# Patient Record
Sex: Male | Born: 1945 | Race: Black or African American | Hispanic: No | Marital: Married | Smoking: Never smoker
Health system: Southern US, Community
[De-identification: ages and names within clinical notes are randomized; demographics above are authoritative.]

## PROBLEM LIST (undated history)

## (undated) DIAGNOSIS — E7800 Pure hypercholesterolemia, unspecified: Secondary | ICD-10-CM

## (undated) DIAGNOSIS — C801 Malignant (primary) neoplasm, unspecified: Secondary | ICD-10-CM

## (undated) DIAGNOSIS — Z8546 Personal history of malignant neoplasm of prostate: Secondary | ICD-10-CM

## (undated) DIAGNOSIS — G4730 Sleep apnea, unspecified: Secondary | ICD-10-CM

## (undated) DIAGNOSIS — E119 Type 2 diabetes mellitus without complications: Secondary | ICD-10-CM

## (undated) DIAGNOSIS — J309 Allergic rhinitis, unspecified: Secondary | ICD-10-CM

## (undated) DIAGNOSIS — E785 Hyperlipidemia, unspecified: Secondary | ICD-10-CM

## (undated) DIAGNOSIS — A070 Balantidiasis: Secondary | ICD-10-CM

## (undated) DIAGNOSIS — I10 Essential (primary) hypertension: Secondary | ICD-10-CM

## (undated) HISTORY — DX: Hyperlipidemia, unspecified: E78.5

## (undated) HISTORY — PX: OTHER SURGICAL HISTORY: SHX169

## (undated) HISTORY — DX: Allergic rhinitis, unspecified: J30.9

## (undated) HISTORY — DX: Personal history of malignant neoplasm of prostate: Z85.46

## (undated) HISTORY — DX: Essential (primary) hypertension: I10

## (undated) HISTORY — DX: Pure hypercholesterolemia, unspecified: E78.00

## (undated) HISTORY — DX: Type 2 diabetes mellitus without complications: E11.9

## (undated) HISTORY — DX: Balantidiasis: A07.0

## (undated) HISTORY — DX: Sleep apnea, unspecified: G47.30

## (undated) HISTORY — DX: Malignant (primary) neoplasm, unspecified: C80.1

---

## 2000-05-19 ENCOUNTER — Ambulatory Visit (HOSPITAL_BASED_OUTPATIENT_CLINIC_OR_DEPARTMENT_OTHER): Admission: RE | Admit: 2000-05-19 | Discharge: 2000-05-19 | Payer: Self-pay | Admitting: Internal Medicine

## 2000-05-20 ENCOUNTER — Emergency Department (HOSPITAL_COMMUNITY): Admission: EM | Admit: 2000-05-20 | Discharge: 2000-05-20 | Payer: Self-pay | Admitting: Emergency Medicine

## 2000-05-20 ENCOUNTER — Encounter: Payer: Self-pay | Admitting: Emergency Medicine

## 2002-10-26 ENCOUNTER — Ambulatory Visit (HOSPITAL_COMMUNITY): Admission: RE | Admit: 2002-10-26 | Discharge: 2002-10-26 | Payer: Self-pay | Admitting: Family Medicine

## 2002-10-26 ENCOUNTER — Encounter: Payer: Self-pay | Admitting: Family Medicine

## 2004-04-01 ENCOUNTER — Emergency Department (HOSPITAL_COMMUNITY): Admission: EM | Admit: 2004-04-01 | Discharge: 2004-04-01 | Payer: Self-pay | Admitting: *Deleted

## 2004-04-08 ENCOUNTER — Emergency Department (HOSPITAL_COMMUNITY): Admission: EM | Admit: 2004-04-08 | Discharge: 2004-04-08 | Payer: Self-pay | Admitting: Family Medicine

## 2004-04-13 ENCOUNTER — Ambulatory Visit (HOSPITAL_COMMUNITY): Admission: RE | Admit: 2004-04-13 | Discharge: 2004-04-13 | Payer: Self-pay | Admitting: Urology

## 2004-05-03 ENCOUNTER — Ambulatory Visit (HOSPITAL_BASED_OUTPATIENT_CLINIC_OR_DEPARTMENT_OTHER): Admission: RE | Admit: 2004-05-03 | Discharge: 2004-05-03 | Payer: Self-pay | Admitting: Family Medicine

## 2004-07-20 ENCOUNTER — Ambulatory Visit (HOSPITAL_COMMUNITY): Admission: RE | Admit: 2004-07-20 | Discharge: 2004-07-20 | Payer: Self-pay | Admitting: Gastroenterology

## 2005-04-12 IMAGING — XA IR RENAL CYST ASP/INJ PROCEDURE
1 series · 2 of 2 positions shown · IV contrast (omnipaque)
Comparison: none

CLINICAL DATA: Symptomatic 8 cm right renal cyst.  Patient referred for aspiration and ablation of the cyst. 
1.  RIGHT RENAL CYST ASPIRATION AND ABLATION WITH CONTRAST INJECTION AND ULTRASOUND-GUIDED NEEDLE PUNCTURE 04/13/04.
2.  TRANSCATHETER RETRIEVAL OF FOREIGN BODY 04/13/04.
Contrast:  20 cc Omnipaque 300 injected into the right renal cyst. 
Medications:  6 mg IV Versed and 150 mcg IV fentanyl given during the procedure for sedation purposes.
The above listed procedures will be included in a single procedural note.
Preliminary review was performed of renal ultrasound studies performed at [REDACTED].  The patient was placed in a prone position and ultrasound localization of the right kidney performed.  The right flank and translumbar region were sterilely prepped and draped.  Local anesthesia was provided with 1% lidocaine.  Under direct ultrasound guidance, an 18 gauge trocar needle was advanced into a dominant right renal cyst with image documentation performed.  The guidewire was then advanced into the cyst and fluoroscopy performed.  The tract was dilated to 8 French, and an 8 French multipurpose locking pigtail catheter was advanced into the cyst.  The cyst was then aspirated completely.  Diluted contrast material was injected under fluoroscopy into the cyst after the cyst was aspirated.  50 cc of absolute ethanol was then injected slowly through the catheter back into the cyst.  The catheter was capped, and the patient was then turned on each side as well as both prone and supine positions for 5 minutes each.  He was then placed in a sitting upright position for 5 minutes.  The patient was then returned to a prone position and alcohol aspirated back through the catheter.  The catheter was then cut and a guidewire advanced.  Part of the catheter was then removed over a guidewire.  
A 10 French sheath was then advanced over the guidewire into the renal cyst.  Two different loop snare devices were utilized in snaring and retrieving broken catheter fragments under fluoroscopy.  Two separate loose catheter fragments were able to be retrieved successfully.  The outer sheath was removed.

[Series 1000: run · 0.13mm/px · 2 of 2 slices shown]
[im 1/2]
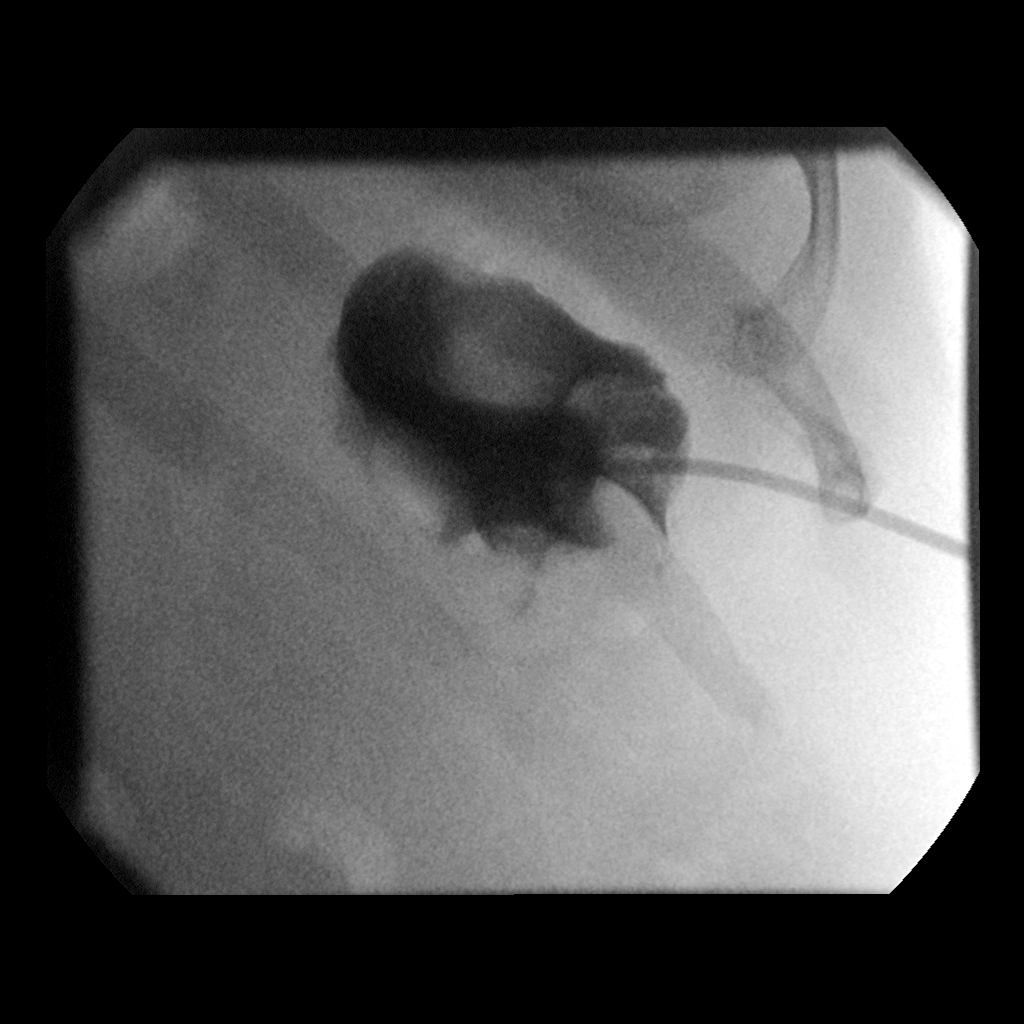
[im 2/2]
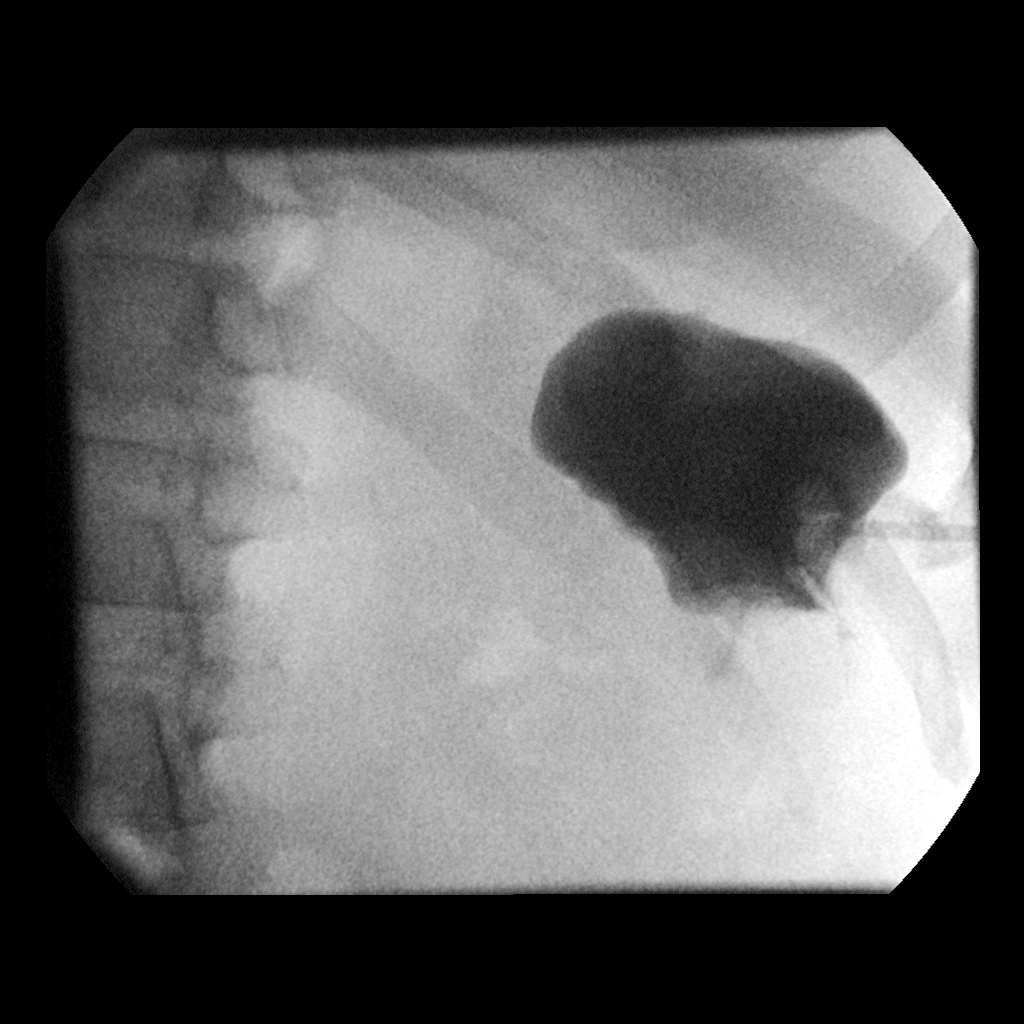

[2 of 2 positions shown; findings below may reference images not displayed]

FINDINGS: Ultrasound shows a dominant cyst of the right kidney which represents the dominant 8 cm cyst seen previously on diagnostic ultrasound.  After a catheter was advanced into the collection, aspiration yielded 150 cc of clear fluid.  Injection of contrast into the cyst shows irregular filling of a confined cavity without communication to the collecting system of the kidney or extravasation into the perinephric tissues.  50 cc of alcohol was therefore chosen to partially replace this volume.  All of the alcohol was able to be aspirated successfully.  When the catheter was removed over a guidewire, it was noted that there was resistance to removing the catheter, and the catheter then ruptured with fragments remaining in the renal cyst.  Two separate catheter fragments were able to be retrieved successfully with loop snare devices through the percutaneous tract.  No further foreign body is present in the cyst.  The patient will recover for three hours after the procedure.  
IMPRESSION
Right renal cyst aspiration and ablation as above.  Cyst volume was 150 cc.  Contrast injection showed no abnormal communication with the collection system or extravasation into perinephric tissues.  Ablation was performed with 50 cc of alcohol.  As above, the catheter placed did break upon attempting to remove it over a guidewire.  This is likely related to weakening of the catheter substance due to the presence of alcohol in the cyst for roughly 20 minutes.  Both catheter fragments were able to be retrieved utilizing snare devices through the percutaneous tract.

## 2006-02-19 ENCOUNTER — Ambulatory Visit: Admission: RE | Admit: 2006-02-19 | Discharge: 2006-04-21 | Payer: Self-pay | Admitting: Radiation Oncology

## 2010-12-30 ENCOUNTER — Encounter: Payer: Self-pay | Admitting: *Deleted

## 2012-10-26 ENCOUNTER — Other Ambulatory Visit: Payer: Self-pay | Admitting: Dermatology

## 2013-01-31 ENCOUNTER — Ambulatory Visit (HOSPITAL_BASED_OUTPATIENT_CLINIC_OR_DEPARTMENT_OTHER): Payer: 59 | Attending: Internal Medicine

## 2013-01-31 DIAGNOSIS — G4737 Central sleep apnea in conditions classified elsewhere: Secondary | ICD-10-CM | POA: Insufficient documentation

## 2013-01-31 DIAGNOSIS — G4733 Obstructive sleep apnea (adult) (pediatric): Secondary | ICD-10-CM | POA: Insufficient documentation

## 2013-02-06 DIAGNOSIS — G4733 Obstructive sleep apnea (adult) (pediatric): Secondary | ICD-10-CM

## 2013-02-06 DIAGNOSIS — R0609 Other forms of dyspnea: Secondary | ICD-10-CM

## 2013-02-06 DIAGNOSIS — G4737 Central sleep apnea in conditions classified elsewhere: Secondary | ICD-10-CM

## 2013-02-06 DIAGNOSIS — R0989 Other specified symptoms and signs involving the circulatory and respiratory systems: Secondary | ICD-10-CM

## 2013-02-06 NOTE — Procedures (Signed)
NAME:  Jason Salazar, Jason Salazar             ACCOUNT NO.:  [id number]  MEDICAL RECORD NO.:  [id number]          PATIENT TYPE:  OUT  LOCATION:  SLEEP CENTER                 FACILITY:  [facility]  PHYSICIAN:  Clinton D. Maple Hudson, MD, FCCP, FACPDATE OF BIRTH:  03-14-1947  DATE OF STUDY:  01/31/2013                           NOCTURNAL POLYSOMNOGRAM  REFERRING PHYSICIAN:  Fleet Contras, M.D.  INDICATION FOR STUDY:  Hypersomnia with sleep apnea.  EPWORTH SLEEPINESS SCORE:  7/24.  BMI 36, weight 210 pounds.  Height 64 inches.  Neck 18 inches.  MEDICATIONS:  Home medications are charted and reviewed.  SLEEP ARCHITECTURE:  Split study protocol.  During the diagnostic phase, total sleep time 127 minutes with sleep efficiency 87.3%.  Stage I was 22.4%, stage II 77.6%, stage III and REM were absent.  Sleep latency 9.5 minutes, awake after sleep onset 9 minutes.  Arousal index 9.  Bedtime Medication:  Veltin, lisinopril, Crestor, Dilantin.  RESPIRATORY DATA:  Split study protocol.  Apnea/hypopnea index (AHI) 123.3 per hour.  A total of 261 events was scored including 66 obstructive apneas, 102 central apneas, 40 mixed apneas, 53 hypopneas. Events were not positional.  CPAP was titrated to 13 CWP, AHI 0 per hour.  He wore a large Eson mask with humidifier.  OXYGEN DATA:  Moderate snoring before CPAP with oxygen desaturation to a nadir of 81% on room air.  With CPAP titration, snoring was prevented and mean oxygen saturation held 94.3% on room air.  CARDIAC DATA:  Normal sinus rhythm.  MOVEMENT/PARASOMNIA:  No significant movement disturbance. Bathroom x1.  IMPRESSION/RECOMMENDATION: 1. 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  Moderate snoring with     oxygen desaturation to a nadir of 81% on room air. 2. Successful CPAP titration to 13 CWP, AHI 0 per hour.  He wore a     large Eson mask with heated humidifier.  Snoring was prevented  and     mean oxygen saturation held at 94.3% on room air.     Clinton D. Maple Hudson, MD, [facility], FACP Diplomate, American Board of Sleep Medicine    CDY/MEDQ  D:  02/06/2013 10:43:33  T:  02/06/2013 22:19:06  Job:  [id number]

## 2013-05-17 ENCOUNTER — Encounter: Payer: Self-pay | Admitting: Internal Medicine

## 2013-05-17 ENCOUNTER — Ambulatory Visit (INDEPENDENT_AMBULATORY_CARE_PROVIDER_SITE_OTHER): Payer: 59 | Admitting: Internal Medicine

## 2013-05-17 VITALS — BP 132/84 | HR 85 | Ht 64.5 in | Wt 213.0 lb

## 2013-05-17 DIAGNOSIS — G4733 Obstructive sleep apnea (adult) (pediatric): Secondary | ICD-10-CM

## 2013-05-17 NOTE — Progress Notes (Signed)
05/17/13- 77 yoM never smoker referred courtesy of Dr.Avbuere because of obstructive sleep apnea. Originally diagnosed over 20 years ago and has had CPAP at least 10 years. Most recent NPSG 01/31/13- severe obstructive sleep apnea-AHI 123.3/ hr, CPAP to 13/ AHI 0. Weight 210 lbs. Currently CPAP is at 13/[facility], using a nasal mask and humidifier. At one time pressure was 15 and he thinks 13 may be too low. He denies daytime sleepiness now. Bedtime between 11 PM and midnight, sleep latency 10-15 minutes, waking once during the night before up between 4 and 6 AM. He denies history of heart or lung disease, or ENT surgery. Had seed implants for prostate cancer. Treated for allergic rhinitis. Brother with obstructive sleep apnea. Mother died with stroke.  Prior to Admission medications   Medication Sig Start Date End Date Taking? Authorizing Provider  aspirin 81 MG tablet Take 81 mg by mouth daily.   Yes Historical Provider, MD  Multiple Vitamin (MULTIVITAMIN) tablet Take 1 tablet by mouth daily.   Yes Historical Provider, MD  DILANTIN 100 MG ER capsule Take 4 capsules by mouth at bedtime. 04/11/13   Historical Provider, MD  glimepiride (AMARYL) 1 MG tablet Take 1 tablet by mouth 3 (three) times daily. 03/20/13   Historical Provider, MD  JANUMET 50-500 MG per tablet Take 1 tablet by mouth 2 (two) times daily. 05/07/13   Historical Provider, MD  lisinopril (PRINIVIL,ZESTRIL) 20 MG tablet Take 1 tablet by mouth daily. 05/07/13   Historical Provider, MD   Past Medical History  Diagnosis Date  . Hypertension   . Hyperlipidemia   . Cancer   . Allergic rhinitis   . Sleep apnea    No past surgical history on file. No family history on file.'  History   Social History  . Marital Status: Married    Spouse Name: N/A    Number of Children: N/A  . Years of Education: N/A   Occupational History  . Not on file.   Social History Main Topics  . Smoking status: Never Smoker   . Smokeless tobacco: Never Used   . Alcohol Use: No  . Drug Use: No  . Sexually Active: Not on file   Other Topics Concern  . Not on file   Social History Narrative  . No narrative on file   ROS-see HPI Constitutional:   No-   weight loss, night sweats, fevers, chills, fatigue, lassitude. HEENT:   No-  headaches, difficulty swallowing, tooth/dental problems, sore throat,       No-  sneezing, itching, ear ache, nasal congestion, post nasal drip,  CV:  No-   chest pain, orthopnea, PND, +swelling in lower extremities, anasarca,                                  dizziness, palpitations Resp: No-   shortness of breath with exertion or at rest.              No-   productive cough,  No non-productive cough,  No- coughing up of blood.              No-   change in color of mucus.  No- wheezing.   Skin: No-   rash or lesions. GI:  No-   heartburn, indigestion, abdominal pain, nausea, vomiting, diarrhea,                 change in bowel habits, loss  of appetite GU: No-   dysuria, change in color of urine, no urgency or frequency.  No- flank pain. MS:  No-   joint pain or swelling.  No- decreased range of motion.  No- back pain. Neuro-     nothing unusual Psych:  No- change in mood or affect. No depression or anxiety.  No memory loss.  OBJ- Physical Exam General- Alert, Oriented, Affect-appropriate, Distress- none acute. + obese Skin- rash-none, lesions- none, excoriation- none Lymphadenopathy- none Head- atraumatic            Eyes- Gross vision intact, PERRLA, conjunctivae and secretions clear            Ears- Hearing, canals-normal            Nose- Clear, no-Septal dev, mucus, polyps, erosion, perforation             Throat- Mallampati IV , mucosa clear , drainage- none, tonsils- atrophic Neck- flexible , trachea midline, no stridor , thyroid nl, carotid no bruit Chest - symmetrical excursion , unlabored           Heart/CV- RRR , no murmur , no gallop  , no rub, nl s1 s2                           - JVD- none , edema-  none, stasis changes- none, varices- none           Lung- clear to P&A, wheeze- none, cough- none , dullness-none, rub- none           Chest wall-  Abd- tender-no, distended-no, bowel sounds-present, HSM- no Br/ Gen/ Rectal- Not done, not indicated Extrem- cyanosis- none, clubbing, none, atrophy- none, strength- nl Neuro- grossly intact to observation

## 2013-05-17 NOTE — Patient Instructions (Addendum)
Order- DME [facility]  CPAP autotitrate 8-20 cwp x 7 days for pressure recommendation               Dx OSA  Please call as needed

## 2013-05-29 ENCOUNTER — Encounter: Payer: Self-pay | Admitting: Internal Medicine

## 2013-05-29 DIAGNOSIS — G4733 Obstructive sleep apnea (adult) (pediatric): Secondary | ICD-10-CM | POA: Insufficient documentation

## 2013-05-29 NOTE — Assessment & Plan Note (Signed)
Good compliance and control. We reviewed basic physiology of sleep apnea, medical concerns and most, and treatments. Plan-weight loss is encouraged. Safe driving his his responsibility as outlined. We will place order to auto titrate CPAP for pressure assessment.

## 2013-06-04 ENCOUNTER — Encounter: Payer: Self-pay | Admitting: Internal Medicine

## 2013-07-06 ENCOUNTER — Ambulatory Visit (INDEPENDENT_AMBULATORY_CARE_PROVIDER_SITE_OTHER): Payer: 59 | Admitting: Internal Medicine

## 2013-07-06 ENCOUNTER — Encounter: Payer: Self-pay | Admitting: Internal Medicine

## 2013-07-06 VITALS — BP 122/76 | HR 89 | Ht 64.5 in | Wt 212.0 lb

## 2013-07-06 DIAGNOSIS — G4733 Obstructive sleep apnea (adult) (pediatric): Secondary | ICD-10-CM

## 2013-07-06 NOTE — Progress Notes (Signed)
05/17/13- 24 yoM never smoker referred courtesy of Dr.Avbuere because of obstructive sleep apnea. Originally diagnosed over 20 years ago and has had CPAP at least 10 years. Most recent NPSG 01/31/13- severe obstructive sleep apnea-AHI 123.3/ hr, CPAP to 13/ AHI 0. Weight 210 lbs. Currently CPAP is at 13/[facility], using a nasal mask and humidifier. At one time pressure was 15 and he thinks 13 may be too low. He denies daytime sleepiness now. Bedtime between 11 PM and midnight, sleep latency 10-15 minutes, waking once during the night before up between 4 and 6 AM. He denies history of heart or lung disease, or ENT surgery. Had seed implants for prostate cancer. Treated for allergic rhinitis. Brother with obstructive sleep apnea. Mother died with stroke.  07-20-2013- 66 yoM never smoker followed for obstructive sleep apnea FOLLOWS FOR: wears CPAP every night for about 6-8 hours and pressure working well for patient; he is able to stay awake at work.  CPAP 13/ [facility] nasal mask and humidifier He realizes weight is a problem and has joined a gym.  ROS-see HPI Constitutional:   No-   weight loss, night sweats, fevers, chills, fatigue, lassitude. HEENT:   No-  headaches, difficulty swallowing, tooth/dental problems, sore throat,       No-  sneezing, itching, ear ache, nasal congestion, post nasal drip,  CV:  No-   chest pain, orthopnea, PND, swelling in lower extremities, anasarca,  dizziness, palpitations Resp: No-   shortness of breath with exertion or at rest.              No-   productive cough,  No non-productive cough,  No- coughing up of blood.              No-   change in color of mucus.  No- wheezing.   Skin: No-   rash or lesions. GI:  No-   heartburn, indigestion, abdominal pain, nausea, vomiting,  GU:  MS:  No-   joint pain or swelling.   Neuro-     nothing unusual Psych:  No- change in mood or affect. No depression or anxiety.  No memory loss.  OBJ- Physical Exam General- Alert, Oriented,  Affect-appropriate, Distress- none acute. + obese/ stocky Skin- rash-none, lesions- none, excoriation- none Lymphadenopathy- none Head- atraumatic            Eyes- Gross vision intact, PERRLA, conjunctivae and secretions clear            Ears- Hearing, canals-normal            Nose- Clear, no-Septal dev, mucus, polyps, erosion, perforation             Throat- Mallampati IV , mucosa clear , drainage- none, tonsils- atrophic Neck- flexible , trachea midline, no stridor , thyroid nl, carotid no bruit Chest - symmetrical excursion , unlabored           Heart/CV- RRR , no murmur , no gallop  , no rub, nl s1 s2                           - JVD- none , edema- none, stasis changes- none, varices- none           Lung- clear to P&A, wheeze- none, cough- none , dullness-none, rub- none           Chest wall-  Abd-  Br/ Gen/ Rectal- Not done, not indicated Extrem- cyanosis- none, clubbing, none, atrophy- none,  strength- nl Neuro- grossly intact to observation

## 2013-07-06 NOTE — Patient Instructions (Addendum)
We can continue CPAP 13/ [facility]  I strongly support your goal of weight loss

## 2013-07-18 ENCOUNTER — Encounter: Payer: Self-pay | Admitting: Internal Medicine

## 2013-07-18 NOTE — Assessment & Plan Note (Signed)
Good start back on CPAP 13/[facility]. We discussed comfort and communication with DME. Agreed that weight loss would help

## 2013-08-18 ENCOUNTER — Encounter: Payer: Self-pay | Admitting: Nurse Practitioner

## 2013-08-23 ENCOUNTER — Other Ambulatory Visit: Payer: Self-pay

## 2013-08-23 ENCOUNTER — Telehealth: Payer: Self-pay | Admitting: *Deleted

## 2013-08-23 MED ORDER — DILANTIN 100 MG PO CAPS: 400.0000 mg | ORAL_CAPSULE | Freq: Every day | ORAL | Status: DC

## 2013-08-23 NOTE — Telephone Encounter (Signed)
Pt emailed and was asking about any lab work and needing a refill.   No labs noted.  Please advise.   Appt next  10-04-13.  I sent email request.

## 2013-08-24 NOTE — Telephone Encounter (Signed)
According to centricity his last visit was October 2013. He can get his yearly labs at next  visit. Doesn't look like Dr. Marjory Lies called results last year.

## 2013-08-25 NOTE — Telephone Encounter (Signed)
I called and spoke to son, Donivan Scull, that refill for dilantin called in , and no labs needed until seen in the office for his RV.   If he has questions can call us back.  He would relay the message.

## 2013-10-04 ENCOUNTER — Encounter: Payer: Self-pay | Admitting: Nurse Practitioner

## 2013-10-04 ENCOUNTER — Encounter: Payer: Self-pay | Admitting: *Deleted

## 2013-10-04 ENCOUNTER — Ambulatory Visit (INDEPENDENT_AMBULATORY_CARE_PROVIDER_SITE_OTHER): Payer: 59 | Admitting: Nurse Practitioner

## 2013-10-04 VITALS — BP 130/73 | Ht 65.0 in | Wt 214.0 lb

## 2013-10-04 DIAGNOSIS — G40209 Localization-related (focal) (partial) symptomatic epilepsy and epileptic syndromes with complex partial seizures, not intractable, without status epilepticus: Secondary | ICD-10-CM

## 2013-10-04 DIAGNOSIS — A070 Balantidiasis: Secondary | ICD-10-CM | POA: Insufficient documentation

## 2013-10-04 DIAGNOSIS — Z8546 Personal history of malignant neoplasm of prostate: Secondary | ICD-10-CM | POA: Insufficient documentation

## 2013-10-04 DIAGNOSIS — E119 Type 2 diabetes mellitus without complications: Secondary | ICD-10-CM | POA: Insufficient documentation

## 2013-10-04 DIAGNOSIS — C801 Malignant (primary) neoplasm, unspecified: Secondary | ICD-10-CM | POA: Insufficient documentation

## 2013-10-04 DIAGNOSIS — E7800 Pure hypercholesterolemia, unspecified: Secondary | ICD-10-CM | POA: Insufficient documentation

## 2013-10-04 DIAGNOSIS — I10 Essential (primary) hypertension: Secondary | ICD-10-CM | POA: Insufficient documentation

## 2013-10-04 DIAGNOSIS — J309 Allergic rhinitis, unspecified: Secondary | ICD-10-CM | POA: Insufficient documentation

## 2013-10-04 DIAGNOSIS — Z5181 Encounter for therapeutic drug level monitoring: Secondary | ICD-10-CM | POA: Insufficient documentation

## 2013-10-04 DIAGNOSIS — Z79899 Other long term (current) drug therapy: Secondary | ICD-10-CM

## 2013-10-04 DIAGNOSIS — G40309 Generalized idiopathic epilepsy and epileptic syndromes, not intractable, without status epilepticus: Secondary | ICD-10-CM | POA: Insufficient documentation

## 2013-10-04 DIAGNOSIS — G4730 Sleep apnea, unspecified: Secondary | ICD-10-CM | POA: Insufficient documentation

## 2013-10-04 DIAGNOSIS — E782 Mixed hyperlipidemia: Secondary | ICD-10-CM | POA: Insufficient documentation

## 2013-10-04 DIAGNOSIS — I119 Hypertensive heart disease without heart failure: Secondary | ICD-10-CM | POA: Insufficient documentation

## 2013-10-04 DIAGNOSIS — E785 Hyperlipidemia, unspecified: Secondary | ICD-10-CM | POA: Insufficient documentation

## 2013-10-04 MED ORDER — DILANTIN 100 MG PO CAPS: 400.0000 mg | ORAL_CAPSULE | Freq: Every day | ORAL | Status: DC

## 2013-10-04 NOTE — Progress Notes (Signed)
[facility]  PATIENT: Jason Salazar DOB: October 05, 1946   REASON FOR VISIT: Followup for seizure disorder   HISTORY OF PRESENT ILLNESS: Ms. Jarnigan, 67 year old black male returns for followup. 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 No  seizures in several years.  He denies any missed doses of his medication. His seizure disorder began at age 7 following a head injury. He denies any side effects to his Dilantin. He denies any falls. He denies any daytime drowsiness. He also is diabetic but says his blood sugars are in good control. Last HgbA1C was 7.4.He gets no regular exercise. He also has a history of high blood pressure and sleep disorder for which he uses CPAP at 13 cm of pressure. Recent diagnosis of gout  REVIEW OF SYSTEMS: Full 14 system review of systems performed and notable only for:  Constitutional: Weight gain Cardiovascular: N/A  Ear/Nose/Throat: N/A  Skin: N/A  Eyes: N/A  Respiratory: Cough  Gastroitestinal: N/A  Hematology/Lymphatic: N/A  Endocrine: N/A Musculoskeletal:N/A  Allergy/Immunology: Allergies Neurological: N/A Psychiatric: N/A   ALLERGIES: No Known Allergies  HOME MEDICATIONS: Outpatient Prescriptions Prior to Visit  Medication Sig Dispense Refill  . aspirin 81 MG tablet Take 81 mg by mouth daily.      . CRESTOR 20 MG tablet Take 1 tablet by mouth every evening.      [name] DILANTIN 100 MG ER capsule Take 4 capsules (400 mg total) by mouth at bedtime.  120 capsule  1  . fluticasone (FLONASE) 50 MCG/ACT nasal spray       . glimepiride (AMARYL) 1 MG tablet Take 1 po QAM and 2 po QPM      . JANUMET 50-500 MG per tablet Take 1 tablet by mouth 2 (two) times daily.      [name] lisinopril (PRINIVIL,ZESTRIL) 20 MG tablet Take 1 tablet by mouth daily.      . Multiple Vitamin (MULTIVITAMIN) tablet Take 1 tablet by mouth daily.       No  facility-administered medications prior to visit.    PAST MEDICAL HISTORY: Past Medical History  Diagnosis Date  . Hypertension   . Hyperlipidemia   . Cancer   . Allergic rhinitis   . Sleep apnea   . H/O prostate cancer     Radioactive seeds    PAST SURGICAL HISTORY: History reviewed. No pertinent past surgical history.  FAMILY HISTORY: Family History  Problem Relation Age of Onset  . CVA Mother   . Sleep apnea Brother     SOCIAL HISTORY: History   Social History  . Marital Status: Married    Spouse Name: N/A    Number of Children: N/A  . Years of Education: N/A   Occupational History  . Not on file.   Social History Main Topics  . Smoking status: Never Smoker   . Smokeless tobacco: Never Used  . Alcohol Use: No  . Drug Use: No  . Sexual Activity: Not on file   Other Topics Concern  . Not on file   Social History Narrative   Patient is married to [place] and has one son.            PHYSICAL EXAM  Filed Vitals:   10/04/13 0901  BP: 130/73  Height: 5\' 5"  (1.651 m)  Weight: 214 lb (97.07 kg)   Body mass index is 35.61 kg/(m^2).  Generalized: Well developed, obese male  in no acute distress   Neurological examination   Mentation: Alert oriented to time, place, history taking. Follows all commands speech and language fluent  Cranial nerve II-XII: Pupils were equal round reactive to light extraocular movements were full, visual field were full on confrontational test. Facial sensation and strength were normal. hearing was intact to finger rubbing bilaterally. Uvula tongue midline. head turning and shoulder shrug and were normal and symmetric.Tongue protrusion into cheek strength was normal. Motor: normal bulk and tone, full strength in the BUE, BLE, fine finger movements normal, no pronator drift. No focal weakness Coordination: finger-nose-finger, heel-to-shin bilaterally, no dysmetria Reflexes: Brachioradialis 2/2, biceps 2/2, triceps 2/2,  patellar 2/2, Achilles 2/2, plantar responses were flexor bilaterally. Gait and Station: Rising up from seated position without assistance, normal stance, without trunk ataxia, moderate stride, good arm swing, smooth turning, able to perform tiptoe, and heel walking without difficulty. Tandem gait steady DIAGNOSTIC DATA (LABS, IMAGING, TESTING) -None to review  ASSESSMENT AND PLAN  67 y.o. year old male  has a past medical history of partial seizure disorder due to head trauma in the past with no seizures in several years. Patient is currently on Dilantin 400 mg daily without side effects.   Recent labs by Dr. Concepcion Elk, will check Dilantin level Given co pay card for BRAND Dilantin F/U yearly and prn  Nilda Riggs, [facility], [facility], APRN  [facility] [address] [place], [place] [address] [phone]

## 2013-10-04 NOTE — Patient Instructions (Signed)
Recent labs by Dr. Concepcion Elk, will check Dilantin level Given co pay card for BRAND Dilantin F/U yearly and prn

## 2013-10-05 ENCOUNTER — Ambulatory Visit (INDEPENDENT_AMBULATORY_CARE_PROVIDER_SITE_OTHER): Payer: 59 | Admitting: Interventional Cardiology

## 2013-10-05 ENCOUNTER — Encounter: Payer: Self-pay | Admitting: Interventional Cardiology

## 2013-10-05 VITALS — BP 150/74 | HR 91 | Ht 63.0 in | Wt 213.0 lb

## 2013-10-05 DIAGNOSIS — I10 Essential (primary) hypertension: Secondary | ICD-10-CM

## 2013-10-05 DIAGNOSIS — R0600 Dyspnea, unspecified: Secondary | ICD-10-CM | POA: Insufficient documentation

## 2013-10-05 DIAGNOSIS — G4733 Obstructive sleep apnea (adult) (pediatric): Secondary | ICD-10-CM

## 2013-10-05 DIAGNOSIS — R0609 Other forms of dyspnea: Secondary | ICD-10-CM

## 2013-10-05 DIAGNOSIS — E785 Hyperlipidemia, unspecified: Secondary | ICD-10-CM

## 2013-10-05 NOTE — Patient Instructions (Addendum)
Your physician recommends that you continue on your current medications as directed. Please refer to the Current Medication list given to you today.  Lab today: BNP  Your physician has requested that you have an echocardiogram. Echocardiography is a painless test that uses sound waves to create images of your heart. It provides your doctor with information about the size and shape of your heart and how well your heart's chambers and valves are working. This procedure takes approximately one hour. There are no restrictions for this procedure.

## 2013-10-05 NOTE — Progress Notes (Signed)
Patient ID: Jason Salazar, male   DOB: [birth year], 67 y.o.   MRN: [id number]   Date: 10/05/2013 ID: Jason Salazar, DOB 1946/04/23, MRN [id number] PCP: Dorrene German, MD  Reason: Peripheral edema, right lower extremity greater than left  ASSESSMENT;  1. Severe hypertension with poor control over the years. Currently reasonably well controlled  2. Right greater than left lower extremity edema 3. History of prostate cancer  PLAN:  1. 2-D Doppler echocardiogram 2. BNP   SUBJECTIVE: Jason Salazar is a 67 y.o. male who is concerned about right lower extremity swelling and dyspnea on exertion. He feels the dyspnea may be related to deconditioning. Right lower extremity edema worsens as the day progresses. It has resolved by the beginning of each morning. He denies orthopnea, PND, chest discomfort, and syncope. He has not felt palpitations. There no neurological complaints. He advocates medication compliance. He has gained weight. He is careful with salt in his diet. No history of DVT or pulmonary emboli.   No Known Allergies  Current Outpatient Prescriptions on File Prior to Visit  Medication Sig Dispense Refill  . allopurinol (ZYLOPRIM) 100 MG tablet       . aspirin 81 MG tablet Take 81 mg by mouth daily.      . CRESTOR 20 MG tablet Take 1 tablet by mouth every evening.      [name] DILANTIN 100 MG ER capsule Take 4 capsules (400 mg total) by mouth at bedtime.  120 capsule  11  . fluticasone (FLONASE) 50 MCG/ACT nasal spray       . glimepiride (AMARYL) 1 MG tablet Take 1 po QAM and 2 po QPM      . JANUMET 50-500 MG per tablet Take 1 tablet by mouth 2 (two) times daily.      [name] lisinopril (PRINIVIL,ZESTRIL) 20 MG tablet Take 1 tablet by mouth daily.      . Multiple Vitamin (MULTIVITAMIN) tablet Take 1 tablet by mouth daily.      [name] PROAIR HFA 108 (90 BASE) MCG/ACT inhaler       . TRAVATAN Z 0.004 % SOLN ophthalmic solution        No current facility-administered medications on file  prior to visit.    Past Medical History  Diagnosis Date  . Hypertension   . Hyperlipidemia   . Cancer     prostate cancer treated 2007 with radiation seed placement  . Allergic rhinitis   . Sleep apnea   . H/O prostate cancer     Radioactive seeds  . Diabetes   . Balantidiasis   . Hyperlipoproteinemia   . Hypercholesteremia     Past Surgical History  Procedure Laterality Date  . Cyst removed from right kidney      History   Social History  . Marital Status: Married    Spouse Name: N/A    Number of Children: N/A  . Years of Education: N/A   Occupational History  . Not on file.   Social History Main Topics  . Smoking status: Never Smoker   . Smokeless tobacco: Never Used  . Alcohol Use: No  . Drug Use: No  . Sexual Activity: Not on file   Other Topics Concern  . Not on file   Social History Narrative   Patient is married to Jason Salazar and has one son.           Family History  Problem Relation Age of Onset  . CVA Mother   . Sleep apnea Brother  ROS: No transient neurological symptoms, headache, blood in urine or stool, abdominal pain, syncope, wheezing, or hemoptysis.. Other systems negative for complaints.  OBJECTIVE: BP 150/74  Pulse 91  Ht 5\' 3"  (1.6 m)  Wt 213 lb (96.616 kg)  BMI 37.74 kg/m2,  General: No acute distress, obese, marked abdominal obesity HEENT: normal  Neck: JVD absent. Carotids absent Chest: Clear to auscultation and percussion Cardiac: Murmur: 2 of 6 systolic murmur left lower sternal border. Gallop: S4 gallop. Rhythm: Regular. Other: None Abdomen: Bruit: Absent. Pulsation: Absent. Liver and spleen not palpable Extremities: Edema: 1+ right lower extremity ankle edema. Pulses: 2+ and symmetric bilateral Neuro: Normal Psych: Normal  ECG: Biatrial abnormality, sinus rhythm, left ventricular hypertrophy

## 2013-10-06 ENCOUNTER — Telehealth: Payer: Self-pay

## 2013-10-06 LAB — BRAIN NATRIURETIC PEPTIDE: Pro B Natriuretic peptide (BNP): 23 pg/mL (ref 0.0–100.0)

## 2013-10-06 NOTE — Telephone Encounter (Signed)
Message copied by Jarvis Newcomer on Wed Oct 06, 2013  3:51 PM ------      Message from: Verdis Prime      Created: Wed Oct 06, 2013  2:49 PM       The blood test is normal, confirming that there is no evidence of fluid buildup in the lungs related to the heart. This also leads me to believe that the edema in the leg is not related to the heart. We will see the echo results and call with findings when available ------

## 2013-10-06 NOTE — Telephone Encounter (Signed)
called to give pt lab results lmom for pt to return call 

## 2013-10-07 NOTE — Progress Notes (Signed)
Quick Note:  Shared dilantin level results and dose instruction below with patient thru VM message. ______

## 2013-10-08 NOTE — Telephone Encounter (Signed)
2nd attempt lmom pt lab ok

## 2013-10-08 NOTE — Telephone Encounter (Signed)
returned pt call pt aware of labs.The blood test is normal. pt is to have echo we will call him with results.pt verbalized understanding

## 2013-10-08 NOTE — Telephone Encounter (Signed)
Message copied by Jarvis Newcomer on Fri Oct 08, 2013  8:57 AM ------      Message from: Verdis Prime      Created: Wed Oct 06, 2013  2:49 PM       The blood test is normal, confirming that there is no evidence of fluid buildup in the lungs related to the heart. This also leads me to believe that the edema in the leg is not related to the heart. We will see the echo results and call with findings when available ------

## 2013-10-08 NOTE — Telephone Encounter (Signed)
Message copied by Jarvis Newcomer on Fri Oct 08, 2013  8:50 AM ------      Message from: Verdis Prime      Created: Wed Oct 06, 2013  2:49 PM       The blood test is normal, confirming that there is no evidence of fluid buildup in the lungs related to the heart. This also leads me to believe that the edema in the leg is not related to the heart. We will see the echo results and call with findings when available ------

## 2013-10-08 NOTE — Telephone Encounter (Signed)
°  Patient is returning your call. Please call patient @ [phone]

## 2013-10-14 ENCOUNTER — Other Ambulatory Visit: Payer: Self-pay

## 2013-10-19 ENCOUNTER — Encounter: Payer: Self-pay | Admitting: Cardiology

## 2013-10-19 ENCOUNTER — Ambulatory Visit (HOSPITAL_COMMUNITY): Payer: 59 | Attending: Cardiology | Admitting: Radiology

## 2013-10-19 DIAGNOSIS — I10 Essential (primary) hypertension: Secondary | ICD-10-CM

## 2013-10-19 DIAGNOSIS — R0602 Shortness of breath: Secondary | ICD-10-CM

## 2013-10-19 DIAGNOSIS — G4730 Sleep apnea, unspecified: Secondary | ICD-10-CM | POA: Insufficient documentation

## 2013-10-19 DIAGNOSIS — R609 Edema, unspecified: Secondary | ICD-10-CM | POA: Insufficient documentation

## 2013-10-19 DIAGNOSIS — R0600 Dyspnea, unspecified: Secondary | ICD-10-CM

## 2013-10-19 DIAGNOSIS — E119 Type 2 diabetes mellitus without complications: Secondary | ICD-10-CM | POA: Insufficient documentation

## 2013-10-19 DIAGNOSIS — E785 Hyperlipidemia, unspecified: Secondary | ICD-10-CM | POA: Insufficient documentation

## 2013-10-19 NOTE — Progress Notes (Signed)
Echocardiogram performed.  

## 2013-10-22 ENCOUNTER — Other Ambulatory Visit: Payer: Self-pay | Admitting: *Deleted

## 2013-10-22 DIAGNOSIS — I10 Essential (primary) hypertension: Secondary | ICD-10-CM

## 2013-10-22 MED ORDER — LISINOPRIL-HYDROCHLOROTHIAZIDE 20-12.5 MG PO TABS: 1.0000 | ORAL_TABLET | Freq: Every day | ORAL | Status: DC

## 2013-10-22 NOTE — Telephone Encounter (Signed)
Pt notified of Echo results & new order to change lisinopril to lisinopril/HCT 20mg /12.5mg  oral daily plus return appointment w Dr Katrinka Blazing for f/u & BMET 11/23/13 @ 1115.

## 2013-11-18 NOTE — Progress Notes (Signed)
I reviewed note and agree with plan.   VIKRAM R. PENUMALLI, MD  Certified in Neurology, Neurophysiology and Neuroimaging  [facility] [address], [address] [place], [place] [address] [phone]   

## 2013-11-23 ENCOUNTER — Encounter: Payer: Self-pay | Admitting: Interventional Cardiology

## 2013-11-23 ENCOUNTER — Ambulatory Visit (INDEPENDENT_AMBULATORY_CARE_PROVIDER_SITE_OTHER): Payer: 59 | Admitting: Interventional Cardiology

## 2013-11-23 ENCOUNTER — Other Ambulatory Visit (INDEPENDENT_AMBULATORY_CARE_PROVIDER_SITE_OTHER): Payer: 59

## 2013-11-23 VITALS — BP 147/70 | HR 100 | Ht 62.0 in | Wt 204.0 lb

## 2013-11-23 DIAGNOSIS — I10 Essential (primary) hypertension: Secondary | ICD-10-CM

## 2013-11-23 DIAGNOSIS — E7800 Pure hypercholesterolemia, unspecified: Secondary | ICD-10-CM

## 2013-11-23 DIAGNOSIS — G4733 Obstructive sleep apnea (adult) (pediatric): Secondary | ICD-10-CM

## 2013-11-23 LAB — BASIC METABOLIC PANEL
Calcium: 9 mg/dL (ref 8.4–10.5)
Chloride: 101 mEq/L (ref 96–112)
Creatinine, Ser: 1.3 mg/dL (ref 0.4–1.5)
GFR: 72.97 mL/min (ref >60.00)
Glucose, Bld: 165 mg/dL — ABNORMAL HIGH (ref 70–99)
Potassium: 4.5 mEq/L (ref 3.5–5.1)
Sodium: 136 mEq/L (ref 135–145)

## 2013-11-23 NOTE — Patient Instructions (Signed)
Your physician recommends that you continue on your current medications as directed. Please refer to the Current Medication list given to you today.  Your physician wants you to follow-up in: 6 months You will receive a reminder letter in the mail two months in advance. If you don't receive a letter, please call our office to schedule the follow-up appointment.   2 Gram Low Sodium Diet A 2 gram sodium diet restricts the amount of sodium in the diet to no more than 2 g or 2000 mg daily. Limiting the amount of sodium is often used to help lower blood pressure. It is important if you have heart, liver, or kidney problems. Many foods contain sodium for flavor and sometimes as a preservative. When the amount of sodium in a diet needs to be low, it is important to know what to look for when choosing foods and drinks. The following includes some information and guidelines to help make it easier for you to adapt to a low sodium diet. QUICK TIPS  Do not add salt to food.  Avoid convenience items and fast food.  Choose unsalted snack foods.  Buy lower sodium products, often labeled as "lower sodium" or "no salt added."  Check food labels to learn how much sodium is in 1 serving.  When eating at a restaurant, ask that your food be prepared with less salt or none, if possible. READING FOOD LABELS FOR SODIUM INFORMATION The nutrition facts label is a good place to find how much sodium is in foods. Look for products with no more than 500 to 600 mg of sodium per meal and no more than 150 mg per serving. Remember that 2 g = 2000 mg. The food label may also list foods as:  Sodium-free: Less than 5 mg in a serving.  Very low sodium: 35 mg or less in a serving.  Low-sodium: 140 mg or less in a serving.  Light in sodium: 50% less sodium in a serving. For example, if a food that usually has 300 mg of sodium is changed to become light in sodium, it will have 150 mg of sodium.  Reduced sodium: 25% less  sodium in a serving. For example, if a food that usually has 400 mg of sodium is changed to reduced sodium, it will have 300 mg of sodium. CHOOSING FOODS Grains  Avoid: Salted crackers and snack items. Some cereals, including instant hot cereals. Bread stuffing and biscuit mixes. Seasoned rice or pasta mixes.  Choose: Unsalted snack items. Low-sodium cereals, oats, puffed wheat and rice, shredded wheat. English muffins and bread. Pasta. Meats  Avoid: Salted, canned, smoked, spiced, pickled meats, including fish and poultry. Bacon, ham, sausage, cold cuts, hot dogs, anchovies.  Choose: Low-sodium canned tuna and salmon. Fresh or frozen meat, poultry, and fish. Dairy  Avoid: Processed cheese and spreads. Cottage cheese. Buttermilk and condensed milk. Regular cheese.  Choose: Milk. Low-sodium cottage cheese. Yogurt. Sour cream. Low-sodium cheese. Fruits and Vegetables  Avoid: Regular canned vegetables. Regular canned tomato sauce and paste. Frozen vegetables in sauces. Olives. Pickles. Relishes. Sauerkraut.  Choose: Low-sodium canned vegetables. Low-sodium tomato sauce and paste. Frozen or fresh vegetables. Fresh and frozen fruit. Condiments  Avoid: Canned and packaged gravies. Worcestershire sauce. Tartar sauce. Barbecue sauce. Soy sauce. Steak sauce. Ketchup. Onion, garlic, and table salt. Meat flavorings and tenderizers.  Choose: Fresh and dried herbs and spices. Low-sodium varieties of mustard and ketchup. Lemon juice. Tabasco sauce. Horseradish. SAMPLE 2 GRAM SODIUM MEAL PLAN Breakfast / Sodium (  mg)  1 cup low-fat milk / 143 mg  2 slices whole-wheat toast / 270 mg  1 tbs heart-healthy margarine / 153 mg  1 hard-boiled egg / 139 mg  1 small orange / 0 mg Lunch / Sodium (mg)  1 cup raw carrots / 76 mg   cup hummus / 298 mg  1 cup low-fat milk / 143 mg   cup red grapes / 2 mg  1 whole-wheat pita bread / 356 mg Dinner / Sodium (mg)  1 cup whole-wheat pasta / 2  mg  1 cup low-sodium tomato sauce / 73 mg  3 oz lean ground beef / 57 mg  1 small side salad (1 cup raw spinach leaves,  cup cucumber,  cup yellow bell pepper) with 1 tsp olive oil and 1 tsp red wine vinegar / 25 mg Snack / Sodium (mg)  1 container low-fat vanilla yogurt / 107 mg  3 graham cracker squares / 127 mg Nutrient Analysis  Calories: 2033  Protein: 77 g  Carbohydrate: 282 g  Fat: 72 g  Sodium: 1971 mg Document Released: 11/25/2005 Document Revised: 02/17/2012 Document Reviewed: 02/26/2010 [employer] Patient Information 2014 [employer].  

## 2013-11-23 NOTE — Progress Notes (Signed)
Patient ID: Jason Salazar, male   DOB: [birth year], 67 y.o.   MRN: [id number]    [address] [place], [place]  [address] Phone: [phone] Fax:  [phone]  Date:  11/23/2013   ID:  Jason Salazar, DOB July 29, 1946, MRN [id number]  PCP:  Dorrene German, MD   ASSESSMENT:  1. Hypertension, poorly controlled but has not had his medications yet today 2. Chronic diastolic heart failure with dyspnea, improved after intensification of antihypertensive regimen. Heart rate is elevated today.  3. Obesity   PLAN:  1. continue current medical regimen 2. The blood pressure remains above 140 systolic or heart rate continues from around 100 beats per minute LAD low-dose beta blocker therapy 3. Clinical followup in 6 months   SUBJECTIVE: Jason Salazar is a 67 y.o. male who states that there has been a dramatic improvement in exertional tolerance and dyspnea on exertion. He has lost 10 pounds since starting low-dose hydrochlorothiazide. He denies chest pain. There is been no orthostatic dizziness. He denies peripheral edema, cramping, and gallop. Had previously used diuretic therapy but was discontinued because of gout.   Wt Readings from Last 3 Encounters:  11/23/13 204 lb (92.534 kg)  10/05/13 213 lb (96.616 kg)  10/04/13 214 lb (97.07 kg)     Past Medical History  Diagnosis Date  . Hypertension   . Hyperlipidemia   . Cancer     prostate cancer treated 2007 with radiation seed placement  . Allergic rhinitis   . Sleep apnea   . H/O prostate cancer     Radioactive seeds  . Diabetes   . Balantidiasis   . Hyperlipoproteinemia   . Hypercholesteremia     Current Outpatient Prescriptions  Medication Sig Dispense Refill  . allopurinol (ZYLOPRIM) 100 MG tablet       . aspirin 81 MG tablet Take 81 mg by mouth daily.      . CRESTOR 20 MG tablet Take 1 tablet by mouth every evening.      [name] DILANTIN 100 MG ER capsule Take 4 capsules (400 mg total) by mouth at  bedtime.  120 capsule  11  . fluticasone (FLONASE) 50 MCG/ACT nasal spray       . glimepiride (AMARYL) 1 MG tablet Take 1 po QAM and 2 po QPM      . JANUMET 50-500 MG per tablet Take 1 tablet by mouth 2 (two) times daily.      [name] lisinopril-hydrochlorothiazide (PRINZIDE,ZESTORETIC) 20-12.5 MG per tablet Take 1 tablet by mouth daily.  30 tablet  6  . Multiple Vitamin (MULTIVITAMIN) tablet Take 1 tablet by mouth daily.      [name] PROAIR HFA 108 (90 BASE) MCG/ACT inhaler       . TRAVATAN Z 0.004 % SOLN ophthalmic solution        No current facility-administered medications for this visit.    Allergies:   No Known Allergies  Social History:  The patient  reports that he has never smoked. He has never used smokeless tobacco. He reports that he does not drink alcohol or use illicit drugs.   ROS:  Please see the history of present illness.   All other systems reviewed and negative.   OBJECTIVE: VS:  BP 147/70  Pulse 100  Ht 5\' 2"  (1.575 m)  Wt 204 lb (92.534 kg)  BMI 37.30 kg/m2 Well nourished, well developed, in no acute distress, obese  HEENT: normal Neck: JVD flat. Carotid bruit absent  Cardiac:  normal S1,  S2; RRR; no murmur Lungs:  clear to auscultation bilaterally, no wheezing, rhonchi or rales Abd: soft, nontender, no hepatomegaly Ext: Edema absent. Pulses 2+  Skin: warm and dry Neuro:  CNs 2-12 intact, no focal abnormalities noted  EKG:  Not repeated       Signed, Darci Needle III, MD 11/23/2013 11:33 AM

## 2013-11-24 ENCOUNTER — Telehealth: Payer: Self-pay

## 2013-11-24 NOTE — Telephone Encounter (Signed)
pt given lab results.  Potassium and kidney function are normal after the medication adjustment. Continue the same therapy. pt verbalized understanding.

## 2013-11-24 NOTE — Telephone Encounter (Signed)
Message copied by Jarvis Newcomer on Wed Nov 24, 2013  3:56 PM ------      Message from: Verdis Prime      Created: Tue Nov 23, 2013  5:25 PM       Potassium and kidney function are normal after the medication adjustment. Continue the same therapy. ------

## 2014-01-06 ENCOUNTER — Encounter: Payer: Self-pay | Admitting: Internal Medicine

## 2014-01-06 ENCOUNTER — Ambulatory Visit (INDEPENDENT_AMBULATORY_CARE_PROVIDER_SITE_OTHER): Payer: 59 | Admitting: Internal Medicine

## 2014-01-06 VITALS — BP 110/58 | HR 99 | Ht 65.0 in | Wt 206.0 lb

## 2014-01-06 DIAGNOSIS — G4733 Obstructive sleep apnea (adult) (pediatric): Secondary | ICD-10-CM

## 2014-01-06 NOTE — Patient Instructions (Signed)
We can continue CPAP 13/ [facility]  Please call as needed

## 2014-01-06 NOTE — Progress Notes (Signed)
05/17/13- 33 yoM never smoker referred courtesy of Dr.Avbuere because of obstructive sleep apnea. Originally diagnosed over 20 years ago and has had CPAP at least 10 years. Most recent NPSG 01/31/13- severe obstructive sleep apnea-AHI 123.3/ hr, CPAP to 13/ [facility] 0. Weight 210 lbs. Currently CPAP is at 13/[facility], using a nasal mask and humidifier. At one time pressure was 15 and he thinks 13 may be too low. He denies daytime sleepiness now. Bedtime between 11 PM and midnight, sleep latency 10-15 minutes, waking once during the night before up between 4 and 6 AM. He denies history of heart or lung disease, or ENT surgery. Had seed implants for prostate cancer. Treated for allergic rhinitis. Brother with obstructive sleep apnea. Mother died with stroke.  01-Aug-2013- 17 yoM never smoker followed for obstructive sleep apnea FOLLOWS FOR: wears CPAP every night for about 6-8 hours and pressure working well for patient; he is able to stay awake at work.  CPAP 13/ [facility] nasal mask and humidifier He realizes weight is a problem and has joined a gym.  01/06/14- 67 yoM never smoker followed for obstructive sleep apnea CPAP 13/ [facility] nasal mask and humidifier FOLLOWS FOR:  Wearing CPAP 13/ [facility]  6-8 per night--no complaints and has received new supplies Just got new mask.  ROS-see HPI Constitutional:   No-   weight loss, night sweats, fevers, chills, fatigue, lassitude. HEENT:   No-  headaches, difficulty swallowing, tooth/dental problems, sore throat,       No-  sneezing, itching, ear ache, nasal congestion, post nasal drip,  CV:  No-   chest pain, orthopnea, PND, swelling in lower extremities, anasarca,  dizziness, palpitations Resp: No-   shortness of breath with exertion or at rest.              No-   productive cough,  No non-productive cough,  No- coughing up of blood.              No-   change in color of mucus.  No- wheezing.   Skin: No-   rash or lesions. GI:  No-   heartburn, indigestion, abdominal  pain, nausea, vomiting,  GU:  MS:  No-   joint pain or swelling.   Neuro-     nothing unusual Psych:  No- change in mood or affect. No depression or anxiety.  No memory loss.  OBJ- Physical Exam General- Alert, Oriented, Affect-appropriate, Distress- none acute. + obese/ stocky Skin- rash-none, lesions- none, excoriation- none Lymphadenopathy- none Head- atraumatic            Eyes- Gross vision intact, PERRLA, conjunctivae and secretions clear            Ears- Hearing, canals-normal            Nose- Clear, no-Septal dev, mucus, polyps, erosion, perforation             Throat- Mallampati IV , mucosa clear , drainage- none, tonsils- atrophic Neck- flexible , trachea midline, no stridor , thyroid nl, carotid no bruit Chest - symmetrical excursion , unlabored           Heart/CV- RRR , no murmur , no gallop  , no rub, nl s1 s2                           - JVD- none , edema- none, stasis changes- none, varices- none           Lung- clear  to P&A, wheeze- none, cough- none , dullness-none, rub- none           Chest wall-  Abd-  Br/ Gen/ Rectal- Not done, not indicated Extrem- cyanosis- none, clubbing, none, atrophy- none, strength- nl Neuro- grossly intact to observation

## 2014-01-17 ENCOUNTER — Telehealth: Payer: Self-pay | Admitting: Interventional Cardiology

## 2014-01-17 NOTE — Telephone Encounter (Signed)
New Prob   Pt has some questions regarding his BP. He states his BP was elevated this weekend, but says it has now resolved. He would like to speak to nurse regarding his concerns. Please call.

## 2014-01-17 NOTE — Telephone Encounter (Signed)
returned pt call .lmom for pt to call the office.

## 2014-01-17 NOTE — Telephone Encounter (Signed)
pt called pt sts that on Fri he went out to dinner and had a eal that was he believed to be high in sodium.pt sts that his bp had increased to 206/93.pt sts that he experinced some tingling in his left arm and hand. and thought he was having an onset of epileptic episode.pt sts that the tingling went away after 20 min.pt sts that his bp has gone back to baseline 120's/70's.pt adv to let us know if he has a reoccurrence of those symptoms.pt verbalized understanding

## 2014-01-24 ENCOUNTER — Telehealth: Payer: Self-pay | Admitting: Interventional Cardiology

## 2014-01-24 NOTE — Telephone Encounter (Signed)
New message     Patients bp is elevated.  No dizziness or headaches.  Pt has not missed any medication.  Wife called but want nurse to call her husband [phone]

## 2014-01-26 NOTE — Telephone Encounter (Signed)
called to ck on pt status.lmom for pt to call back

## 2014-01-30 NOTE — Assessment & Plan Note (Signed)
Good compliance and control with CPAP 13/[facility] Plan-contact [facility] for download for our record, reminder basic sleep hygiene

## 2014-02-10 NOTE — Telephone Encounter (Signed)
lmom. 2nd attempt called to see how pt was doing.

## 2014-02-11 ENCOUNTER — Telehealth: Payer: Self-pay | Admitting: Interventional Cardiology

## 2014-02-11 NOTE — Telephone Encounter (Signed)
Follow up ° ° ° ° ° °Returned Jason Salazar's call ° °

## 2014-02-11 NOTE — Telephone Encounter (Signed)
3rd attempt lmom for pt to call back with an update of how he is doing.

## 2014-02-11 NOTE — Telephone Encounter (Signed)
New message ° ° °Returning call back to nurse from yesterday.   °

## 2014-02-19 ENCOUNTER — Emergency Department (HOSPITAL_COMMUNITY): Payer: 59

## 2014-02-19 ENCOUNTER — Encounter (HOSPITAL_COMMUNITY): Payer: Self-pay | Admitting: Emergency Medicine

## 2014-02-19 ENCOUNTER — Emergency Department (HOSPITAL_COMMUNITY)
Admission: EM | Admit: 2014-02-19 | Discharge: 2014-02-19 | Disposition: A | Discharge status: Home / Self Care | Payer: 59 | Attending: Emergency Medicine | Admitting: Emergency Medicine

## 2014-02-19 DIAGNOSIS — R200 Anesthesia of skin: Secondary | ICD-10-CM

## 2014-02-19 DIAGNOSIS — I10 Essential (primary) hypertension: Secondary | ICD-10-CM | POA: Insufficient documentation

## 2014-02-19 DIAGNOSIS — Z8619 Personal history of other infectious and parasitic diseases: Secondary | ICD-10-CM | POA: Insufficient documentation

## 2014-02-19 DIAGNOSIS — Z79899 Other long term (current) drug therapy: Secondary | ICD-10-CM | POA: Insufficient documentation

## 2014-02-19 DIAGNOSIS — E785 Hyperlipidemia, unspecified: Secondary | ICD-10-CM | POA: Insufficient documentation

## 2014-02-19 DIAGNOSIS — E7800 Pure hypercholesterolemia, unspecified: Secondary | ICD-10-CM | POA: Insufficient documentation

## 2014-02-19 DIAGNOSIS — IMO0002 Reserved for concepts with insufficient information to code with codable children: Secondary | ICD-10-CM | POA: Insufficient documentation

## 2014-02-19 DIAGNOSIS — Z8546 Personal history of malignant neoplasm of prostate: Secondary | ICD-10-CM | POA: Insufficient documentation

## 2014-02-19 DIAGNOSIS — G459 Transient cerebral ischemic attack, unspecified: Secondary | ICD-10-CM | POA: Insufficient documentation

## 2014-02-19 DIAGNOSIS — Z7982 Long term (current) use of aspirin: Secondary | ICD-10-CM | POA: Insufficient documentation

## 2014-02-19 DIAGNOSIS — E119 Type 2 diabetes mellitus without complications: Secondary | ICD-10-CM | POA: Insufficient documentation

## 2014-02-19 DIAGNOSIS — G40909 Epilepsy, unspecified, not intractable, without status epilepticus: Secondary | ICD-10-CM | POA: Insufficient documentation

## 2014-02-19 LAB — CBC WITH DIFFERENTIAL/PLATELET
Basophils Absolute: 0 10*3/uL (ref 0.0–0.1)
Basophils Relative: 0 % (ref 0–1)
Eosinophils Absolute: 0 10*3/uL (ref 0.0–0.7)
Eosinophils Relative: 0 % (ref 0–5)
HEMATOCRIT: 39.9 % (ref 39.0–52.0)
Hemoglobin: 13.2 g/dL (ref 13.0–17.0)
LYMPHS PCT: 27 % (ref 12–46)
Lymphs Abs: 1.4 10*3/uL (ref 0.7–4.0)
MCH: 28.5 pg (ref 26.0–34.0)
MCHC: 33.1 g/dL (ref 30.0–36.0)
MCV: 86.2 fL (ref 78.0–100.0)
MONO ABS: 0.5 10*3/uL (ref 0.1–1.0)
Monocytes Relative: 10 % (ref 3–12)
Neutro Abs: 3.3 10*3/uL (ref 1.7–7.7)
Neutrophils Relative %: 62 % (ref 43–77)
Platelets: 180 10*3/uL (ref 150–400)
RBC: 4.63 MIL/uL (ref 4.22–5.81)
RDW: 13.7 % (ref 11.5–15.5)
WBC: 5.3 10*3/uL (ref 4.0–10.5)

## 2014-02-19 LAB — TROPONIN I: Troponin I: <0.3 ng/mL (ref <0.30)

## 2014-02-19 LAB — COMPREHENSIVE METABOLIC PANEL
ALT: 43 U/L (ref 0–53)
AST: 27 U/L (ref 0–37)
Albumin: 3.9 g/dL (ref 3.5–5.2)
Alkaline Phosphatase: 124 U/L — ABNORMAL HIGH (ref 39–117)
BUN: 26 mg/dL — ABNORMAL HIGH (ref 6–23)
CO2: 22 meq/L (ref 19–32)
Calcium: 8.9 mg/dL (ref 8.4–10.5)
Chloride: 101 mEq/L (ref 96–112)
Creatinine, Ser: 1.25 mg/dL (ref 0.50–1.35)
GFR calc non Af Amer: 58 mL/min — ABNORMAL LOW (ref >90)
GFR, EST AFRICAN AMERICAN: 68 mL/min — AB (ref >90)
GLUCOSE: 110 mg/dL — AB (ref 70–99)
Potassium: 4.7 mEq/L (ref 3.7–5.3)
Sodium: 139 mEq/L (ref 137–147)
Total Bilirubin: <0.2 mg/dL — ABNORMAL LOW (ref 0.3–1.2)
Total Protein: 6.9 g/dL (ref 6.0–8.3)

## 2014-02-19 LAB — PROTIME-INR
INR: 0.93 (ref 0.00–1.49)
PROTHROMBIN TIME: 12.3 s (ref 11.6–15.2)

## 2014-02-19 LAB — PHENYTOIN LEVEL, TOTAL: PHENYTOIN LVL: 18 ug/mL (ref 10.0–20.0)

## 2014-02-19 MED ORDER — CLOPIDOGREL BISULFATE 75 MG PO TABS: 75.0000 mg | ORAL_TABLET | Freq: Every day | ORAL | Status: DC

## 2014-02-19 NOTE — ED Notes (Signed)
The pt has had a numbness in his rt arm and hand  For 2 weeks intermttently.  He has seen his doctor for the same and had a c-t. 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  The pt  Woke up this am  With these symptoms

## 2014-02-19 NOTE — ED Provider Notes (Signed)
CSN: [id number]     Arrival date & time 02/19/14  0607 History   First MD Initiated Contact with Patient 02/19/14 [phone]     Chief Complaint  Patient presents with  . numbness rt arm and hand      (Consider location/radiation/quality/duration/timing/severity/associated sxs/prior Treatment) HPI Comments: Patient is a 68 year old male with history of hypertension, hypercholesterolemia, seizures. He presents today with complaints of intermittent numbness in his right arm and hand for the past 2 weeks. He denies any injury or trauma. He denies any headache or neck pain. He also states that his blood pressures have been running higher than normal. He was seen by his primary Dr. earlier this week. CT scan of the head was performed and was unremarkable and he was started on clonidine. He had another episode sometime in the night and woke with recurrent numbness in the right arm.   Patient is a 68 y.o. male presenting with weakness. The history is provided by the patient.  Weakness This is a new problem. The current episode started more than 1 week ago. Pertinent negatives include no headaches. Nothing aggravates the symptoms. Nothing relieves the symptoms. He has tried nothing for the symptoms.    Past Medical History  Diagnosis Date  . Hypertension   . Hyperlipidemia   . Cancer     prostate cancer treated 2007 with radiation seed placement  . Allergic rhinitis   . Sleep apnea   . H/O prostate cancer     Radioactive seeds  . Diabetes   . Balantidiasis   . Hyperlipoproteinemia   . Hypercholesteremia    Past Surgical History  Procedure Laterality Date  . Cyst removed from right kidney     Family History  Problem Relation Age of Onset  . CVA Mother   . Sleep apnea Brother    History  Substance Use Topics  . Smoking status: Never Smoker   . Smokeless tobacco: Never Used  . Alcohol Use: No    Review of Systems  Neurological: Positive for weakness. Negative for headaches.  All  other systems reviewed and are negative.      Allergies  Review of patient's allergies indicates no known allergies.  Home Medications   Current Outpatient Rx  Name  Route  Sig  Dispense  Refill  . allopurinol (ZYLOPRIM) 100 MG tablet   Oral   Take 100 mg by mouth daily.          [name] aspirin EC 81 MG tablet   Oral   Take 81 mg by mouth daily.         . cloNIDine (CATAPRES) 0.1 MG tablet   Oral   Take 0.1 mg by mouth daily.         . colchicine 0.6 MG tablet   Oral   Take 0.6 mg by mouth 2 (two) times daily.         [name] ezetimibe (ZETIA) 10 MG tablet   Oral   Take 10 mg by mouth daily.         . fluticasone (FLONASE) 50 MCG/ACT nasal spray   Each Nare   Place 1 spray into both nostrils daily.          [name] glimepiride (AMARYL) 1 MG tablet   Oral   Take 1-2 mg by mouth 2 (two) times daily. Take 1 tablet every morning and take 2 tablets every evening         . JANUMET 50-500 MG per tablet   Oral  Take 1 tablet by mouth 2 (two) times daily.         [name] lisinopril-hydrochlorothiazide (PRINZIDE,ZESTORETIC) 20-12.5 MG per tablet   Oral   Take 1 tablet by mouth daily.   30 tablet   6   . phenytoin (DILANTIN) 100 MG ER capsule   Oral   Take 400 mg by mouth at bedtime.         . TRAVATAN Z 0.004 % SOLN ophthalmic solution   Both Eyes   Place 1 drop into both eyes at bedtime.           BP 123/68  Pulse 91  Resp 15  SpO2 97% Physical Exam  Nursing note and vitals reviewed. Constitutional: He is oriented to person, place, and time. He appears well-developed and well-nourished. No distress.  HENT:  Head: Normocephalic and atraumatic.  Mouth/Throat: Oropharynx is clear and moist.  Eyes: EOM are normal. Pupils are equal, round, and reactive to light.  Neck: Normal range of motion. Neck supple.  Cardiovascular: Normal rate, regular rhythm and normal heart sounds.   No murmur heard. Pulmonary/Chest: Effort normal and breath sounds normal. No  respiratory distress. He has no wheezes.  Abdominal: Soft. Bowel sounds are normal. He exhibits no distension. There is no tenderness.  Musculoskeletal: Normal range of motion. He exhibits no edema.  Lymphadenopathy:    He has no cervical adenopathy.  Neurological: He is alert and oriented to person, place, and time. No cranial nerve deficit. He exhibits normal muscle tone. Coordination normal.  Skin: Skin is warm and dry. He is not diaphoretic.    ED Course  Procedures (including critical care time) Labs Review Labs Reviewed  CBC WITH DIFFERENTIAL  COMPREHENSIVE METABOLIC PANEL  PHENYTOIN LEVEL, TOTAL  TROPONIN I  PROTIME-INR   Imaging Review No results found.   EKG Interpretation   Date/Time:  Saturday February 19 2014 06:14:36 EDT Ventricular Rate:  94 PR Interval:  172 QRS Duration: 84 QT Interval:  332 QTC Calculation: 415 R Axis:   83 Text Interpretation:  Normal sinus rhythm Right atrial enlargement Septal  infarct , age undetermined ST \\T \ T wave abnormality, consider lateral  ischemia Abnormal ECG Unchanged when compared with prior ecg Confirmed by  DELOS  MD, Mayzee Reichenbach ([id number]) on 02/19/2014 7:29:42 AM      MDM   Final diagnoses:  None    Patient is a 68 year old male with history of hypertension and diabetes. He presents today with complaints of intermittent right arm numbness that has been coming and going for the past 1-2 weeks. He had a CT scan last week which was unremarkable. Physical examination today reveals a nonfocal neurologic exam and laboratory studies are unremarkable. MRI of the brain does reveal chronic changes but no acute findings of a stroke. I've discussed the case with Dr. Doy Mince from neurology who recommends the addition of Plavix. This will be prescribed and the patient will be advised to followup with his primary Dr. this week upcoming. He understands to return if his symptoms substantially worsen or change. He remains neurologically intact  throughout his emergency department stay and he is currently symptom-free at the time of discharge.    Veryl Speak, MD 02/19/14 1050

## 2014-02-19 NOTE — Discharge Instructions (Signed)
Begin taking Plavix as prescribed in addition to your other medications.  Followup with your primary Dr. this upcoming week for a recheck.  Return to the emergency department if your symptoms substantially worsen or change.   Transient Ischemic Attack A transient ischemic attack (TIA) is a "warning stroke" that causes stroke-like symptoms. Unlike a stroke, a TIA does not cause permanent damage to the brain. The symptoms of a TIA can happen very fast and do not last long. It is important to know the symptoms of a TIA and what to do. This can help prevent a major stroke or death. CAUSES   A TIA is caused by a temporary blockage in an artery in the brain or neck (carotid artery). The blockage does not allow the brain to get the blood supply it needs and can cause different symptoms. The blockage can be caused by either:  A blood clot.  Fatty buildup (plaque) in a neck or brain artery. RISK FACTORS  High blood pressure (hypertension).  High cholesterol.  Diabetes mellitus.  Heart disease.  The build up of plaque in the blood vessels (peripheral artery disease or atherosclerosis).  The build up of plaque in the blood vessels providing blood and oxygen to the brain (carotid artery stenosis).  An abnormal heart rhythm (atrial fibrillation).  Obesity.  Smoking.  Taking oral contraceptives (especially in combination with smoking).  Physical inactivity.  A diet high in fats, salt (sodium), and calories.  Alcohol use.  Use of illegal drugs (especially cocaine and methamphetamine).  Being male.  Being African American.  Being over the age of 82.  Family history of stroke.  Previous history of blood clots, stroke, TIA, or heart attack.  Sickle cell disease. SYMPTOMS  TIA symptoms are the same as a stroke but are temporary. These symptoms usually develop suddenly, or may be newly present upon awakening from sleep:  Sudden weakness or numbness of the face, arm, or leg,  especially on one side of the body.  Sudden trouble walking or difficulty moving arms or legs.  Sudden confusion.  Sudden personality changes.  Trouble speaking (aphasia) or understanding.  Difficulty swallowing.  Sudden trouble seeing in one or both eyes.  Double vision.  Dizziness.  Loss of balance or coordination.  Sudden severe headache with no known cause.  Trouble reading or writing.  Loss of bowel or bladder control.  Loss of consciousness. DIAGNOSIS  Your caregiver may be able to determine the presence or absence of a TIA based on your symptoms, history, and physical exam. Computed tomography (CT scan) of the brain is usually performed to help identify a TIA. Other tests may be done to diagnose a TIA. These tests may include:  Electrocardiography.  Continuous heart monitoring.  Echocardiography.  Carotid ultrasonography.  Magnetic resonance imaging (MRI).  A scan of the brain circulation.  Blood tests. PREVENTION  The risk of a TIA can be decreased by appropriately treating high blood pressure, high cholesterol, diabetes, heart disease, and obesity and by quitting smoking, limiting alcohol, and staying physically active. TREATMENT  Time is of the essence. Since the symptoms of TIA are the same as a stroke, it is important to seek treatment within 3 4 hours of the start of symptoms because you may receive a medicine to dissolve the clot (thrombolytic) that cannot be given after that time. Treatment options vary. Treatment options may include rest, oxygen, intravenous (IV) fluids, and medicines to thin the blood (anticoagulants). Medicines and diet may be used to address  diabetes, high blood pressure, and other risk factors. Measures will be taken to prevent short-term and long-term complications, including infection from breathing foreign material into the lungs (aspiration pneumonia), blood clots in the legs, and falls. Treatment options include procedures to  either remove plaque in the carotid arteries or dilate carotid arteries that have narrowed due to plaque. Those procedures are:  Carotid endarterectomy.  Carotid angioplasty and stenting. HOME CARE INSTRUCTIONS   Take all medicines prescribed by your caregiver. Follow the directions carefully. Medicines may be used to control risk factors for a stroke. Be sure you understand all your medicine instructions.  You may be told to take aspirin or the anticoagulant warfarin. Warfarin needs to be taken exactly as instructed.  Taking too much or too little warfarin is dangerous. Too much warfarin increases the risk of bleeding. Too little warfarin continues to allow the risk for blood clots. While taking warfarin, you will need to have regular blood tests to measure your blood clotting time. A PT blood test measures how long it takes for blood to clot. Your PT is used to calculate another value called an INR. Your PT and INR help your caregiver to adjust your dose of warfarin. The dose can change for many reasons. It is critically important that you take warfarin exactly as prescribed.  Many foods, especially foods high in vitamin K can interfere with warfarin and affect the PT and INR. Foods high in vitamin K include spinach, kale, broccoli, cabbage, collard and turnip greens, brussels sprouts, peas, cauliflower, seaweed, and parsley as well as beef and pork liver, green tea, and soybean oil. You should eat a consistent amount of foods high in vitamin K. Avoid major changes in your diet, or notify your caregiver before changing your diet. Arrange a visit with a dietitian to answer your questions.  Many medicines can interfere with warfarin and affect the PT and INR. You must tell your caregiver about any and all medicines you take, this includes all vitamins and supplements. Be especially cautious with aspirin and anti-inflammatory medicines. Do not take or discontinue any prescribed or over-the-counter  medicine except on the advice of your caregiver or pharmacist.  Warfarin can have side effects, such as excessive bruising or bleeding. You will need to hold pressure over cuts for longer than usual. Your caregiver or pharmacist will discuss other potential side effects.  Avoid sports or activities that may cause injury or bleeding.  Be mindful when shaving, flossing your teeth, or handling sharp objects.  Alcohol can change the body's ability to handle warfarin. It is best to avoid alcoholic drinks or consume only very small amounts while taking warfarin. Notify your caregiver if you change your alcohol intake.  Notify your dentist or other caregivers before procedures.  Eat a diet that includes 5 or more servings of fruits and vegetables each day. This may reduce the risk of stroke. Certain diets may be prescribed to address high blood pressure, high cholesterol, diabetes, or obesity.  A low-sodium, low-saturated fat, low-trans fat, low-cholesterol diet is recommended to manage high blood pressure.  A low-saturated fat, low-trans fat, low-cholesterol, and high-fiber diet may control cholesterol levels.  A controlled-carbohydrate, controlled-sugar diet is recommended to manage diabetes.  A reduced-calorie, low-sodium, low-saturated fat, low-trans fat, low-cholesterol diet is recommended to manage obesity.  Maintain a healthy weight.  Stay physically active. It is recommended that you get at least 30 minutes of activity on most or all days.  Do not smoke.  Limit alcohol  use even if you are not taking warfarin. Moderate alcohol use is considered to be:  No more than 2 drinks each day for men.  No more than 1 drink each day for nonpregnant women.  Stop drug abuse.  Home safety. A safe home environment is important to reduce the risk of falls. Your caregiver may arrange for specialists to evaluate your home. Having grab bars in the bedroom and bathroom is often important. Your  caregiver may arrange for equipment to be used at home, such as raised toilets and a seat for the shower.  Follow all instructions for follow-up with your caregiver. This is very important. This includes any referrals and lab tests. Proper follow up can prevent a stroke or another TIA from occurring. SEEK MEDICAL CARE IF:  You have personality changes.  You have difficulty swallowing.  You are seeing double.  You have dizziness.  You have a fever.  You have skin breakdown. SEEK IMMEDIATE MEDICAL CARE IF:  Any of these symptoms may represent a serious problem that is an emergency. Do not wait to see if the symptoms will go away. Get medical help right away. Call your local emergency services (911 in [place] Do not drive yourself to the hospital.  You have sudden weakness or numbness of the face, arm, or leg, especially on one side of the body.  You have sudden trouble walking or difficulty moving arms or legs.  You have sudden confusion.  You have trouble speaking (aphasia) or understanding.  You have sudden trouble seeing in one or both eyes.  You have a loss of balance or coordination.  You have a sudden, severe headache with no known cause.  You have new chest pain or an irregular heartbeat.  You have a partial or total loss of consciousness. MAKE SURE YOU:   Understand these instructions.  Will watch your condition.  Will get help right away if you are not doing well or get worse. Document Released: 09/04/2005 Document Revised: 11/11/2012 Document Reviewed: 01/18/2010 [facility] Patient Information 2014 [place].

## 2014-02-21 ENCOUNTER — Other Ambulatory Visit (HOSPITAL_COMMUNITY): Payer: Self-pay | Admitting: Internal Medicine

## 2014-02-21 DIAGNOSIS — I639 Cerebral infarction, unspecified: Secondary | ICD-10-CM

## 2014-02-23 ENCOUNTER — Ambulatory Visit (HOSPITAL_COMMUNITY)
Admission: RE | Admit: 2014-02-23 | Discharge: 2014-02-23 | Disposition: A | Discharge status: Home / Self Care | Payer: 59 | Source: Ambulatory Visit | Attending: Internal Medicine | Admitting: Internal Medicine

## 2014-02-23 DIAGNOSIS — R209 Unspecified disturbances of skin sensation: Secondary | ICD-10-CM | POA: Insufficient documentation

## 2014-02-23 DIAGNOSIS — I639 Cerebral infarction, unspecified: Secondary | ICD-10-CM

## 2014-02-23 NOTE — Progress Notes (Signed)
*  PRELIMINARY RESULTS* Vascular Ultrasound Carotid Duplex (Doppler) has been completed.  Preliminary findings: Bilateral:  1-39% ICA stenosis.  Vertebral artery flow is antegrade.      Landry Mellow, RDMS, RVT  02/23/2014, 1:11 PM

## 2014-02-25 ENCOUNTER — Encounter: Payer: Self-pay | Admitting: Diagnostic Neuroimaging

## 2014-02-25 ENCOUNTER — Ambulatory Visit (INDEPENDENT_AMBULATORY_CARE_PROVIDER_SITE_OTHER): Payer: 59 | Admitting: Diagnostic Neuroimaging

## 2014-02-25 VITALS — BP 121/68 | HR 81 | Temp 98.6°F | Ht 63.0 in | Wt 199.0 lb

## 2014-02-25 DIAGNOSIS — R209 Unspecified disturbances of skin sensation: Secondary | ICD-10-CM

## 2014-02-25 DIAGNOSIS — G459 Transient cerebral ischemic attack, unspecified: Secondary | ICD-10-CM

## 2014-02-25 DIAGNOSIS — R200 Anesthesia of skin: Secondary | ICD-10-CM

## 2014-02-25 DIAGNOSIS — G40209 Localization-related (focal) (partial) symptomatic epilepsy and epileptic syndromes with complex partial seizures, not intractable, without status epilepticus: Secondary | ICD-10-CM

## 2014-02-25 DIAGNOSIS — R202 Paresthesia of skin: Secondary | ICD-10-CM

## 2014-02-25 NOTE — Patient Instructions (Signed)
Continue aspirin and plavix for 3 months, then stop aspirin.  Continue medications for hypertension, diabetes and cholesterol.  I will check nerve testing and CT angio (head/neck).

## 2014-02-25 NOTE — Progress Notes (Signed)
[facility]  PATIENT: Jason Salazar DOB: September 29, 1946  REFERRING CLINICIAN: Avbere HISTORY FROM: patient and daughter REASON FOR VISIT: new consult   HISTORICAL  CHIEF COMPLAINT:  Chief Complaint  Patient presents with  . Cerebrovascular Accident    HISTORY OF PRESENT ILLNESS:   68 year old right-handed male with hypertension, diabetes, hyperkalemia, prostate cancer, here for evaluation of TIA.  02/05/2014, patient had a 10-20 minute episode of left arm numbness and tingling. Patient's symptoms were quite severe and they called EMS. Upon arrival his blood pressure was 200/150. Symptoms resolved and he did not go to the hospital. Over the next few days patient noted variable blood pressures sometimes higher in the right arm, sometimes higher in the left arm. He also had a alternating numbness sensation in either the right or left arm. He had a particularly severe at event on 02/19/14, and therefore went to the emergency room. Patient had MRI of the brain which showed no acute findings. Patient had been taking aspirin. Plavix was added on in the emergency room and patient was discharged for further evaluation.  Since that time patient has continued to have intermittent episodes of numbness in either the right or left arm, lasting for a few minutes at a time.  Separately patient has a long history of seizure disorder from age 65 years old. Probably patient fell from a tree and ever since that time he had grand mal seizures. Typical seizures involve drawing sensation in the left hand, followed by convulsions of the left arm and then spreading generalized grand mal seizures. Loss of consciousness and tongue biting with incontinence have occurred. His last seizure was in 2011 which was a partial seizure involving the left hand. He has had about 2 or 3 seizures in the last 19 years. Patient was started on Dilantin age 73 years old. He's done quite well on this over many  years. He is seen by our nurse practitioner Cecille Rubin in our practice.  REVIEW OF SYSTEMS: Full 14 system review of systems performed and notable only for numbness snoring on CPAP, seizure joint pain incontinence ringing in ears murmur 18 pound intentional weight loss over the past one month.  ALLERGIES: No Known Allergies  HOME MEDICATIONS: Outpatient Prescriptions Prior to Visit  Medication Sig Dispense Refill  . allopurinol (ZYLOPRIM) 100 MG tablet Take 100 mg by mouth daily.       [name] aspirin EC 81 MG tablet Take 81 mg by mouth daily.      . cloNIDine (CATAPRES) 0.1 MG tablet Take 0.1 mg by mouth daily.      . clopidogrel (PLAVIX) 75 MG tablet Take 1 tablet (75 mg total) by mouth daily with breakfast.  30 tablet  0  . colchicine 0.6 MG tablet Take 0.6 mg by mouth 2 (two) times daily.      [name] ezetimibe (ZETIA) 10 MG tablet Take 10 mg by mouth daily.      . fluticasone (FLONASE) 50 MCG/ACT nasal spray Place 1 spray into both nostrils daily.       [name] glimepiride (AMARYL) 1 MG tablet Take 1-2 mg by mouth 2 (two) times daily. Take 1 tablet every morning and take 2 tablets every evening      . JANUMET 50-500 MG per tablet Take 1 tablet by mouth 2 (two) times daily.      [name] lisinopril-hydrochlorothiazide (PRINZIDE,ZESTORETIC) 20-12.5 MG per tablet Take 1 tablet by mouth daily.  30 tablet  6  . phenytoin (DILANTIN) 100 MG  ER capsule Take 400 mg by mouth at bedtime.      . TRAVATAN Z 0.004 % SOLN ophthalmic solution Place 1 drop into both eyes at bedtime.        No facility-administered medications prior to visit.    PAST MEDICAL HISTORY: Past Medical History  Diagnosis Date  . Hypertension   . Hyperlipidemia   . Cancer     prostate cancer treated 2007 with radiation seed placement  . Allergic rhinitis   . Sleep apnea   . H/O prostate cancer     Radioactive seeds  . Diabetes   . Balantidiasis   . Hyperlipoproteinemia   . Hypercholesteremia     PAST SURGICAL HISTORY: Past  Surgical History  Procedure Laterality Date  . Cyst removed from right kidney      FAMILY HISTORY: Family History  Problem Relation Age of Onset  . CVA Mother   . Sleep apnea Brother   . Thyroid cancer Father   . Prostate cancer Father     SOCIAL HISTORY:  History   Social History  . Marital Status: Married    Spouse Name: [name]    Number of Children: 1  . Years of Education: HS   Occupational History  .  [employer]   Social History Main Topics  . Smoking status: Never Smoker   . Smokeless tobacco: Never Used  . Alcohol Use: No  . Drug Use: No  . Sexual Activity: Not on file   Other Topics Concern  . Not on file   Social History Narrative   Patient is married to [place] and has one son.   Caffeine Use: none; quit a few months ago               PHYSICAL EXAM  Filed Vitals:   02/25/14 0927  BP: 121/68  Pulse: 81  Temp: 98.6 F (37 C)  TempSrc: Oral  Height: 5\' 3"  (1.6 m)  Weight: 199 lb (90.266 kg)    Not recorded    Body mass index is 35.26 kg/(m^2).  GENERAL EXAM: Patient is in no distress; well developed, nourished and groomed; neck is supple  CARDIOVASCULAR: Regular rate and rhythm, no murmurs, no carotid bruits; LEFT RADIAL PULSE SLIGHTLY DECREASED COMPARED TO RIGHT SIDE. NO SUPRACLAVICULAR BRUITS.  -BP IN RIGHT ARM (111/68) -BP IN LEFT ARM (104/65)  NEUROLOGIC: MENTAL STATUS: awake, alert, oriented to person, place and time, recent and remote memory intact, normal attention and concentration, language fluent, comprehension intact, naming intact, fund of knowledge appropriate CRANIAL NERVE: no papilledema on fundoscopic exam, pupils equal and reactive to light, visual fields full to confrontation, extraocular muscles intact, no nystagmus, facial sensation and strength symmetric, hearing intact, palate elevates symmetrically, uvula midline, shoulder shrug symmetric, tongue midline. MOTOR: normal bulk and tone, full strength in  the BUE, BLE SENSORY: normal and symmetric to light touch, pinprick, temperature, vibration; VIB 3 SEC AT TOES. COORDINATION: finger-nose-finger, fine finger movements normal REFLEXES: BUE 1, KNEES TRACE, ANKLES 0 GAIT/STATION: narrow based gait; able to walk on toes, heels and tandem; romberg is negative    DIAGNOSTIC DATA (LABS, IMAGING, TESTING) - I reviewed patient records, labs, notes, testing and imaging myself where available.  Lab Results  Component Value Date   WBC 5.3 02/19/2014   HGB 13.2 02/19/2014   HCT 39.9 02/19/2014   MCV 86.2 02/19/2014   PLT 180 02/19/2014      Component Value Date/Time   NA 139 02/19/2014 0843   K  4.7 02/19/2014 0843   CL 101 02/19/2014 0843   CO2 22 02/19/2014 0843   GLUCOSE 110* 02/19/2014 0843   BUN 26* 02/19/2014 0843   CREATININE 1.25 02/19/2014 0843   CALCIUM 8.9 02/19/2014 0843   PROT 6.9 02/19/2014 0843   ALBUMIN 3.9 02/19/2014 0843   AST 27 02/19/2014 0843   ALT 43 02/19/2014 0843   ALKPHOS 124* 02/19/2014 0843   BILITOT <0.2* 02/19/2014 0843   GFRNONAA 58* 02/19/2014 0843   GFRAA 68* 02/19/2014 0843   No results found for this basename: CHOL, HDL, LDLCALC, LDLDIRECT, TRIG, CHOLHDL   No results found for this basename: HGBA1C   No results found for this basename: VITAMINB12   No results found for this basename: TSH    I reviewed images myself and agree with interpretation. -VRP  02/19/14 MRI BRAIN 1. 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  2. Asymmetric right parietal and occipital white matter change. This  is likely related to the remote ischemic events. Posterior  reversible encephalopathy syndrome is also considered.  3. Focal atrophy is evident in the left parietal lobe as well.  4. Asymmetric left-sided periventricular white matter changes and  remote lacunar infarcts of the basal ganglia.  5. The overall picture is that of significant microvascular disease,  advanced for age.  02/23/14  CAROTID U/S - The vertebral arteries appear patent with antegrade flow. - Findings consistent with 1-39 percent stenosis involving the right internal carotid artery and the left internal carotid artery. - ICA/CCA ratio. right = 0.71. left = 0.85.  10/19/13 TTE  - EF 55-60%; Wall motion was normal; there were no regional wall motion abnormalities. There was an increased relative contribution of atrial contraction to ventricular filling. Doppler parameters are consistent with abnormal left ventricular relaxation (grade 1 diastolic dysfunction).   ASSESSMENT AND PLAN  68 y.o. year old male here with intermittent numbness and tingling of the right or left arm, since 02/05/2014. Differential diagnosis includes TIA, cervical radiculopathy, neuropathy or partial seizure. TIA workup has been complete. Report of variable blood pressure the right or left arm raises possibility of subclavian stenosis or subclavian steal. However this is not apparent on blood pressure measurements today nor carotid ultrasound testing. Nevertheless I will check CT anterior of the head and neck for further vascular evaluation. Also will order a heart/cardiac monitoring to look for paroxysmal atrial fibrillation. Agree with aspirin and Plavix for the next 3 months, then reduce to Plavix alone.  Ddx: TIA vs cervical radiculopathy vs neuropathy vs partial seizure  PLAN: - Continue aspirin and plavix for 3 months, then stop aspirin. - Continue medications for hypertension, diabetes and cholesterol.  Orders Placed This Encounter  Procedures  . CT Angio Head W/Cm &/Or Wo Cm  . CT Angio Neck W/Cm &/Or Wo/Cm  . NCV with EMG(electromyography)   Return for emg/ncs.    Penni Bombard, MD 5/46/5681, 27:51 AM Certified in Neurology, Neurophysiology and Neuroimaging  [facility] [address] [place], [place] [address] [phone]

## 2014-03-01 ENCOUNTER — Ambulatory Visit (INDEPENDENT_AMBULATORY_CARE_PROVIDER_SITE_OTHER): Payer: 59 | Admitting: Interventional Cardiology

## 2014-03-01 ENCOUNTER — Encounter: Payer: Self-pay | Admitting: Interventional Cardiology

## 2014-03-01 VITALS — BP 110/60 | HR 90 | Ht 63.0 in | Wt 198.0 lb

## 2014-03-01 DIAGNOSIS — G4733 Obstructive sleep apnea (adult) (pediatric): Secondary | ICD-10-CM

## 2014-03-01 DIAGNOSIS — I10 Essential (primary) hypertension: Secondary | ICD-10-CM

## 2014-03-01 DIAGNOSIS — E7800 Pure hypercholesterolemia, unspecified: Secondary | ICD-10-CM

## 2014-03-01 DIAGNOSIS — G459 Transient cerebral ischemic attack, unspecified: Secondary | ICD-10-CM

## 2014-03-01 NOTE — Patient Instructions (Signed)
Your physician recommends that you continue on your current medications as directed. Please refer to the Current Medication list given to you today.  Your physician has recommended that you wear an event monitor. Event monitors are medical devices that record the heart's electrical activity. Doctors most often us these monitors to diagnose arrhythmias. Arrhythmias are problems with the speed or rhythm of the heartbeat. The monitor is a small, portable device. You can wear one while you do your normal daily activities. This is usually used to diagnose what is causing palpitations/syncope (passing out).  Your physician wants you to follow-up in: 6 months You will receive a reminder letter in the mail two months in advance. If you don't receive a letter, please call our office to schedule the follow-up appointment.  

## 2014-03-01 NOTE — Progress Notes (Signed)
Patient ID: Jason Salazar, male   DOB: 09/01/1946, 68 y.o.   MRN: [id number]    [address], [place], [place]  [address] Phone: [phone] Fax:  [phone]  Date:  03/01/2014   ID:  Jason Salazar, DOB 10-17-1946, MRN [id number]  PCP:  Philis Fendt, MD   ASSESSMENT:  1. Recurring episodes of bilateral arm numbness. This is usually present when he awakens from sleep. The episodes of numbness occurs one arm at that time. Neurology is concerned about the possibility of TIA 2. Hypertension 3. Exertional dyspnea 4. History of prostate cancer  PLAN:  1. Thirty-day continuous monitor 2. Blood pressure is under excellent control and therefore needs no change   SUBJECTIVE: Jason Salazar is a 68 y.o. male who has had recurring left and right arm numbness occurring intermittently. These episodes are noticeable early in the mornings when he awakens from sleep. There is no associated weakness. Sometimes it is a left arm other times is a right arm. He has not had vision disturbance, difficulty with speech, vertigo, double vision, any paresthesias, or other complaints. The numbness usually resolves after 20 or 30 minutes.   Wt Readings from Last 3 Encounters:  03/01/14 198 lb (89.812 kg)  02/25/14 199 lb (90.266 kg)  01/06/14 206 lb (93.441 kg)     Past Medical History  Diagnosis Date  . Hypertension   . Hyperlipidemia   . Cancer     prostate cancer treated 2007 with radiation seed placement  . Allergic rhinitis   . Sleep apnea   . H/O prostate cancer     Radioactive seeds  . Diabetes   . Balantidiasis   . Hyperlipoproteinemia   . Hypercholesteremia     Current Outpatient Prescriptions  Medication Sig Dispense Refill  . allopurinol (ZYLOPRIM) 100 MG tablet Take 100 mg by mouth daily.       [name] aspirin EC 81 MG tablet Take 81 mg by mouth daily.      . cloNIDine (CATAPRES) 0.1 MG tablet Take 0.1 mg by mouth daily.      . clopidogrel (PLAVIX) 75 MG tablet  Take 1 tablet (75 mg total) by mouth daily with breakfast.  30 tablet  0  . colchicine 0.6 MG tablet Take 0.6 mg by mouth 2 (two) times daily.      [name] ezetimibe (ZETIA) 10 MG tablet Take 10 mg by mouth daily.      . fluticasone (FLONASE) 50 MCG/ACT nasal spray Place 1 spray into both nostrils daily.       [name] glimepiride (AMARYL) 1 MG tablet Take 1-2 mg by mouth 2 (two) times daily. Take 1 tablet every morning and take 2 tablets every evening      . JANUMET 50-500 MG per tablet Take 1 tablet by mouth 2 (two) times daily.      [name] lisinopril-hydrochlorothiazide (PRINZIDE,ZESTORETIC) 20-12.5 MG per tablet Take 1 tablet by mouth daily.  30 tablet  6  . phenytoin (DILANTIN) 100 MG ER capsule Take 400 mg by mouth at bedtime.      . TRAVATAN Z 0.004 % SOLN ophthalmic solution Place 1 drop into both eyes at bedtime.        No current facility-administered medications for this visit.    Allergies:   No Known Allergies  Social History:  The patient  reports that he has never smoked. He has never used smokeless tobacco. He reports that he does not drink alcohol or use illicit drugs.  ROS:  Please see the history of present illness.   He denies syncope   All other systems reviewed and negative.   OBJECTIVE: VS:  BP 110/60  Pulse 90  Ht 5\' 3"  (1.6 m)  Wt 198 lb (89.812 kg)  BMI 35.08 kg/m2 Well nourished, well developed, in no acute distress, obese HEENT: normal Neck: JVD flat. Carotid bruit absent  Cardiac:  normal S1, S2; RRR; no murmur Lungs:  clear to auscultation bilaterally, no wheezing, rhonchi or rales Abd: soft, nontender, no hepatomegaly Ext: Edema absent. Pulses bilateral 2+ Skin: warm and dry Neuro:  CNs 2-12 intact, no focal abnormalities noted  EKG:  Not performed       Signed, Illene Labrador III, MD 03/01/2014 3:58 PM

## 2014-03-03 ENCOUNTER — Encounter (INDEPENDENT_AMBULATORY_CARE_PROVIDER_SITE_OTHER): Payer: 59

## 2014-03-03 DIAGNOSIS — G459 Transient cerebral ischemic attack, unspecified: Secondary | ICD-10-CM

## 2014-03-07 ENCOUNTER — Ambulatory Visit (INDEPENDENT_AMBULATORY_CARE_PROVIDER_SITE_OTHER): Payer: 59 | Admitting: Diagnostic Neuroimaging

## 2014-03-07 ENCOUNTER — Encounter (INDEPENDENT_AMBULATORY_CARE_PROVIDER_SITE_OTHER): Payer: Self-pay

## 2014-03-07 DIAGNOSIS — R202 Paresthesia of skin: Secondary | ICD-10-CM

## 2014-03-07 DIAGNOSIS — G40209 Localization-related (focal) (partial) symptomatic epilepsy and epileptic syndromes with complex partial seizures, not intractable, without status epilepticus: Secondary | ICD-10-CM

## 2014-03-07 DIAGNOSIS — G459 Transient cerebral ischemic attack, unspecified: Secondary | ICD-10-CM

## 2014-03-07 DIAGNOSIS — G5600 Carpal tunnel syndrome, unspecified upper limb: Secondary | ICD-10-CM

## 2014-03-07 DIAGNOSIS — R200 Anesthesia of skin: Secondary | ICD-10-CM

## 2014-03-07 DIAGNOSIS — Z0289 Encounter for other administrative examinations: Secondary | ICD-10-CM

## 2014-03-07 NOTE — Procedures (Signed)
   [facility]  NCS (NERVE CONDUCTION STUDY) WITH EMG (ELECTROMYOGRAPHY) REPORT   STUDY DATE: 03/07/14 PATIENT NAME: Jason Salazar DOB: 1946/06/06 MRN: [id number]  ORDERING CLINICIAN: Andrey Spearman, MD   TECHNOLOGIST: Laretta Alstrom ELECTROMYOGRAPHER: Earlean Polka. Cattleya Dobratz, MD  CLINICAL INFORMATION: 68 year old male with bilateral upper extremity numbness.  FINDINGS: NERVE CONDUCTION STUDY: Bilateral median motor responses have prolonged distal latencies (right 7.8 ms, left 8.5 ms, normal greater than or equal to 4.2 ms), decreased amplitudes, normal conduction velocities and prolonged F-wave latencies. Bilateral ulnar motor responses and F-wave latencies are normal. Bilateral median sensory responses have decreased amplitudes and slow conduction velocities (right 23 m/s, left 22 m/s, normal greater than or equal to 48 m/s). Bilateral ulnar sensory responses are normal.  NEEDLE ELECTROMYOGRAPHY: Needle examination of left upper extremity demonstrates: No abnormal spontaneous activity at rest in any tested muscle group. Left deltoid shows decreased motor unit recruitment on exertion. Left flexor carpi radialis shows significant decreased motor unit recruitment on exertion. Remaining muscles are normal, including left C5-6 and C7-T1 paraspinal muscles.  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 While carpal tnnel syndrome is a common possible explanation for these findings, the needle EMG examination demonstrates chronic denervation in left deltoid and left flexor carpi radialis muscles. This raises possibility of a more proximal localilzation/pathology (cervical spine, brachial plexus, demyelinating neuropathy such as CIDP). Clinical correlation with neuroimaging and lab testing advised.   INTERPRETING PHYSICIAN:  Penni Bombard, MD Certified in Neurology, Neurophysiology  and Neuroimaging  [facility] [address], [place], [place] [address] [phone]

## 2014-03-08 ENCOUNTER — Ambulatory Visit
Admission: RE | Admit: 2014-03-08 | Discharge: 2014-03-08 | Disposition: A | Discharge status: Home / Self Care | Payer: 59 | Source: Ambulatory Visit | Attending: Diagnostic Neuroimaging | Admitting: Diagnostic Neuroimaging

## 2014-03-08 DIAGNOSIS — R200 Anesthesia of skin: Secondary | ICD-10-CM

## 2014-03-08 DIAGNOSIS — G459 Transient cerebral ischemic attack, unspecified: Secondary | ICD-10-CM

## 2014-03-08 DIAGNOSIS — G40209 Localization-related (focal) (partial) symptomatic epilepsy and epileptic syndromes with complex partial seizures, not intractable, without status epilepticus: Secondary | ICD-10-CM

## 2014-03-08 DIAGNOSIS — R202 Paresthesia of skin: Secondary | ICD-10-CM

## 2014-03-08 MED ORDER — IOHEXOL 350 MG/ML SOLN
100.0000 mL | Freq: Once | INTRAVENOUS | Status: AC | PRN
  Administered 2014-03-08: 100 mL via INTRAVENOUS

## 2014-03-09 ENCOUNTER — Telehealth: Payer: Self-pay | Admitting: Interventional Cardiology

## 2014-03-09 NOTE — Telephone Encounter (Signed)
New message     FYI Pt had to return 2 monitors earlier this week because they did not work.  There were not charging.  He is now waiting for another monitor. April 25th is supposed to be a month--but because of the problems the date has been pushed out further.  Want Dr Tamala Julian to know he is not wearing a monitor at this time.

## 2014-03-10 NOTE — Telephone Encounter (Signed)
Will forward to Dr.Smith for [facility]

## 2014-03-11 NOTE — Progress Notes (Signed)
Quick Note:  I called pt and relayed Dr. Gladstone Lighter note on CTA. Pt verbalized understanding. ______

## 2014-03-23 ENCOUNTER — Ambulatory Visit: Payer: 59 | Admitting: Neurology

## 2014-04-19 ENCOUNTER — Telehealth: Payer: Self-pay

## 2014-04-19 NOTE — Telephone Encounter (Signed)
called to give pt cardiac monitor results.lmom for pt to call back

## 2014-04-20 NOTE — Telephone Encounter (Signed)
F/u ° ° °Pt returning call from nurse. °

## 2014-04-20 NOTE — Telephone Encounter (Signed)
Lm for patient to call back for monitor results.  Lm that Dr. Thompson Caul Medical Assistant Lattie Haw Parris-Godley will be in office this afternoon and will forward message to her.

## 2014-04-21 NOTE — Telephone Encounter (Signed)
pt given cardiac  monitor results.No abnormal rhythm identified.pt verbalized understanding.

## 2014-05-18 ENCOUNTER — Other Ambulatory Visit: Payer: Self-pay

## 2014-05-18 MED ORDER — LISINOPRIL-HYDROCHLOROTHIAZIDE 20-12.5 MG PO TABS: 1.0000 | ORAL_TABLET | Freq: Every day | ORAL | Status: DC

## 2014-10-05 ENCOUNTER — Encounter: Payer: Self-pay | Admitting: Nurse Practitioner

## 2014-10-05 ENCOUNTER — Telehealth: Payer: Self-pay | Admitting: Internal Medicine

## 2014-10-05 ENCOUNTER — Ambulatory Visit (INDEPENDENT_AMBULATORY_CARE_PROVIDER_SITE_OTHER): Payer: 59 | Admitting: Nurse Practitioner

## 2014-10-05 VITALS — BP 113/63 | HR 100 | Ht 64.75 in | Wt 200.0 lb

## 2014-10-05 DIAGNOSIS — G40309 Generalized idiopathic epilepsy and epileptic syndromes, not intractable, without status epilepticus: Secondary | ICD-10-CM

## 2014-10-05 DIAGNOSIS — G40209 Localization-related (focal) (partial) symptomatic epilepsy and epileptic syndromes with complex partial seizures, not intractable, without status epilepticus: Secondary | ICD-10-CM

## 2014-10-05 MED ORDER — PHENYTOIN SODIUM EXTENDED 100 MG PO CAPS: 400.0000 mg | ORAL_CAPSULE | Freq: Every day | ORAL | Status: DC

## 2014-10-05 NOTE — Patient Instructions (Signed)
Continue Dilantin at current dose will refill Reviewed labs from March within normal limits Dilantin level 18 Follow-up yearly and when necessary

## 2014-10-05 NOTE — Progress Notes (Signed)
[facility]  PATIENT: Jason Salazar DOB: 03-30-1946   REASON FOR VISIT: Follow-up for seizure disorder  HISTORY OF PRESENT ILLNESS:Jason Salazar, 68 year old black male returns for followup. He was last seen 10/04/2013.  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 No seizures in several years. He denies any missed doses of his medication. His seizure disorder began at age 79 following a head injury. He denies any side effects to his Dilantin, (BRAND). He denies any falls. He denies any daytime drowsiness. He also is diabetic but says his blood sugars are in good control. Marland KitchenHe gets no regular exercise. He also has a history of high blood pressure and sleep disorder for which he uses CPAP at 13 cm of pressure. He returns for reevaluation.   REVIEW OF SYSTEMS: Full 14 system review of systems performed and notable only for those listed, all others are neg:  Constitutional: N/A  Cardiovascular: N/A  Ear/Nose/Throat: N/A  Skin: N/A  Eyes: N/A  Respiratory: N/A  Gastroitestinal: N/A  Hematology/Lymphatic: N/A  Endocrine: N/A Musculoskeletal: Joint pains, back pain Allergy/Immunology: N/A  Neurological: N/A Psychiatric: N/A Sleep : Obstructive sleep apnea   ALLERGIES: No Known Allergies  HOME MEDICATIONS: Outpatient Prescriptions Prior to Visit  Medication Sig Dispense Refill  . allopurinol (ZYLOPRIM) 100 MG tablet Take 100 mg by mouth daily.       [name] aspirin EC 81 MG tablet Take 81 mg by mouth daily.      . cloNIDine (CATAPRES) 0.1 MG tablet Take 0.1 mg by mouth daily.      . clopidogrel (PLAVIX) 75 MG tablet Take 1 tablet (75 mg total) by mouth daily with breakfast.  30 tablet  0  . colchicine 0.6 MG tablet Take 0.6 mg by mouth 2 (two) times daily.      [name] ezetimibe (ZETIA) 10 MG tablet Take 10 mg by mouth daily.      . fluticasone (FLONASE) 50 MCG/ACT nasal spray Place 1 spray into  both nostrils daily.       [name] JANUMET 50-500 MG per tablet Take 1 tablet by mouth 2 (two) times daily.      [name] lisinopril-hydrochlorothiazide (PRINZIDE,ZESTORETIC) 20-12.5 MG per tablet Take 1 tablet by mouth daily.  30 tablet  6  . phenytoin (DILANTIN) 100 MG ER capsule Take 400 mg by mouth at bedtime.      . TRAVATAN Z 0.004 % SOLN ophthalmic solution Place 1 drop into both eyes at bedtime.       [name] glimepiride (AMARYL) 1 MG tablet Take 1-2 mg by mouth 2 (two) times daily. Take 1 tablet every morning and take 2 tablets every evening       No facility-administered medications prior to visit.    PAST MEDICAL HISTORY: Past Medical History  Diagnosis Date  . Hypertension   . Hyperlipidemia   . Cancer     prostate cancer treated 2007 with radiation seed placement  . Allergic rhinitis   . Sleep apnea   . H/O prostate cancer     Radioactive seeds  . Diabetes   . Balantidiasis   . Hyperlipoproteinemia   . Hypercholesteremia     PAST SURGICAL HISTORY: Past Surgical History  Procedure Laterality Date  . Cyst removed from right kidney      FAMILY HISTORY: Family History  Problem Relation Age of Onset  . CVA Mother   . Sleep apnea Brother   . Thyroid  cancer Father   . Prostate cancer Father     SOCIAL HISTORY: History   Social History  . Marital Status: Married    Spouse Name: [name]    Number of Children: 1  . Years of Education: HS   Occupational History  .  [employer]   Social History Main Topics  . Smoking status: Never Smoker   . Smokeless tobacco: Never Used  . Alcohol Use: No  . Drug Use: No  . Sexual Activity: Not on file   Other Topics Concern  . Not on file   Social History Narrative   Patient is married to [name] and has one son.   Caffeine Use: none; quit a few months ago               PHYSICAL EXAM  Filed Vitals:   10/05/14 1359  BP: 113/63  Pulse: 100  Height: 5' 4.75" (1.645 m)  Weight: 200 lb (90.719 kg)   Body mass  index is 33.52 kg/(m^2). Generalized: Well developed, obese male in no acute distress  Neurological examination  Mentation: Alert oriented to time, place, history taking. Follows all commands speech and language fluent  Cranial nerve II-XII: Pupils were equal round reactive to light extraocular movements were full, visual field were full on confrontational test. Facial sensation and strength were normal. hearing was intact to finger rubbing bilaterally. Uvula tongue midline. head turning and shoulder shrug and were normal and symmetric.Tongue protrusion into cheek strength was normal.  Motor: normal bulk and tone, full strength in the BUE, BLE, fine finger movements normal, no pronator drift. No focal weakness  Coordination: finger-nose-finger, heel-to-shin bilaterally, no dysmetria  Reflexes: Brachioradialis 2/2, biceps 2/2, triceps 2/2, patellar 2/2, Achilles 2/2, plantar responses were flexor bilaterally.  Gait and Station: Rising up from seated position without assistance, normal stance, without trunk ataxia, moderate stride, good arm swing, smooth turning, able to perform tiptoe, and heel walking without difficulty. Tandem gait steady   DIAGNOSTIC DATA (LABS, IMAGING, TESTING) - I reviewed patient records, labs, notes, testing and imaging myself where available.  Lab Results  Component Value Date   WBC 5.3 02/19/2014   HGB 13.2 02/19/2014   HCT 39.9 02/19/2014   MCV 86.2 02/19/2014   PLT 180 02/19/2014      Component Value Date/Time   NA 139 02/19/2014 0843   K 4.7 02/19/2014 0843   CL 101 02/19/2014 0843   CO2 22 02/19/2014 0843   GLUCOSE 110* 02/19/2014 0843   BUN 26* 02/19/2014 0843   CREATININE 1.25 02/19/2014 0843   CALCIUM 8.9 02/19/2014 0843   PROT 6.9 02/19/2014 0843   ALBUMIN 3.9 02/19/2014 0843   AST 27 02/19/2014 0843   ALT 43 02/19/2014 0843   ALKPHOS 124* 02/19/2014 0843   BILITOT <0.2* 02/19/2014 0843   GFRNONAA 58* 02/19/2014 0843   GFRAA 68* 02/19/2014 0843    ASSESSMENT AND  PLAN  68 y.o. year old male  has a past medical history of seizure disorder doing well on Dilantin with no seizures in many years.   Continue Dilantin at current dose will refill Marca Ancona) Reviewed labs from March within normal limits Dilantin level 18 Follow-up yearly and when necessary  Dennie Bible, [facility], [facility], APRN  [facility] [address], [place], [place] [address] [phone]

## 2014-10-05 NOTE — Telephone Encounter (Signed)
lmomtcb x1 

## 2014-10-06 ENCOUNTER — Encounter: Payer: Self-pay | Admitting: Internal Medicine

## 2014-10-06 ENCOUNTER — Other Ambulatory Visit: Payer: Self-pay | Admitting: Nurse Practitioner

## 2014-10-06 NOTE — Telephone Encounter (Signed)
Letter at front desk for pick up and patient's wife is aware. Nothing more needed at this time.

## 2014-10-06 NOTE — Telephone Encounter (Signed)
Spoke with patients wife-she states the patient needs a generic letter to give to 2 different airlines as he will be flying out on 10/30/14 on an 8-12 hour flight and will need to use CPAP while flying. They would like to pick up letter once complete.   They are unsure of which airlines they will be using at this time hence the generic letter needed.   CY please advise once letter is complete. Thanks.

## 2014-10-06 NOTE — Telephone Encounter (Signed)
Wife returning call.  [phone] ext. [place]

## 2014-10-06 NOTE — Telephone Encounter (Signed)
Letter is done- does he want it mailed or pick-up?

## 2014-10-18 ENCOUNTER — Telehealth: Payer: Self-pay | Admitting: Interventional Cardiology

## 2014-10-18 NOTE — Telephone Encounter (Signed)
Routed to Dr.Smith for approval 

## 2014-10-18 NOTE — Telephone Encounter (Signed)
New message     Pt is due for a colonoscopy and is on plavix.  Need clearance to stop plavix if needed.

## 2014-10-19 NOTE — Telephone Encounter (Signed)
Okay to hold plavix 7 days prior to Colonoscopy.

## 2014-10-21 NOTE — Telephone Encounter (Signed)
Lmtcb. Called to give pt Dr.Smith's ok to hold plavix for upcoming colonocopy

## 2014-11-07 NOTE — Telephone Encounter (Signed)
Follow up  ° ° ° °Returning call back to nurse  °

## 2014-11-07 NOTE — Telephone Encounter (Signed)
returned pt call. lmtcb 

## 2014-11-14 NOTE — Progress Notes (Signed)
I reviewed note and agree with plan.   VIKRAM R. PENUMALLI, MD  Certified in Neurology, Neurophysiology and Neuroimaging  [facility] [address], [address] [place], [place] [address] [phone]   

## 2014-12-06 ENCOUNTER — Encounter: Payer: Self-pay | Admitting: Internal Medicine

## 2015-01-06 ENCOUNTER — Ambulatory Visit: Payer: 59 | Admitting: Internal Medicine

## 2015-01-10 ENCOUNTER — Other Ambulatory Visit: Payer: Self-pay | Admitting: *Deleted

## 2015-01-10 MED ORDER — LISINOPRIL-HYDROCHLOROTHIAZIDE 20-12.5 MG PO TABS: 1.0000 | ORAL_TABLET | Freq: Every day | ORAL | Status: DC

## 2015-01-26 ENCOUNTER — Ambulatory Visit: Payer: Self-pay | Admitting: Internal Medicine

## 2015-03-28 ENCOUNTER — Other Ambulatory Visit: Payer: Self-pay | Admitting: Gastroenterology

## 2015-06-05 ENCOUNTER — Other Ambulatory Visit: Payer: Self-pay

## 2015-06-14 ENCOUNTER — Encounter: Payer: Self-pay | Admitting: Internal Medicine

## 2015-08-27 ENCOUNTER — Other Ambulatory Visit: Payer: Self-pay | Admitting: Interventional Cardiology

## 2015-09-23 ENCOUNTER — Other Ambulatory Visit: Payer: Self-pay | Admitting: Interventional Cardiology

## 2015-10-09 ENCOUNTER — Ambulatory Visit (INDEPENDENT_AMBULATORY_CARE_PROVIDER_SITE_OTHER): Payer: 59 | Admitting: Nurse Practitioner

## 2015-10-09 ENCOUNTER — Encounter: Payer: Self-pay | Admitting: Nurse Practitioner

## 2015-10-09 VITALS — BP 138/69 | HR 105 | Ht 64.0 in | Wt 190.8 lb

## 2015-10-09 DIAGNOSIS — Z5181 Encounter for therapeutic drug level monitoring: Secondary | ICD-10-CM

## 2015-10-09 DIAGNOSIS — G40209 Localization-related (focal) (partial) symptomatic epilepsy and epileptic syndromes with complex partial seizures, not intractable, without status epilepticus: Secondary | ICD-10-CM | POA: Diagnosis not present

## 2015-10-09 DIAGNOSIS — G40309 Generalized idiopathic epilepsy and epileptic syndromes, not intractable, without status epilepticus: Secondary | ICD-10-CM

## 2015-10-09 MED ORDER — PHENYTOIN SODIUM EXTENDED 100 MG PO CAPS: 400.0000 mg | ORAL_CAPSULE | Freq: Every day | ORAL | Status: DC

## 2015-10-09 NOTE — Patient Instructions (Signed)
Will check labs Call for seizure activity Continue Dilantin at current dose will refill Follow up 1 year

## 2015-10-09 NOTE — Progress Notes (Signed)
[facility]  PATIENT: Jason Salazar DOB: 1946-07-30   REASON FOR VISIT: Follow-up for generalized epilepsy, partial epilepsy HISTORY FROM: Patient    HISTORY OF PRESENT ILLNESS:Ms. Sittner, 69year-old black male returns for followup. He was last seen 10/05/2014. 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 No seizures in several years. He denies any missed doses of his medication. His seizure disorder began at age 69 following a head injury. He denies any side effects to his Dilantin, (BRAND). He denies any falls. He denies any daytime drowsiness. He also is diabetic but says his blood sugars are in good control. Marland KitchenHe gets no regular exercise. He has had some numbness in his feet no pain. He also has a history of high blood pressure and sleep disorder for which he uses CPAP at 13 cm of pressure. He returns for reevaluation.    REVIEW OF SYSTEMS: Full 14 system review of systems performed and notable only for those listed, all others are neg:  Constitutional: neg  Cardiovascular: neg Ear/Nose/Throat: neg  Skin: neg Eyes: neg Respiratory: neg Gastroitestinal: neg  Hematology/Lymphatic: neg  Endocrine: Diabetic for 7 years Musculoskeletal:neg Allergy/Immunology: neg Neurological: Numbness in the feet Psychiatric: neg Sleep : Obstructive sleep apnea with CPAP ALLERGIES: No Known Allergies  HOME MEDICATIONS: Outpatient Prescriptions Prior to Visit  Medication Sig Dispense Refill  . allopurinol (ZYLOPRIM) 100 MG tablet Take 100 mg by mouth daily.     . cloNIDine (CATAPRES) 0.1 MG tablet Take 0.1 mg by mouth daily.    . clopidogrel (PLAVIX) 75 MG tablet Take 1 tablet (75 mg total) by mouth daily with breakfast. 30 tablet 0  . colchicine 0.6 MG tablet Take 0.6 mg by mouth 2 (two) times daily.    [name] DILANTIN 100 MG ER capsule TAKE 4 CAPSULES BY MOUTH AT BEDTIME 120 capsule 12  .  ezetimibe (ZETIA) 10 MG tablet Take 10 mg by mouth daily.    . fluticasone (FLONASE) 50 MCG/ACT nasal spray Place 1 spray into both nostrils daily.     [name] lisinopril-hydrochlorothiazide (PRINZIDE,ZESTORETIC) 20-12.5 MG tablet TAKE 1 TABLET BY MOUTH DAILY. 14 tablet 0  . phenytoin (DILANTIN) 100 MG ER capsule Take 4 capsules (400 mg total) by mouth at bedtime. 360 capsule 3  . TRAVATAN Z 0.004 % SOLN ophthalmic solution Place 1 drop into both eyes at bedtime.     [name] aspirin EC 81 MG tablet Take 81 mg by mouth daily.    [name] JANUMET 50-500 MG per tablet Take 1 tablet by mouth 2 (two) times daily.     No facility-administered medications prior to visit.    PAST MEDICAL HISTORY: Past Medical History  Diagnosis Date  . Hypertension   . Hyperlipidemia   . Cancer [facility])     prostate cancer treated 2007 with radiation seed placement  . Allergic rhinitis   . Sleep apnea   . H/O prostate cancer     Radioactive seeds  . Diabetes ([place])   . Balantidiasis   . Hyperlipoproteinemia   . Hypercholesteremia     PAST SURGICAL HISTORY: Past Surgical History  Procedure Laterality Date  . Cyst removed from right kidney      FAMILY HISTORY: Family History  Problem Relation Age of Onset  . CVA Mother   . Sleep apnea Brother   . Thyroid cancer Father   . Prostate cancer Father     SOCIAL HISTORY: Social History  Social History  . Marital Status: Married    Spouse Name: [name]  . Number of Children: 1  . Years of Education: HS   Occupational History  .  [employer]   Social History Main Topics  . Smoking status: Never Smoker   . Smokeless tobacco: Never Used  . Alcohol Use: No  . Drug Use: No  . Sexual Activity: Not on file   Other Topics Concern  . Not on file   Social History Narrative   Patient is married to [place] and has one son.   Caffeine Use: none; quit a few months ago               PHYSICAL EXAM  Filed Vitals:   10/09/15 1320  BP: 138/69  Pulse:  105  Height: 5\' 4"  (1.626 m)  Weight: 190 lb 12.8 oz (86.546 kg)   Body mass index is 32.73 kg/(m^2). Generalized: Well developed, obese male in no acute distress  Neurological examination  Mentation: Alert oriented to time, place, history taking. Follows all commands speech and language fluent  Cranial nerve II-XII: Pupils were equal round reactive to light extraocular movements were full, visual field were full on confrontational test. Facial sensation and strength were normal. hearing was intact to finger rubbing bilaterally. Uvula tongue midline. head turning and shoulder shrug and were normal and symmetric.Tongue protrusion into cheek strength was normal.  Motor: normal bulk and tone, full strength in the BUE, BLE, fine finger movements normal, no pronator drift. No focal weakness  Sensory intact to touch, pinprick and vibratory to lower extremities Coordination: finger-nose-finger, heel-to-shin bilaterally, no dysmetria  Reflexes: Brachioradialis 2/2, biceps 2/2, triceps 2/2, patellar 2/2, Achilles 2/2, plantar responses were flexor bilaterally.  Gait and Station: Rising up from seated position without assistance, normal stance, without trunk ataxia, moderate stride, good arm swing, smooth turning, able to perform tiptoe, and heel walking without difficulty. Tandem gait steady  DIAGNOSTIC DATA (LABS, IMAGING, TESTING)     ASSESSMENT AND PLAN  69 y.o. year old male  has a past medical history of Hypertension; Hyperlipidemia;  Sleep apnea; H/O prostate cancer; Diabetes ([place]);  Hyperlipoproteinemia; and Hypercholesteremia. here to follow-up for his seizure disorder. No seizures in several years  Will check labs, CBC, CMP and Dilantin level Call for seizure activity Continue Dilantin at current dose will refill Follow up 1 year Dennie Bible, [facility], [facility], [facility] [address], [place], [place] [address] [phone]

## 2015-10-10 ENCOUNTER — Telehealth: Payer: Self-pay | Admitting: *Deleted

## 2015-10-10 LAB — CBC WITH DIFFERENTIAL/PLATELET
BASOS: 0 %
Basophils Absolute: 0 10*3/uL (ref 0.0–0.2)
EOS (ABSOLUTE): 0 10*3/uL (ref 0.0–0.4)
EOS: 0 %
HEMATOCRIT: 39.2 % (ref 37.5–51.0)
HEMOGLOBIN: 13.4 g/dL (ref 12.6–17.7)
Immature Grans (Abs): 0 10*3/uL (ref 0.0–0.1)
Immature Granulocytes: 0 %
LYMPHS ABS: 1.1 10*3/uL (ref 0.7–3.1)
Lymphs: 23 %
MCH: 29 pg (ref 26.6–33.0)
MCHC: 34.2 g/dL (ref 31.5–35.7)
MCV: 85 fL (ref 79–97)
MONOS ABS: 0.4 10*3/uL (ref 0.1–0.9)
Monocytes: 9 %
NEUTROS PCT: 68 %
Neutrophils Absolute: 3 10*3/uL (ref 1.4–7.0)
Platelets: 172 10*3/uL (ref 150–379)
RBC: 4.62 x10E6/uL (ref 4.14–5.80)
RDW: 13.7 % (ref 12.3–15.4)
WBC: 4.5 10*3/uL (ref 3.4–10.8)

## 2015-10-10 LAB — COMPREHENSIVE METABOLIC PANEL
A/G RATIO: 1.8 (ref 1.1–2.5)
ALBUMIN: 4.4 g/dL (ref 3.6–4.8)
ALT: 17 IU/L (ref 0–44)
AST: 16 IU/L (ref 0–40)
Alkaline Phosphatase: 95 IU/L (ref 39–117)
BUN / CREAT RATIO: 13 (ref 10–22)
BUN: 15 mg/dL (ref 8–27)
Bilirubin Total: <0.2 mg/dL (ref 0.0–1.2)
CO2: 22 mmol/L (ref 18–29)
CREATININE: 1.17 mg/dL (ref 0.76–1.27)
Calcium: 9.3 mg/dL (ref 8.6–10.2)
Chloride: 100 mmol/L (ref 97–106)
GFR calc Af Amer: 74 mL/min/{1.73_m2} (ref >59)
GFR calc non Af Amer: 64 mL/min/{1.73_m2} (ref >59)
GLOBULIN, TOTAL: 2.5 g/dL (ref 1.5–4.5)
Glucose: 127 mg/dL — ABNORMAL HIGH (ref 65–99)
POTASSIUM: 4.3 mmol/L (ref 3.5–5.2)
SODIUM: 140 mmol/L (ref 136–144)
Total Protein: 6.9 g/dL (ref 6.0–8.5)

## 2015-10-10 LAB — PHENYTOIN LEVEL, TOTAL: Phenytoin (Dilantin), Serum: 14.1 ug/mL (ref 10.0–20.0)

## 2015-10-10 NOTE — Telephone Encounter (Signed)
LMVM for pt to call for lab results. ( labs look good per CM/NP).  May give results when he returns call.

## 2015-10-10 NOTE — Telephone Encounter (Signed)
Pt returned Sandy's call. Information relayed to pt. Pt understood

## 2015-10-10 NOTE — Telephone Encounter (Signed)
-----   Message from Dennie Bible, NP sent at 10/10/2015  8:07 AM EDT ----- Labs look good please call the patient

## 2015-10-15 ENCOUNTER — Other Ambulatory Visit: Payer: Self-pay | Admitting: Interventional Cardiology

## 2015-10-19 ENCOUNTER — Other Ambulatory Visit: Payer: Self-pay | Admitting: Interventional Cardiology

## 2015-10-19 NOTE — Telephone Encounter (Signed)
After being given an rx for only #7, patient has still failed to schedule an appointment. Please advise. Thanks, MI

## 2015-10-20 NOTE — Progress Notes (Signed)
I reviewed note and agree with plan.   Penni Bombard, MD [id number], [id number] PM Certified in Neurology, Neurophysiology and Neuroimaging  [facility] [address] [place], [place] [address] [phone]

## 2015-11-04 ENCOUNTER — Other Ambulatory Visit: Payer: Self-pay | Admitting: Interventional Cardiology

## 2015-11-06 NOTE — Telephone Encounter (Signed)
Patient has been given multiple rx's for #7 and #14 with a note to call and schedule an appointment. Patient has still failed to do so. Please advise. Thanks, MI

## 2015-11-07 ENCOUNTER — Other Ambulatory Visit: Payer: Self-pay

## 2015-11-07 MED ORDER — LISINOPRIL-HYDROCHLOROTHIAZIDE 20-12.5 MG PO TABS: 1.0000 | ORAL_TABLET | Freq: Every day | ORAL | Status: DC

## 2016-10-08 ENCOUNTER — Ambulatory Visit: Payer: 59 | Admitting: Nurse Practitioner

## 2016-10-09 ENCOUNTER — Encounter: Payer: Self-pay | Admitting: Nurse Practitioner

## 2016-10-14 ENCOUNTER — Ambulatory Visit: Payer: 59 | Admitting: Nurse Practitioner

## 2016-10-14 DIAGNOSIS — Z0289 Encounter for other administrative examinations: Secondary | ICD-10-CM

## 2016-10-15 ENCOUNTER — Encounter: Payer: Self-pay | Admitting: Nurse Practitioner

## 2016-10-21 ENCOUNTER — Encounter: Payer: Self-pay | Admitting: Nurse Practitioner

## 2016-10-21 ENCOUNTER — Ambulatory Visit (INDEPENDENT_AMBULATORY_CARE_PROVIDER_SITE_OTHER): Payer: 59 | Admitting: Nurse Practitioner

## 2016-10-21 VITALS — BP 141/70 | HR 97 | Ht 64.0 in | Wt 190.4 lb

## 2016-10-21 DIAGNOSIS — G40209 Localization-related (focal) (partial) symptomatic epilepsy and epileptic syndromes with complex partial seizures, not intractable, without status epilepticus: Secondary | ICD-10-CM

## 2016-10-21 DIAGNOSIS — Z5181 Encounter for therapeutic drug level monitoring: Secondary | ICD-10-CM

## 2016-10-21 DIAGNOSIS — G40309 Generalized idiopathic epilepsy and epileptic syndromes, not intractable, without status epilepticus: Secondary | ICD-10-CM | POA: Diagnosis not present

## 2016-10-21 MED ORDER — DILANTIN 100 MG PO CAPS: ORAL_CAPSULE | ORAL | 12 refills | Status: DC

## 2016-10-21 NOTE — Patient Instructions (Signed)
Will check labs, CBC, CMP and Dilantin level Call for seizure activity Continue Dilantin at current dose will refill Follow up 1 year

## 2016-10-21 NOTE — Progress Notes (Signed)
[facility]  PATIENT: Jason Salazar DOB: 10-18-1946   REASON FOR VISIT: Follow-up for generalized epilepsy, partial epilepsy HISTORY FROM: Patient    HISTORY OF PRESENT ILLNESS:Ms. Ficek, 70year-old black male returns for  yearly followup. 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 No seizures in several years. He denies any missed doses of his medication. His seizure disorder began at age 54 following a head injury. He denies any side effects to his Dilantin, (BRAND). He denies any falls. He denies any daytime drowsiness. He also is diabetic but says his blood sugars are in good control. [name] He claims his most recent A1c was 6.1.  He gets no regular exercise. He has had some numbness in his feet no pain. He also has a history of high blood pressure and sleep disorder for which he uses CPAP at 13 cm of pressure.  he is due to retire at the end of the year and he is looking forward to that He returns for reevaluation, labs and refills.    REVIEW OF SYSTEMS: Full 14 system review of systems performed and notable only for those listed, all others are neg:  Constitutional: neg  Cardiovascular: neg Ear/Nose/Throat: neg  Skin: neg Eyes: neg Respiratory: neg Gastroitestinal: neg  Hematology/Lymphatic: neg  Endocrine: Diabetic for 8 years Musculoskeletal:neg Allergy/Immunology: neg Neurological: Numbness in the feet, history of seizure disorder  Psychiatric: neg Sleep : Obstructive sleep apnea with CPAP ALLERGIES: No Known Allergies  HOME MEDICATIONS: Outpatient Medications Prior to Visit  Medication Sig Dispense Refill  . allopurinol (ZYLOPRIM) 100 MG tablet Take 100 mg by mouth daily.     . cloNIDine (CATAPRES) 0.1 MG tablet Take 0.1 mg by mouth daily.    . colchicine 0.6 MG tablet Take 0.6 mg by mouth 2 (two) times daily.    [name] DILANTIN 100 MG ER capsule TAKE 4 CAPSULES BY MOUTH  AT BEDTIME 120 capsule 12  . ezetimibe (ZETIA) 10 MG tablet Take 10 mg by mouth daily.    . fluticasone (FLONASE) 50 MCG/ACT nasal spray Place 1 spray into both nostrils daily.     [name] JANUMET XR 50-1000 MG TB24 Take 1 tablet by mouth 2 (two) times daily with a meal.  12  . lisinopril-hydrochlorothiazide (PRINZIDE,ZESTORETIC) 20-12.5 MG tablet Take 1 tablet by mouth daily. 7 tablet 0  . loratadine (CLARITIN) 10 MG tablet TAKE 1 TABLET EVERY DAY AS NEEDED  5  . TRAVATAN Z 0.004 % SOLN ophthalmic solution Place 1 drop into both eyes at bedtime.     . clopidogrel (PLAVIX) 75 MG tablet Take 1 tablet (75 mg total) by mouth daily with breakfast. (Patient not taking: Reported on 10/21/2016) 30 tablet 0  . phenytoin (DILANTIN) 100 MG ER capsule Take 4 capsules (400 mg total) by mouth at bedtime. 360 capsule 3   No facility-administered medications prior to visit.     PAST MEDICAL HISTORY: Past Medical History:  Diagnosis Date  . Allergic rhinitis   . Balantidiasis   . Cancer [facility])    prostate cancer treated 2007 with radiation seed placement  . Diabetes ([place])   . H/O prostate cancer    Radioactive seeds  . Hypercholesteremia   . Hyperlipidemia   . Hyperlipoproteinemia   . Hypertension   . Sleep apnea     PAST SURGICAL HISTORY: Past Surgical History:  Procedure Laterality Date  . cyst removed from right kidney  FAMILY HISTORY: Family History  Problem Relation Age of Onset  . CVA Mother   . Sleep apnea Brother   . Thyroid cancer Father   . Prostate cancer Father     SOCIAL HISTORY: Social History   Social History  . Marital status: Married    Spouse name: [name]  . Number of children: 1  . Years of education: HS   Occupational History  .  [employer]   Social History Main Topics  . Smoking status: Never Smoker  . Smokeless tobacco: Never Used  . Alcohol use No  . Drug use: No  . Sexual activity: Not on file   Other Topics Concern  . Not on file    Social History Narrative   Patient is married to [place] and has one son.   Caffeine Use: none; quit a few months ago               PHYSICAL EXAM  Vitals:   69/13/17 0921  BP: (!) 141/70  Pulse: 97  Weight: 190 lb 6.4 oz (86.4 kg)  Height: 5\' 4"  (1.626 m)   Body mass index is 32.68 kg/m. Generalized: Well developed, obese male in no acute distress  Neurological examination  Mentation: Alert oriented to time, place, history taking. Follows all commands speech and language fluent  Cranial nerve II-XII: Pupils were equal round reactive to light extraocular movements were full, visual field were full on confrontational test. Facial sensation and strength were normal. hearing was intact to finger rubbing bilaterally. Uvula tongue midline. head turning and shoulder shrug and were normal and symmetric.Tongue protrusion into cheek strength was normal.  Motor: normal bulk and tone, full strength in the BUE, BLE, fine finger movements normal, no pronator drift. No focal weakness  Sensory intact to touch, pinprick and vibratory to lower extremities Coordination: finger-nose-finger, heel-to-shin bilaterally, no dysmetria  Reflexes: Brachioradialis 2/2, biceps 2/2, triceps 2/2, patellar 2/2, Achilles 2/2, plantar responses were flexor bilaterally.  Gait and Station: Rising up from seated position without assistance, normal stance, moderate stride, good arm swing, smooth turning, able to perform tiptoe, and heel walking without difficulty. Tandem gait steady  DIAGNOSTIC DATA (LABS, IMAGING, TESTING)     ASSESSMENT AND PLAN  70 y.o. year old male  has a past medical history of Hypertension; Hyperlipidemia;  Sleep apnea; H/O prostate cancer; Diabetes ([place]);  Hyperlipoproteinemia; and Hypercholesteremia. here to follow-up for his seizure disorder. No seizures in several years  Will check labs, CBC, CMP and Dilantin level Call for seizure activity Continue Dilantin at current dose  will refill BRAND Follow up 1 year Dennie Bible, [facility], [facility], APRN  [facility] [address], [place], [place] [address] [phone]

## 2016-10-22 ENCOUNTER — Telehealth: Payer: Self-pay | Admitting: *Deleted

## 2016-10-22 LAB — CBC WITH DIFFERENTIAL/PLATELET
BASOS ABS: 0 10*3/uL (ref 0.0–0.2)
Basos: 0 %
EOS (ABSOLUTE): 0 10*3/uL (ref 0.0–0.4)
EOS: 0 %
HEMATOCRIT: 39.4 % (ref 37.5–51.0)
Hemoglobin: 13.2 g/dL (ref 12.6–17.7)
IMMATURE GRANULOCYTES: 0 %
Immature Grans (Abs): 0 10*3/uL (ref 0.0–0.1)
Lymphocytes Absolute: 1.2 10*3/uL (ref 0.7–3.1)
Lymphs: 21 %
MCH: 28.3 pg (ref 26.6–33.0)
MCHC: 33.5 g/dL (ref 31.5–35.7)
MCV: 85 fL (ref 79–97)
MONOCYTES: 9 %
MONOS ABS: 0.5 10*3/uL (ref 0.1–0.9)
NEUTROS PCT: 70 %
Neutrophils Absolute: 4 10*3/uL (ref 1.4–7.0)
Platelets: 164 10*3/uL (ref 150–379)
RBC: 4.66 x10E6/uL (ref 4.14–5.80)
RDW: 14.4 % (ref 12.3–15.4)
WBC: 5.7 10*3/uL (ref 3.4–10.8)

## 2016-10-22 LAB — COMPREHENSIVE METABOLIC PANEL
ALK PHOS: 135 IU/L — AB (ref 39–117)
ALT: 33 IU/L (ref 0–44)
AST: 21 IU/L (ref 0–40)
Albumin/Globulin Ratio: 1.6 (ref 1.2–2.2)
Albumin: 4.3 g/dL (ref 3.6–4.8)
BUN/Creatinine Ratio: 14 (ref 10–24)
BUN: 16 mg/dL (ref 8–27)
Bilirubin Total: <0.2 mg/dL (ref 0.0–1.2)
CHLORIDE: 99 mmol/L (ref 96–106)
CO2: 22 mmol/L (ref 18–29)
CREATININE: 1.14 mg/dL (ref 0.76–1.27)
Calcium: 9 mg/dL (ref 8.6–10.2)
GFR calc Af Amer: 76 mL/min/{1.73_m2} (ref >59)
GFR calc non Af Amer: 66 mL/min/{1.73_m2} (ref >59)
GLUCOSE: 117 mg/dL — AB (ref 65–99)
Globulin, Total: 2.7 g/dL (ref 1.5–4.5)
Potassium: 5 mmol/L (ref 3.5–5.2)
Sodium: 140 mmol/L (ref 134–144)
Total Protein: 7 g/dL (ref 6.0–8.5)

## 2016-10-22 LAB — PHENYTOIN LEVEL, TOTAL: PHENYTOIN (DILANTIN), SERUM: 16.4 ug/mL (ref 10.0–20.0)

## 2016-10-22 NOTE — Telephone Encounter (Signed)
-----   Message from Dennie Bible, NP sent at 10/22/2016  8:09 AM EST ----- Labs look good. Please call patient

## 2016-10-22 NOTE — Telephone Encounter (Signed)
Spoke to pt and gave lab results to pt (they looked good).  He verbalized understanding.

## 2016-10-24 NOTE — Progress Notes (Signed)
I reviewed note and agree with plan.   VIKRAM R. PENUMALLI, MD  Certified in Neurology, Neurophysiology and Neuroimaging  [facility] [address], [address] [place], [place] [address] [phone]   

## 2016-11-27 ENCOUNTER — Other Ambulatory Visit: Payer: Self-pay | Admitting: Nurse Practitioner

## 2016-12-04 ENCOUNTER — Other Ambulatory Visit: Payer: Self-pay | Admitting: Nurse Practitioner

## 2017-05-06 ENCOUNTER — Encounter: Payer: Self-pay | Admitting: Cardiology

## 2017-05-21 ENCOUNTER — Telehealth: Payer: Self-pay | Admitting: *Deleted

## 2017-05-21 NOTE — Telephone Encounter (Signed)
-----   Message from Sueanne Margarita, MD sent at 05/20/2017  3:05 PM EDT ----- Good AHI and compliance.  Continue current CPAP settings.

## 2017-05-21 NOTE — Telephone Encounter (Signed)
Informed patient of compliance results and patient understanding was verbalized. Patient understands his current settings will not change.

## 2017-06-06 ENCOUNTER — Encounter: Payer: Self-pay | Admitting: Nurse Practitioner

## 2017-10-21 ENCOUNTER — Ambulatory Visit: Payer: 59 | Admitting: Nurse Practitioner

## 2017-10-28 NOTE — Progress Notes (Signed)
[facility]  PATIENT: Jason Salazar DOB: 1946/09/26   REASON FOR VISIT: Follow-up for generalized epilepsy, partial epilepsy HISTORY FROM: Patient    HISTORY OF PRESENT ILLNESS:Ms. Jason Salazar, 71year-old black male returns for  yearly followup. 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 No seizures in several years. He denies any missed doses of his medication. His seizure disorder began at age 71 following a head injury. He denies any side effects to his Dilantin, (BRAND). He denies any falls. He denies any daytime drowsiness. He also is diabetic but says his blood sugars are in good control. Jason [place]  He gets no regular exercise. He has had some numbness in his feet no pain. He also has a history of high blood pressure and sleep disorder for which he uses CPAP at 13 cm of pressure. He is  Retired. He returns for reevaluation, labs and refills.    REVIEW OF SYSTEMS: Full 14 system review of systems performed and notable only for those listed, all others are neg:  Constitutional: neg  Cardiovascular: neg Ear/Nose/Throat: neg  Skin: neg Eyes: neg Respiratory: neg Gastroitestinal: neg  Hematology/Lymphatic: neg  Endocrine: Diabetic for 9 years Musculoskeletal:neg Allergy/Immunology: neg Neurological: Numbness in the feet, history of seizure disorder  Psychiatric: neg Sleep : Obstructive sleep apnea with CPAP ALLERGIES: No Known Allergies  HOME MEDICATIONS: Outpatient Medications Prior to Visit  Medication Sig Dispense Refill  . allopurinol (ZYLOPRIM) 100 MG tablet Take 100 mg by mouth daily.     Jason Salazar aspirin EC 81 MG tablet Take 81 mg by mouth daily.    . cloNIDine (CATAPRES) 0.1 MG tablet Take 0.1 mg by mouth daily.    . colchicine 0.6 MG tablet Take 0.6 mg by mouth 2 (two) times daily.    Jason Salazar DILANTIN 100 MG ER capsule TAKE 4 CAPSULES (400 MG TOTAL) BY MOUTH AT BEDTIME. 360 capsule 3  .  ezetimibe (ZETIA) 10 MG tablet Take 10 mg by mouth daily.    . fluticasone (FLONASE) 50 MCG/ACT nasal spray Place 1 spray into both nostrils daily.     Jason Salazar lisinopril-hydrochlorothiazide (PRINZIDE,ZESTORETIC) 20-12.5 MG tablet Take 1 tablet by mouth daily. 7 tablet 0  . loratadine (CLARITIN) 10 MG tablet TAKE 1 TABLET EVERY DAY AS NEEDED  5  . sitaGLIPtin-metformin (JANUMET) 50-500 MG tablet Take 1 tablet by mouth 2 (two) times daily with a meal.    . TRAVATAN Z 0.004 % SOLN ophthalmic solution Place 1 drop into both eyes at bedtime.     Jason Salazar JANUMET XR 50-1000 MG TB24 Take 1 tablet by mouth 2 (two) times daily with a meal.  12   No facility-administered medications prior to visit.     PAST MEDICAL HISTORY: Past Medical History:  Diagnosis Date  . Allergic rhinitis   . Balantidiasis   . Cancer [facility])    prostate cancer treated 2007 with radiation seed placement  . Diabetes ([place])   . H/O prostate cancer    Radioactive seeds  . Hypercholesteremia   . Hyperlipidemia   . Hyperlipoproteinemia   . Hypertension   . Sleep apnea     PAST SURGICAL HISTORY: Past Surgical History:  Procedure Laterality Date  . cyst removed from right kidney      FAMILY HISTORY: Family History  Problem Relation Age of Onset  . CVA Mother   . Sleep apnea Brother   . Thyroid cancer Father   . Prostate  cancer Father     SOCIAL HISTORY: Social History   Socioeconomic History  . Marital status: Married    Spouse name: Jason Salazar  . Number of children: 1  . Years of education: HS  . Highest education level: Not on file  Social Needs  . Financial resource strain: Not on file  . Food insecurity - worry: Not on file  . Food insecurity - inability: Not on file  . Transportation needs - medical: Not on file  . Transportation needs - non-medical: Not on file  Occupational History    Employer: [employer]  Tobacco Use  . Smoking status: Never Smoker  . Smokeless tobacco: Never Used  Substance and  Sexual Activity  . Alcohol use: No  . Drug use: No  . Sexual activity: Not on file  Other Topics Concern  . Not on file  Social History Narrative   Patient is married to Jason Salazar and has one son.   Caffeine Use: none; quit a few months ago               PHYSICAL EXAM  Vitals:   10/29/17 1005  BP: 115/63  Pulse: 95  Weight: 195 lb 6.4 oz (88.6 kg)  Height: 5\' 4"  (1.626 m)   Body mass index is 33.54 kg/m. Generalized: Well developed, obese male in no acute distress  Neurological examination  Mentation: Alert oriented to time, place, history taking. Follows all commands speech and language fluent  Cranial nerve II-XII: Pupils were equal round reactive to light extraocular movements were full, visual field were full on confrontational test. Facial sensation and strength were normal. hearing was intact to finger rubbing bilaterally. Uvula tongue midline. head turning and shoulder shrug and were normal and symmetric.Tongue protrusion into cheek strength was normal.  Motor: normal bulk and tone, full strength in the BUE, BLE, Sensory intact to touch, Coordination: finger-nose-finger, heel-to-shin bilaterally, no dysmetria  Reflexes: Symmetric upper and lower, plantar responses were flexor bilaterally.  Gait and Station: Rising up from seated position without assistance, normal stance, moderate stride, good arm swing, smooth turning, able to perform tiptoe, and heel walking without difficulty. Tandem gait steady  DIAGNOSTIC DATA (LABS, IMAGING, TESTING)     ASSESSMENT AND PLAN  71 y.o. year old male  has a past medical history of Hypertension; Hyperlipidemia;  Sleep apnea; H/O prostate cancer; Diabetes ([place]);  Hyperlipoproteinemia; and Hypercholesteremia. here to follow-up for his seizure disorder. No seizures in several years. The patient is a current patient of Dr. Leta Baptist  who is out of the office today . This note is sent to the work in doctor.     Will check labs,  CBC, CMP to monitor adverse effects of Dilantin  Dilantin level to monitor for therapeutic level/toxicity Call for seizure activity Continue Dilantin at current dose will refill BRAND Follow up 1 year Dennie Bible, [facility], [facility], APRN  [facility] [address], [place], [place] [address] [phone]

## 2017-10-29 ENCOUNTER — Encounter: Payer: Self-pay | Admitting: Nurse Practitioner

## 2017-10-29 ENCOUNTER — Ambulatory Visit (INDEPENDENT_AMBULATORY_CARE_PROVIDER_SITE_OTHER): Payer: Medicare HMO | Admitting: Nurse Practitioner

## 2017-10-29 VITALS — BP 115/63 | HR 95 | Ht 64.0 in | Wt 195.4 lb

## 2017-10-29 DIAGNOSIS — Z5181 Encounter for therapeutic drug level monitoring: Secondary | ICD-10-CM | POA: Diagnosis not present

## 2017-10-29 DIAGNOSIS — G40309 Generalized idiopathic epilepsy and epileptic syndromes, not intractable, without status epilepticus: Secondary | ICD-10-CM | POA: Diagnosis not present

## 2017-10-29 MED ORDER — DILANTIN 100 MG PO CAPS: 400.0000 mg | ORAL_CAPSULE | Freq: Every day | ORAL | 3 refills | Status: DC

## 2017-10-29 NOTE — Progress Notes (Signed)
Personally  participated in, made any corrections needed, and agree with history, physical, neuro exam,assessment and plan as stated above.    Antonia Ahern, MD [facility] 

## 2017-10-29 NOTE — Patient Instructions (Signed)
Will check labs, CBC, CMP and Dilantin level Call for seizure activity Continue Dilantin at current dose will refill BRAND Follow up 1 year

## 2017-10-30 LAB — COMPREHENSIVE METABOLIC PANEL
ALBUMIN: 4.4 g/dL (ref 3.6–4.8)
ALT: 34 IU/L (ref 0–44)
AST: 21 IU/L (ref 0–40)
Albumin/Globulin Ratio: 1.8 (ref 1.2–2.2)
Alkaline Phosphatase: 114 IU/L (ref 39–117)
BUN/Creatinine Ratio: 16 (ref 10–24)
BUN: 22 mg/dL (ref 8–27)
CALCIUM: 9.4 mg/dL (ref 8.6–10.2)
CO2: 22 mmol/L (ref 20–29)
Chloride: 106 mmol/L (ref 96–106)
Creatinine, Ser: 1.39 mg/dL — ABNORMAL HIGH (ref 0.76–1.27)
GFR calc Af Amer: 59 mL/min/{1.73_m2} — ABNORMAL LOW (ref >59)
GFR, EST NON AFRICAN AMERICAN: 51 mL/min/{1.73_m2} — AB (ref >59)
GLUCOSE: 158 mg/dL — AB (ref 65–99)
Globulin, Total: 2.4 g/dL (ref 1.5–4.5)
POTASSIUM: 4.9 mmol/L (ref 3.5–5.2)
SODIUM: 141 mmol/L (ref 134–144)
TOTAL PROTEIN: 6.8 g/dL (ref 6.0–8.5)

## 2017-10-30 LAB — CBC WITH DIFFERENTIAL/PLATELET
Basophils Absolute: 0 10*3/uL (ref 0.0–0.2)
Basos: 0 %
EOS (ABSOLUTE): 0 10*3/uL (ref 0.0–0.4)
EOS: 0 %
HEMATOCRIT: 38.8 % (ref 37.5–51.0)
Hemoglobin: 12.7 g/dL — ABNORMAL LOW (ref 13.0–17.7)
Immature Grans (Abs): 0 10*3/uL (ref 0.0–0.1)
Immature Granulocytes: 0 %
LYMPHS ABS: 1.1 10*3/uL (ref 0.7–3.1)
Lymphs: 24 %
MCH: 28 pg (ref 26.6–33.0)
MCHC: 32.7 g/dL (ref 31.5–35.7)
MCV: 86 fL (ref 79–97)
MONOS ABS: 0.5 10*3/uL (ref 0.1–0.9)
Monocytes: 10 %
Neutrophils Absolute: 3.1 10*3/uL (ref 1.4–7.0)
Neutrophils: 66 %
Platelets: 156 10*3/uL (ref 150–379)
RBC: 4.54 x10E6/uL (ref 4.14–5.80)
RDW: 14.7 % (ref 12.3–15.4)
WBC: 4.7 10*3/uL (ref 3.4–10.8)

## 2017-10-30 LAB — PHENYTOIN LEVEL, TOTAL: PHENYTOIN (DILANTIN), SERUM: 16.2 ug/mL (ref 10.0–20.0)

## 2017-11-03 ENCOUNTER — Telehealth: Payer: Self-pay | Admitting: *Deleted

## 2017-11-03 NOTE — Telephone Encounter (Addendum)
LVM requesting patient call back for lab results.  Successfully faxed labs to PCP, per Daun Peacock, NP.

## 2017-11-04 NOTE — Telephone Encounter (Signed)
LVM advising patient that his lab results were released to his My Chart for his review. Advised a copy was faxed to his PCP. Advised he call back for any questions.

## 2017-11-05 NOTE — Telephone Encounter (Signed)
Pt has returned the call to [place], he is asking for a call back

## 2017-11-05 NOTE — Telephone Encounter (Signed)
LVM for patient

## 2017-11-05 NOTE — Telephone Encounter (Signed)
Spoke with patient and informed him that his lab results were okay with exception of mildly elevated kidney function. Advised a copy was faxed to his PCP to follow up. He asked what that meant. This RN advised she cannot diagnose for a lab result, advised he may have been dehydrated but to FU with PCP. Patient verbalized understanding, appreciation.

## 2017-11-05 NOTE — Telephone Encounter (Signed)
Pt returned RN's call °

## 2017-11-13 ENCOUNTER — Other Ambulatory Visit: Payer: Self-pay | Admitting: Nurse Practitioner

## 2018-04-20 ENCOUNTER — Ambulatory Visit: Payer: Medicare HMO | Admitting: Podiatry

## 2018-04-20 ENCOUNTER — Encounter: Payer: Self-pay | Admitting: Podiatry

## 2018-04-20 DIAGNOSIS — M792 Neuralgia and neuritis, unspecified: Secondary | ICD-10-CM

## 2018-04-20 DIAGNOSIS — E119 Type 2 diabetes mellitus without complications: Secondary | ICD-10-CM

## 2018-04-20 NOTE — Patient Instructions (Signed)
You can use Cetaphil moisturizer to your feet and legs daily. Do not apply in between your toes   Diabetes and Foot Care Diabetes may cause you to have problems because of poor blood supply (circulation) to your feet and legs. This may cause the skin on your feet to become thinner, break easier, and heal more slowly. Your skin may become dry, and the skin may peel and crack. You may also have nerve damage in your legs and feet causing decreased feeling in them. You may not notice minor injuries to your feet that could lead to infections or more serious problems. Taking care of your feet is one of the most important things you can do for yourself. Follow these instructions at home:  Wear shoes at all times, even in the house. Do not go barefoot. Bare feet are easily injured.  Check your feet daily for blisters, cuts, and redness. If you cannot see the bottom of your feet, use a mirror or ask someone for help.  Wash your feet with warm water (do not use hot water) and mild soap. Then pat your feet and the areas between your toes until they are completely dry. Do not soak your feet as this can dry your skin.  Apply a moisturizing lotion or petroleum jelly (that does not contain alcohol and is unscented) to the skin on your feet and to dry, brittle toenails. Do not apply lotion between your toes.  Trim your toenails straight across. Do not dig under them or around the cuticle. File the edges of your nails with an emery board or nail file.  Do not cut corns or calluses or try to remove them with medicine.  Wear clean socks or stockings every day. Make sure they are not too tight. Do not wear knee-high stockings since they may decrease blood flow to your legs.  Wear shoes that fit properly and have enough cushioning. To break in new shoes, wear them for just a few hours a day. This prevents you from injuring your feet. Always look in your shoes before you put them on to be sure there are no objects  inside.  Do not cross your legs. This may decrease the blood flow to your feet.  If you find a minor scrape, cut, or break in the skin on your feet, keep it and the skin around it clean and dry. These areas may be cleansed with mild soap and water. Do not cleanse the area with peroxide, alcohol, or iodine.  When you remove an adhesive bandage, be sure not to damage the skin around it.  If you have a wound, look at it several times a day to make sure it is healing.  Do not use heating pads or hot water bottles. They may burn your skin. If you have lost feeling in your feet or legs, you may not know it is happening until it is too late.  Make sure your health care provider performs a complete foot exam at least annually or more often if you have foot problems. Report any cuts, sores, or bruises to your health care provider immediately. Contact a health care provider if:  You have an injury that is not healing.  You have cuts or breaks in the skin.  You have an ingrown nail.  You notice redness on your legs or feet.  You feel burning or tingling in your legs or feet.  You have pain or cramps in your legs and feet.  Your  legs or feet are numb.  Your feet always feel cold. Get help right away if:  There is increasing redness, swelling, or pain in or around a wound.  There is a red line that goes up your leg.  Pus is coming from a wound.  You develop a fever or as directed by your health care provider.  You notice a bad smell coming from an ulcer or wound. This information is not intended to replace advice given to you by your health care provider. Make sure you discuss any questions you have with your health care provider. Document Released: 11/22/2000 Document Revised: 05/02/2016 Document Reviewed: 05/04/2013 Elsevier Interactive Patient Education  2017 [employer].

## 2018-04-20 NOTE — Progress Notes (Signed)
Subjective:    Patient ID: Jason Salazar, male    DOB: [birth year], 72 y.o.   MRN: [id number]  HPI  72 year old male presents the office today for diabetic foot evaluation as well as with numbness to the tops of his feet on both sides.  He states that he has some hip problems and arthritis in 7 thinks that some of the numbness to his feet can be coming from this.  He does use a topical cream for his hips and states he reports the medication on his hip it helps his feet.  He is diabetic and his last A1c was 6.2.  Denies any ulcerations or any open sores.  Review of Systems  All other systems reviewed and are negative.  Past Medical History:  Diagnosis Date  . Allergic rhinitis   . Balantidiasis   . Cancer [facility])    prostate cancer treated 2007 with radiation seed placement  . Diabetes ([place])   . H/O prostate cancer    Radioactive seeds  . Hypercholesteremia   . Hyperlipidemia   . Hyperlipoproteinemia   . Hypertension   . Sleep apnea     Past Surgical History:  Procedure Laterality Date  . cyst removed from right kidney       Current Outpatient Medications:  .  allopurinol (ZYLOPRIM) 100 MG tablet, Take 100 mg by mouth daily. , Disp: , Rfl:  .  aspirin EC 81 MG tablet, Take 81 mg by mouth daily., Disp: , Rfl:  .  cloNIDine (CATAPRES) 0.1 MG tablet, Take 0.1 mg by mouth daily., Disp: , Rfl:  .  colchicine 0.6 MG tablet, Take 0.6 mg by mouth 2 (two) times daily., Disp: , Rfl:  .  DILANTIN 100 MG ER capsule, Take 4 capsules (400 mg total) by mouth at bedtime., Disp: 360 capsule, Rfl: 3 .  ezetimibe (ZETIA) 10 MG tablet, Take 10 mg by mouth daily., Disp: , Rfl:  .  fluticasone (FLONASE) 50 MCG/ACT nasal spray, Place 1 spray into both nostrils daily. , Disp: , Rfl:  .  lisinopril-hydrochlorothiazide (PRINZIDE,ZESTORETIC) 20-12.5 MG tablet, Take 1 tablet by mouth daily., Disp: 7 tablet, Rfl: 0 .  loratadine (CLARITIN) 10 MG tablet, TAKE 1 TABLET EVERY DAY AS NEEDED, Disp: , Rfl:  5 .  sitaGLIPtin-metformin (JANUMET) 50-500 MG tablet, Take 1 tablet by mouth 2 (two) times daily with a meal., Disp: , Rfl:  .  TRAVATAN Z 0.004 % SOLN ophthalmic solution, Place 1 drop into both eyes at bedtime. , Disp: , Rfl:   No Known Allergies  Social History   Socioeconomic History  . Marital status: Married    Spouse name: [name]  . Number of children: 1  . Years of education: HS  . Highest education level: Not on file  Occupational History    Employer: [employer]  . Financial resource strain: Not on file  . Food insecurity:    Worry: Not on file    Inability: Not on file  . Transportation needs:    Medical: Not on file    Non-medical: Not on file  Tobacco Use  . Smoking status: Never Smoker  . Smokeless tobacco: Never Used  Substance and Sexual Activity  . Alcohol use: No  . Drug use: No  . Sexual activity: Not on file  Lifestyle  . Physical activity:    Days per week: Not on file    Minutes per session: Not on file  . Stress: Not on file  Relationships  . Social connections:    Talks on phone: Not on file    Gets together: Not on file    Attends religious service: Not on file    Active member of club or organization: Not on file    Attends meetings of clubs or organizations: Not on file    Relationship status: Not on file  . Intimate partner violence:    Fear of current or ex partner: Not on file    Emotionally abused: Not on file    Physically abused: Not on file    Forced sexual activity: Not on file  Other Topics Concern  . Not on file  Social History Narrative   Patient is married to [place] and has one son.   Caffeine Use: none; quit a few months ago                  Objective:   Physical Exam General: AAO x3, NAD  Dermatological: Skin is warm, dry and supple bilateral. Nails x 10 are well manicured; remaining integument appears unremarkable at this time. There are no open sores, no preulcerative lesions, no  rash or signs of infection present.  Vascular: Dorsalis Pedis artery and Posterior Tibial artery pedal pulses are 2/4 bilateral with immedate capillary fill time. There is no pain with calf compression, swelling, warmth, erythema.   Neruologic: Grossly intact via light touch bilateral. Vibratory intact via tuning fork bilateral. Protective threshold with Semmes Wienstein monofilament intact to all pedal sites bilateral.  Subjectively he gets some numbness to the tops of his feet but overall sensation is intact.  Musculoskeletal: No gross boney pedal deformities bilateral. No pain, crepitus, or limitation noted with foot and ankle range of motion bilateral. Muscular strength 5/5 in all groups tested bilateral.    Assessment & Plan:  72 year old male presents for diabetic foot exam, possible neuropathy -Treatment options discussed including all alternatives, risks, and complications -Etiology of symptoms were discussed -We discussed that nerve symptoms to his feet could be early neuropathy.  Although this is coming from his diabetes as it is been controlled.  We discussed various treatment options for this.  After discussion he wishes to observe the area.  If symptoms continue or worsen we can consider starting gabapentin.  Also discussed the use of anti-inflammatory cream to his feet as well.  -From a diabetes standpoint he is doing well no open sores continue daily foot inspection.   -Recommend one-year follow-up but encouraged to call any questions or concerns or any change in symptoms.  Trula Slade DPM

## 2018-10-27 ENCOUNTER — Other Ambulatory Visit: Payer: Self-pay | Admitting: Nurse Practitioner

## 2018-10-28 NOTE — Progress Notes (Signed)
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  Seizure disorder beginning at the age of 47 following a head injury.  No seizures in several years now.  He is currently on brand Dilantin without side effects.  No balance issues no falls no daytime drowsiness.  He has a history of obstructive sleep apnea and uses CPAP.  He is retired.  He returns for reevaluation he needs labs and refills    11/21/18CM Jason Salazar, 72year-old black male returns for  yearly followup. 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 No seizures in several years. He denies any missed doses of his medication. His seizure disorder began at age 63 following a head injury. He denies any side effects to his Dilantin, (BRAND). He denies any falls. He denies any daytime drowsiness. He also is diabetic but says his blood sugars are in good control. [name]  He gets no regular exercise. He has had some numbness in his feet no pain. He also has a history of high blood pressure and sleep disorder for which he uses CPAP at 13 cm of pressure. He is  Retired. He returns for reevaluation, labs and refills.    REVIEW OF SYSTEMS: Full 14 system review of systems performed and notable only for those listed, all others are neg:  Constitutional: neg  Cardiovascular: neg Ear/Nose/Throat: neg  Skin: neg Eyes: neg Respiratory: neg Gastroitestinal: neg  Hematology/Lymphatic: neg  Endocrine: Diabetic for 10 years Musculoskeletal:neg Allergy/Immunology: neg Neurological:  history of seizure disorder  Psychiatric:  neg Sleep : Obstructive sleep apnea with CPAP ALLERGIES: No Known Allergies  HOME MEDICATIONS: Outpatient Medications Prior to Visit  Medication Sig Dispense Refill  . allopurinol (ZYLOPRIM) 100 MG tablet Take 100 mg by mouth daily.     [name] aspirin EC 81 MG tablet Take 81 mg by mouth daily.    . cloNIDine (CATAPRES) 0.1 MG tablet Take 0.1 mg by mouth daily.    . colchicine 0.6 MG tablet Take 0.6 mg by mouth 2 (two) times daily.    [name] DILANTIN 100 MG ER capsule TAKE 4 CAPSULES (400 MG TOTAL) BY MOUTH AT BEDTIME. 360 capsule 3  . ezetimibe (ZETIA) 10 MG tablet Take 10 mg by mouth daily.    . fluticasone (FLONASE) 50 MCG/ACT nasal spray Place 1 spray into both nostrils daily.     [name] lisinopril-hydrochlorothiazide (PRINZIDE,ZESTORETIC) 20-12.5 MG tablet Take 1 tablet by mouth daily. 7 tablet 0  . loratadine (CLARITIN) 10 MG tablet TAKE 1 TABLET EVERY DAY AS NEEDED  5  . sitaGLIPtin-metformin (JANUMET) 50-500 MG tablet Take 1 tablet by mouth 2 (two) times daily with a meal.    . TRAVATAN Z 0.004 % SOLN ophthalmic solution Place 1 drop into both eyes at bedtime.      No facility-administered medications prior to visit.     PAST MEDICAL HISTORY: Past Medical History:  Diagnosis Date  . Allergic rhinitis   . Balantidiasis   . Cancer [facility])    prostate cancer treated 2007 with radiation seed placement  . Diabetes ([place])   . H/O prostate cancer    Radioactive seeds  . Hypercholesteremia   .  Hyperlipidemia   . Hyperlipoproteinemia   . Hypertension   . Sleep apnea     PAST SURGICAL HISTORY: Past Surgical History:  Procedure Laterality Date  . cyst removed from right kidney      FAMILY HISTORY: Family History  Problem Relation Age of Onset  . CVA Mother   . Sleep apnea Brother   . Thyroid cancer Father   . Prostate cancer Father     SOCIAL HISTORY: Social History   Socioeconomic History  . Marital status: Married    Spouse name: Jason Salazar  . Number of children: 1  . Years  of education: HS  . Highest education level: Not on file  Occupational History    Employer: [employer]  . Financial resource strain: Not on file  . Food insecurity:    Worry: Not on file    Inability: Not on file  . Transportation needs:    Medical: Not on file    Non-medical: Not on file  Tobacco Use  . Smoking status: Never Smoker  . Smokeless tobacco: Never Used  Substance and Sexual Activity  . Alcohol use: No  . Drug use: No  . Sexual activity: Not on file  Lifestyle  . Physical activity:    Days per week: Not on file    Minutes per session: Not on file  . Stress: Not on file  Relationships  . Social connections:    Talks on phone: Not on file    Gets together: Not on file    Attends religious service: Not on file    Active member of club or organization: Not on file    Attends meetings of clubs or organizations: Not on file    Relationship status: Not on file  . Intimate partner violence:    Fear of current or ex partner: Not on file    Emotionally abused: Not on file    Physically abused: Not on file    Forced sexual activity: Not on file  Other Topics Concern  . Not on file  Social History Narrative   Patient is married to Jason Salazar and has one son.   Caffeine Use: none; quit a few months ago               PHYSICAL EXAM  Vitals:   10/29/18 1010  BP: 116/63  Pulse: 98  Weight: 194 lb 12.8 oz (88.4 kg)  Height: 5\' 4"  (1.626 m)   Body mass index is 33.44 kg/m. Generalized: Well developed, obese male in no acute distress  Neurological examination  Mentation: Alert oriented to time, place, history taking. Follows all commands speech and language fluent  Cranial nerve II-XII: Pupils were equal round reactive to light extraocular movements were full, visual field were full on confrontational test. Facial sensation and strength were normal. hearing was intact to finger rubbing bilaterally. Uvula tongue midline. head turning and  shoulder shrug and were normal and symmetric.Tongue protrusion into cheek strength was normal.  Motor: normal bulk and tone, full strength in the BUE, BLE, Sensory intact to touch, Coordination: finger-nose-finger, heel-to-shin bilaterally, no dysmetria  Reflexes: Symmetric upper and lower, plantar responses were flexor bilaterally.  Gait and Station: Rising up from seated position without assistance, normal stance, moderate stride, good arm swing, smooth turning, able to perform tiptoe, and heel walking without difficulty. Tandem gait steady  DIAGNOSTIC DATA (LABS, IMAGING, TESTING)     ASSESSMENT AND PLAN  72 y.o. year old male  has a past  medical history of Hypertension; Hyperlipidemia;  Sleep apnea; H/O prostate cancer; Diabetes ([place]);  Hyperlipoproteinemia; and Hypercholesteremia. here to follow-up for his seizure disorder. No seizures in several years.   PLAN: Will check labs, CBC, CMP to monitor adverse effects of Dilantin  Dilantin level to monitor for therapeutic level/toxicity Call for seizure activity Continue Dilantin at current dose will refill BRAND Follow up 1 year Dennie Bible, [facility], [facility], APRN  [facility] [address] [place], [place] [address] [phone]

## 2018-10-29 ENCOUNTER — Encounter: Payer: Self-pay | Admitting: Nurse Practitioner

## 2018-10-29 ENCOUNTER — Ambulatory Visit: Payer: Medicare HMO | Admitting: Nurse Practitioner

## 2018-10-29 VITALS — BP 116/63 | HR 98 | Ht 64.0 in | Wt 194.8 lb

## 2018-10-29 DIAGNOSIS — G40209 Localization-related (focal) (partial) symptomatic epilepsy and epileptic syndromes with complex partial seizures, not intractable, without status epilepticus: Secondary | ICD-10-CM | POA: Diagnosis not present

## 2018-10-29 DIAGNOSIS — Z5181 Encounter for therapeutic drug level monitoring: Secondary | ICD-10-CM

## 2018-10-29 DIAGNOSIS — G40309 Generalized idiopathic epilepsy and epileptic syndromes, not intractable, without status epilepticus: Secondary | ICD-10-CM | POA: Diagnosis not present

## 2018-10-29 MED ORDER — DILANTIN 100 MG PO CAPS: 400.0000 mg | ORAL_CAPSULE | Freq: Every day | ORAL | 3 refills | Status: DC

## 2018-10-29 NOTE — Patient Instructions (Signed)
Will check labs, CBC, CMP to monitor adverse effects of Dilantin  Dilantin level to monitor for therapeutic level/toxicity Call for seizure activity Continue Dilantin at current dose will refill BRAND Follow up 1 year

## 2018-10-30 ENCOUNTER — Telehealth: Payer: Self-pay | Admitting: *Deleted

## 2018-10-30 LAB — COMPREHENSIVE METABOLIC PANEL
ALK PHOS: 129 IU/L — AB (ref 39–117)
ALT: 31 IU/L (ref 0–44)
AST: 17 IU/L (ref 0–40)
Albumin/Globulin Ratio: 2 (ref 1.2–2.2)
Albumin: 4.3 g/dL (ref 3.5–4.8)
BUN/Creatinine Ratio: 18 (ref 10–24)
BUN: 28 mg/dL — AB (ref 8–27)
Bilirubin Total: <0.2 mg/dL (ref 0.0–1.2)
CHLORIDE: 101 mmol/L (ref 96–106)
CO2: 20 mmol/L (ref 20–29)
Calcium: 9.2 mg/dL (ref 8.6–10.2)
Creatinine, Ser: 1.53 mg/dL — ABNORMAL HIGH (ref 0.76–1.27)
GFR calc non Af Amer: 45 mL/min/{1.73_m2} — ABNORMAL LOW (ref >59)
GFR, EST AFRICAN AMERICAN: 52 mL/min/{1.73_m2} — AB (ref >59)
GLUCOSE: 204 mg/dL — AB (ref 65–99)
Globulin, Total: 2.1 g/dL (ref 1.5–4.5)
Potassium: 4.7 mmol/L (ref 3.5–5.2)
Sodium: 137 mmol/L (ref 134–144)
Total Protein: 6.4 g/dL (ref 6.0–8.5)

## 2018-10-30 LAB — CBC WITH DIFFERENTIAL/PLATELET
Basophils Absolute: 0 10*3/uL (ref 0.0–0.2)
Basos: 0 %
EOS (ABSOLUTE): 0.1 10*3/uL (ref 0.0–0.4)
Eos: 1 %
HEMOGLOBIN: 12.8 g/dL — AB (ref 13.0–17.7)
Hematocrit: 39.4 % (ref 37.5–51.0)
IMMATURE GRANS (ABS): 0 10*3/uL (ref 0.0–0.1)
IMMATURE GRANULOCYTES: 0 %
LYMPHS: 23 %
Lymphocytes Absolute: 1.2 10*3/uL (ref 0.7–3.1)
MCH: 28.3 pg (ref 26.6–33.0)
MCHC: 32.5 g/dL (ref 31.5–35.7)
MCV: 87 fL (ref 79–97)
MONOCYTES: 10 %
Monocytes Absolute: 0.5 10*3/uL (ref 0.1–0.9)
NEUTROS PCT: 66 %
Neutrophils Absolute: 3.4 10*3/uL (ref 1.4–7.0)
Platelets: 157 10*3/uL (ref 150–450)
RBC: 4.52 x10E6/uL (ref 4.14–5.80)
RDW: 13.5 % (ref 12.3–15.4)
WBC: 5.2 10*3/uL (ref 3.4–10.8)

## 2018-10-30 LAB — PHENYTOIN LEVEL, TOTAL: PHENYTOIN (DILANTIN), SERUM: 15.7 ug/mL (ref 10.0–20.0)

## 2018-10-30 NOTE — Telephone Encounter (Signed)
Spoke with patient and informed his labs are okay except his creatinine is elevated at 1.53. Advised there is a good level of Dilantin. He stated his PCP is aware of kidney labs and is watching his kidney function. He verbalized understanding, appreciation of call.

## 2018-12-24 NOTE — Progress Notes (Signed)
I reviewed note and agree with plan.   Penni Bombard, MD

## 2019-01-12 DIAGNOSIS — N529 Male erectile dysfunction, unspecified: Secondary | ICD-10-CM | POA: Insufficient documentation

## 2019-04-19 ENCOUNTER — Other Ambulatory Visit: Payer: Self-pay

## 2019-04-19 ENCOUNTER — Ambulatory Visit: Payer: Medicare HMO | Admitting: Podiatry

## 2019-04-19 ENCOUNTER — Encounter: Payer: Self-pay | Admitting: Podiatry

## 2019-04-19 VITALS — Temp 97.3°F

## 2019-04-19 DIAGNOSIS — R600 Localized edema: Secondary | ICD-10-CM | POA: Diagnosis not present

## 2019-04-19 DIAGNOSIS — M792 Neuralgia and neuritis, unspecified: Secondary | ICD-10-CM

## 2019-04-19 DIAGNOSIS — E119 Type 2 diabetes mellitus without complications: Secondary | ICD-10-CM | POA: Diagnosis not present

## 2019-04-19 DIAGNOSIS — G40909 Epilepsy, unspecified, not intractable, without status epilepticus: Secondary | ICD-10-CM | POA: Insufficient documentation

## 2019-04-19 NOTE — Progress Notes (Signed)
Subjective:   Patient ID: Jason Salazar, male   DOB: 73 y.o.   MRN: [id number]   HPI Patient presents stating was here for a diabetic check and also had a small area of abrasion on the top of the right foot that he is concerned about.  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  Do not recommend any other treatments and continue along the same course as he has been with daily foot inspections

## 2019-07-15 ENCOUNTER — Ambulatory Visit (HOSPITAL_COMMUNITY)
Admission: EM | Admit: 2019-07-15 | Discharge: 2019-07-15 | Disposition: A | Discharge status: Home / Self Care | Payer: Medicare HMO | Attending: Physician Assistant | Admitting: Physician Assistant

## 2019-07-15 ENCOUNTER — Encounter (HOSPITAL_COMMUNITY): Payer: Self-pay | Admitting: Emergency Medicine

## 2019-07-15 ENCOUNTER — Other Ambulatory Visit: Payer: Self-pay

## 2019-07-15 DIAGNOSIS — M109 Gout, unspecified: Secondary | ICD-10-CM

## 2019-07-15 MED ORDER — METHYLPREDNISOLONE SODIUM SUCC 125 MG IJ SOLR
INTRAMUSCULAR | Status: AC
  Filled 2019-07-15: qty 2

## 2019-07-15 MED ORDER — METHYLPREDNISOLONE SODIUM SUCC 125 MG IJ SOLR
125.0000 mg | Freq: Once | INTRAMUSCULAR | Status: AC
  Administered 2019-07-15: 125 mg via INTRAMUSCULAR

## 2019-07-15 MED ORDER — KETOROLAC TROMETHAMINE 60 MG/2ML IM SOLN
INTRAMUSCULAR | Status: AC
  Filled 2019-07-15: qty 2

## 2019-07-15 MED ORDER — KETOROLAC TROMETHAMINE 60 MG/2ML IM SOLN
60.0000 mg | Freq: Once | INTRAMUSCULAR | Status: AC
  Administered 2019-07-15: 60 mg via INTRAMUSCULAR

## 2019-07-15 NOTE — ED Provider Notes (Signed)
[place]    CSN: [id number] Arrival date & time: 07/15/19  0240      History   Chief Complaint Chief Complaint  Patient presents with  . Foot Pain    HPI Jason Salazar is a 73 y.o. male.   The history is provided by the patient. No language interpreter was used.  Foot Pain This is a recurrent problem. The current episode started yesterday. The problem occurs constantly. The problem has been gradually worsening. Nothing aggravates the symptoms. Nothing relieves the symptoms. He has tried nothing for the symptoms. The treatment provided no relief.  Pt is on allop[urinaol and prednisone.  Today is his last dose of prednisone   Past Medical History:  Diagnosis Date  . Allergic rhinitis   . Balantidiasis   . Cancer [facility])    prostate cancer treated 2007 with radiation seed placement  . Diabetes ([place])   . H/O prostate cancer    Radioactive seeds  . Hypercholesteremia   . Hyperlipidemia   . Hyperlipoproteinemia   . Hypertension   . Sleep apnea     Patient Active Problem List   Diagnosis Date Noted  . Epilepsy ([place]) 04/19/2019  . 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 Partial epilepsy with impairment of consciousness ([place]) 10/04/2013  . Encounter for therapeutic drug monitoring 10/04/2013  . Hypertension   . Cancer ([place])   . Allergic rhinitis   . H/O prostate cancer   . Diabetes ([place])   . Balantidiasis   . Hypercholesteremia   . Obstructive sleep apnea 05/29/2013    Past Surgical History:  Procedure Laterality Date  . cyst removed from right kidney         Home Medications    Prior to Admission medications   Medication Sig Start Date End Date Taking? Authorizing Provider  allopurinol (ZYLOPRIM) 100 MG tablet Take 100 mg by mouth daily.  09/12/13   [provider]  aspirin EC 81 MG tablet Take 81 mg by mouth daily.    [provider]  cloNIDine (CATAPRES)  0.1 MG tablet Take 0.1 mg by mouth daily.    [provider]  colchicine 0.6 MG tablet Take 0.6 mg by mouth 2 (two) times daily.    [provider]  diclofenac sodium (VOLTAREN) 1 % GEL Apply 4 g topically 4 (four) times daily.    [provider]  DILANTIN 100 MG ER capsule Take 4 capsules (400 mg total) by mouth at bedtime. 10/29/18   Dennie Bible, NP  ezetimibe (ZETIA) 10 MG tablet Take 10 mg by mouth daily.    [provider]  fluticasone (FLONASE) 50 MCG/ACT nasal spray Place 1 spray into both nostrils daily.  07/05/13   [provider]  lisinopril-hydrochlorothiazide (PRINZIDE,ZESTORETIC) 20-12.5 MG tablet Take 1 tablet by mouth daily. 11/07/15   Belva Crome, MD  loratadine (CLARITIN) 10 MG tablet TAKE 1 TABLET EVERY DAY AS NEEDED 09/13/15   [provider]  rosuvastatin (CRESTOR) 10 MG tablet Take 10 mg by mouth daily. 04/09/19   [provider]  sildenafil (VIAGRA) 100 MG tablet Take by mouth. 01/12/19   [provider]  sitaGLIPtin-metformin (JANUMET) 50-500 MG tablet Take 1 tablet by mouth 2 (two) times daily with a meal.    [provider]  TRAVATAN Z 0.004 % SOLN ophthalmic solution Place 1 drop into both eyes at bedtime.  08/26/13   [provider]  Family History Family History  Problem Relation Age of Onset  . CVA Mother   . Sleep apnea Brother   . Thyroid cancer Father   . Prostate cancer Father     Social History Social History   Tobacco Use  . Smoking status: Never Smoker  . Smokeless tobacco: Never Used  Substance Use Topics  . Alcohol use: No  . Drug use: No     Allergies   Patient has no known allergies.   Review of Systems Review of Systems  Musculoskeletal: Positive for joint swelling and myalgias.  All other systems reviewed and are negative.    Physical Exam Triage Vital Signs ED Triage Vitals  Enc Vitals Group     BP 07/15/19 0952 118/73      Pulse Rate 07/15/19 0952 86     Resp 07/15/19 0952 18     Temp 07/15/19 0952 98.7 F (37.1 C)     Temp src --      SpO2 07/15/19 0952 98 %     Weight --      Height --      Head Circumference --      Peak Flow --      Pain Score 07/15/19 0953 9     Pain Loc --      Pain Edu? --      Excl. in [place]? --    No data found.  Updated Vital Signs BP 118/73   Pulse 86   Temp 98.7 F (37.1 C)   Resp 18   SpO2 98%   Visual Acuity Right Eye Distance:   Left Eye Distance:   Bilateral Distance:    Right Eye Near:   Left Eye Near:    Bilateral Near:     Physical Exam Vitals signs reviewed.  Musculoskeletal:        General: Swelling and tenderness present.     Comments: Swollen red foot tender to palpation  nv and ns intact  Skin:    General: Skin is warm.  Neurological:     General: No focal deficit present.     Mental Status: He is alert.  Psychiatric:        Mood and Affect: Mood normal.      UC Treatments / Results  Labs (all labs ordered are listed, but only abnormal results are displayed) Labs Reviewed - No data to display  EKG   Radiology No results found.  Procedures Procedures (including critical care time)  Medications Ordered in UC Medications  ketorolac (TORADOL) injection 60 mg (60 mg Intramuscular Given 07/15/19 1101)  methylPREDNISolone sodium succinate (SOLU-MEDROL) 125 mg/2 mL injection 125 mg (125 mg Intramuscular Given 07/15/19 1101)  methylPREDNISolone sodium succinate (SOLU-MEDROL) 125 mg/2 mL injection (has no administration in time range)  ketorolac (TORADOL) 60 MG/2ML injection (has no administration in time range)    Initial Impression / Assessment and Plan / UC Course  I have reviewed the triage vital signs and the nursing notes.  Pertinent labs & imaging results that were available during my care of the patient were reviewed by me and considered in my medical decision making (see chart for details).     MDM   Pt given injection of  solumedrol and torodol.  Pt advised to continue his medications and follow up with his MD Final Clinical Impressions(s) / UC Diagnoses   Final diagnoses:  Acute gout of right foot, unspecified cause     Discharge Instructions     See  your Physician for recheck.  Continue allopurinol    ED Prescriptions    None     Controlled Substance Prescriptions [place] Controlled Substance Registry consulted? Not Applicable   Fransico Meadow, [place] 07/15/19 1110

## 2019-07-15 NOTE — Discharge Instructions (Addendum)
See your Physician for recheck.  Continue allopurinol

## 2019-07-15 NOTE — ED Triage Notes (Signed)
Pt states a week ago he had swelling in his R foot and he called his doctor who gave him prednisone but it did not help. Pt concerned about gout.

## 2019-08-03 ENCOUNTER — Encounter: Payer: Self-pay | Admitting: Interventional Cardiology

## 2019-08-03 NOTE — Telephone Encounter (Signed)
error 

## 2019-10-17 NOTE — Progress Notes (Signed)
Cardiology Office Note:    Date:  10/18/2019   ID:  Jason Salazar, DOB August 12, 1946, MRN [id number]  PCP:  Nolene Ebbs, MD  Cardiologist:  Sinclair Grooms, MD   Referring MD: Nolene Ebbs, MD   Chief Complaint  Patient presents with  . Hypertension  . Hyperlipidemia  . Advice Only    DM II    History of Present Illness:    Jason Salazar is a 73 y.o. male with a hx of essential hypertension, hypercholesterolemia, DM II, and OSA, Referred for cardiology f/u by Dr. Jeanie Cooks.  Doing well.  No cardiopulmonary complaints.  Has a multitude of cardiovascular risk factors.  Risk factors include diabetes mellitus type 2, hypertension, obstructive sleep apnea, hyperlipidemia, male sex, CKD stage III, and physical inactivity.  He is compliant with CPAP.  He is compliant with all of his medication regimen.  Past Medical History:  Diagnosis Date  . Allergic rhinitis   . Balantidiasis   . Cancer [facility])    prostate cancer treated 2007 with radiation seed placement  . Diabetes ([employer])   . H/O prostate cancer    Radioactive seeds  . Hypercholesteremia   . Hyperlipidemia   . Hyperlipoproteinemia   . Hypertension   . Sleep apnea     Past Surgical History:  Procedure Laterality Date  . cyst removed from right kidney      Current Medications: Current Meds  Medication Sig  . allopurinol (ZYLOPRIM) 100 MG tablet Take 100 mg by mouth daily.   [name] aspirin EC 81 MG tablet Take 81 mg by mouth daily.  [name] b complex vitamins tablet Take 1 tablet by mouth daily.  . brinzolamide (AZOPT) 1 % ophthalmic suspension Azopt 1 % eye drops,suspension  . cloNIDine (CATAPRES) 0.1 MG tablet Take 0.1 mg by mouth daily.  . colchicine 0.6 MG tablet Take 0.6 mg by mouth 2 (two) times daily.  . diclofenac sodium (VOLTAREN) 1 % GEL Apply 2 g topically 4 (four) times daily.  [name] DILANTIN 100 MG ER capsule Take 4 capsules (400 mg total) by mouth at bedtime.  . fluticasone (FLONASE) 50 MCG/ACT nasal spray  Place 1 spray into both nostrils daily.   [name] lisinopril-hydrochlorothiazide (PRINZIDE,ZESTORETIC) 20-12.5 MG tablet Take 1 tablet by mouth daily.  [name] loratadine (CLARITIN) 10 MG tablet TAKE 1 TABLET EVERY DAY AS NEEDED  . Multiple Vitamins-Minerals (ONE-A-DAY MENS 50+ ADVANTAGE PO) Take 1 tablet by mouth daily.  . rosuvastatin (CRESTOR) 10 MG tablet Take 10 mg by mouth daily.  . sitaGLIPtin-metformin (JANUMET) 50-500 MG tablet Take 1 tablet by mouth 2 (two) times daily with a meal.  . TRAVATAN Z 0.004 % SOLN ophthalmic solution Place 1 drop into both eyes at bedtime.      Allergies:   Patient has no known allergies.   Social History   Socioeconomic History  . Marital status: Married    Spouse name: Jason Salazar  . Number of children: 1  . Years of education: HS  . Highest education level: Not on file  Occupational History    Employer: [employer]  . Financial resource strain: Not on file  . Food insecurity    Worry: Not on file    Inability: Not on file  . Transportation needs    Medical: Not on file    Non-medical: Not on file  Tobacco Use  . Smoking status: Never Smoker  . Smokeless tobacco: Never Used  Substance and Sexual Activity  . Alcohol use: No  .  Drug use: No  . Sexual activity: Not on file  Lifestyle  . Physical activity    Days per week: Not on file    Minutes per session: Not on file  . Stress: Not on file  Relationships  . Social Herbalist on phone: Not on file    Gets together: Not on file    Attends religious service: Not on file    Active member of club or organization: Not on file    Attends meetings of clubs or organizations: Not on file    Relationship status: Not on file  Other Topics Concern  . Not on file  Social History Narrative   Patient is married to Jason Salazar and has one son.   Caffeine Use: none; quit a few months ago               Family History: The patient's family history includes CVA in his mother;  Prostate cancer in his father; Sleep apnea in his brother; Thyroid cancer in his father.  ROS:   Please see the history of present illness.    He is physically and active.  He has gained weight.  All other systems reviewed and are negative.  EKGs/Labs/Other Studies Reviewed:    The following studies were reviewed today: No cardiac imaging data  EKG:  EKG normal sinus rhythm, biatrial abnormality, secondary T wave changes 1, aVL, V4 through V6.  First-degree AV block at 200 ms.  No change compared to EKG from 5 years ago.  Recent Labs: 10/29/2018: ALT 31; BUN 28; Creatinine, Ser 1.53; Hemoglobin 12.8; Platelets 157; Potassium 4.7; Sodium 137  Recent Lipid Panel No results found for: CHOL, TRIG, HDL, CHOLHDL, VLDL, LDLCALC, LDLDIRECT  Physical Exam:    VS:  BP 132/70   Pulse 86   Ht 5\' 3"  (1.6 m)   Wt 193 lb 3.2 oz (87.6 kg)   SpO2 97%   BMI 34.22 kg/m     Wt Readings from Last 3 Encounters:  10/18/19 193 lb 3.2 oz (87.6 kg)  10/29/18 194 lb 12.8 oz (88.4 kg)  10/29/17 195 lb 6.4 oz (88.6 kg)     GEN: Abdominal obesity with BMI of 34.  No acute distress HEENT: Normal NECK: No JVD. LYMPHATICS: No lymphadenopathy CARDIAC:  RRR without murmur, gallop, or edema. VASCULAR:  Normal Pulses. No bruits. RESPIRATORY:  Clear to auscultation without rales, wheezing or rhonchi  ABDOMEN: Soft, non-tender, non-distended, No pulsatile mass, MUSCULOSKELETAL: No deformity  SKIN: Warm and dry NEUROLOGIC:  Alert and oriented x 3 PSYCHIATRIC:  Normal affect   ASSESSMENT:    1. Essential hypertension   2. Hypercholesteremia   3. Obstructive sleep apnea   4. H/O prostate cancer   5. Type 2 diabetes mellitus with other specified complication, without long-term current use of insulin ([place])   6. Educated about COVID-19 virus infection    PLAN:    In order of problems listed above:  1. Excellent blood pressure control.  Advocated target 130/80 mmHg or less. 2. LDL target should be  less than 70.  Most recent was 94.  Recommend intensifying statin therapy to at least 20 mg/day and perhaps 40.  So advocated decrease carbohydrate in diet, decrease animal fats in diet, and physical activity. 3. He is compliant with CPAP. 4. Not discussed 5. Hemoglobin A1c target should be less than 7.  Consider addition of an SGLT2 for control and cardioprotection. 6. The 65W' S are discussed in detail and  being endorsed by the patient in life.  Overall education and awareness concerning primary risk prevention was discussed in detail: LDL less than 70, hemoglobin A1c less than 7, blood pressure target less than 130/80 mmHg, >150 minutes of moderate aerobic activity per week, avoidance of smoking, weight control (via diet and exercise), and continued surveillance/management of/for obstructive sleep apnea.    Medication Adjustments/Labs and Tests Ordered: Current medicines are reviewed at length with the patient today.  Concerns regarding medicines are outlined above.  Orders Placed This Encounter  Procedures  . EKG 12-Lead   No orders of the defined types were placed in this encounter.   Patient Instructions  Medication Instructions:  Your physician recommends that you continue on your current medications as directed. Please refer to the Current Medication list given to you today.  *If you need a refill on your cardiac medications before your next appointment, please call your pharmacy*  Lab Work: None If you have labs (blood work) drawn today and your tests are completely normal, you will receive your results only by: [name] MyChart Message (if you have MyChart) OR . A paper copy in the mail If you have any lab test that is abnormal or we need to change your treatment, we will call you to review the results.  Testing/Procedures: None  Follow-Up: At [facility], you and your health needs are our priority.  As part of our continuing mission to provide you with exceptional heart  care, we have created designated Provider Care Teams.  These Care Teams include your primary Cardiologist (physician) and Advanced Practice Providers (APPs -  Physician Assistants and Nurse Practitioners) who all work together to provide you with the care you need, when you need it.  Your next appointment:   12 months  The format for your next appointment:   In Person  Provider:   You may see Sinclair Grooms, MD or one of the following Advanced Practice Providers on your designated Care Team:    Truitt Merle, NP  Cecilie Kicks, NP  Kathyrn Drown, NP   Other Instructions      Signed, Sinclair Grooms, MD  10/18/2019 9:42 AM    [place]

## 2019-10-18 ENCOUNTER — Other Ambulatory Visit: Payer: Self-pay

## 2019-10-18 ENCOUNTER — Encounter: Payer: Self-pay | Admitting: Interventional Cardiology

## 2019-10-18 ENCOUNTER — Ambulatory Visit: Payer: Medicare HMO | Admitting: Interventional Cardiology

## 2019-10-18 VITALS — BP 132/70 | HR 86 | Ht 63.0 in | Wt 193.2 lb

## 2019-10-18 DIAGNOSIS — E7800 Pure hypercholesterolemia, unspecified: Secondary | ICD-10-CM

## 2019-10-18 DIAGNOSIS — G4733 Obstructive sleep apnea (adult) (pediatric): Secondary | ICD-10-CM

## 2019-10-18 DIAGNOSIS — E1169 Type 2 diabetes mellitus with other specified complication: Secondary | ICD-10-CM

## 2019-10-18 DIAGNOSIS — Z8546 Personal history of malignant neoplasm of prostate: Secondary | ICD-10-CM | POA: Diagnosis not present

## 2019-10-18 DIAGNOSIS — I10 Essential (primary) hypertension: Secondary | ICD-10-CM | POA: Diagnosis not present

## 2019-10-18 DIAGNOSIS — Z7189 Other specified counseling: Secondary | ICD-10-CM

## 2019-10-18 NOTE — Patient Instructions (Signed)

## 2019-11-02 ENCOUNTER — Ambulatory Visit: Payer: Medicare HMO | Admitting: Diagnostic Neuroimaging

## 2019-11-02 ENCOUNTER — Other Ambulatory Visit: Payer: Self-pay

## 2019-11-02 ENCOUNTER — Encounter: Payer: Self-pay | Admitting: Diagnostic Neuroimaging

## 2019-11-02 VITALS — BP 124/68 | HR 87 | Temp 97.8°F | Ht 63.0 in | Wt 193.0 lb

## 2019-11-02 DIAGNOSIS — G40309 Generalized idiopathic epilepsy and epileptic syndromes, not intractable, without status epilepticus: Secondary | ICD-10-CM

## 2019-11-02 MED ORDER — DILANTIN 100 MG PO CAPS: 400.0000 mg | ORAL_CAPSULE | Freq: Every day | ORAL | 4 refills | Status: DC

## 2019-11-02 NOTE — Progress Notes (Signed)
[facility]  PATIENT: Jason Salazar DOB: 06-29-1946  REFERRING CLINICIAN:  HISTORY FROM: patient  REASON FOR VISIT: follow up    HISTORICAL  CHIEF COMPLAINT:  Chief Complaint  Patient presents with  . Epilepsy    rm 8 one YR  FU, "no seizure issues"    HISTORY OF PRESENT ILLNESS:   UPDATE (11/02/19, VRP): Since last visit, doing well. Symptoms are stable. No seizures. No alleviating or aggravating factors. Tolerating dilantin. Last seizure ~ 2010 or earlier.   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  Seizure disorder beginning at the age of 72 following a head injury.  No seizures in several years now.  He is currently on brand Dilantin without side effects.  No balance issues no falls no daytime drowsiness.  He has a history of obstructive sleep apnea and uses CPAP.  He is retired.  He returns for reevaluation he needs labs and refills  UPDATE (10/29/17, CM) Jason Salazar, 73year-old black male returns for  yearly followup. 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 No seizures in several years. He denies any missed doses of his medication. His seizure disorder began at age 68 following a head injury. He denies any side effects to his Dilantin, (BRAND). He denies any falls. He denies any daytime drowsiness. He also is diabetic but says his blood sugars are in good control. [name]  He gets no regular exercise. He has had some numbness in his feet no pain. He also has a history of high blood pressure and sleep disorder for which he uses CPAP at 13 cm of pressure. He is  Retired. He returns for reevaluation, labs and refills.   REVIEW OF SYSTEMS: Full 14 system review of systems performed and negative with exception of: as per HPI.   ALLERGIES: No  Known Allergies  HOME MEDICATIONS: Outpatient Medications Prior to Visit  Medication Sig Dispense Refill  . allopurinol (ZYLOPRIM) 100 MG tablet Take 100 mg by mouth daily.     [name] aspirin EC 81 MG tablet Take 81 mg by mouth daily.    [name] b complex vitamins tablet Take 1 tablet by mouth daily.    . brinzolamide (AZOPT) 1 % ophthalmic suspension Azopt 1 % eye drops,suspension    . colchicine 0.6 MG tablet Take 0.6 mg by mouth 2 (two) times daily.    . diclofenac sodium (VOLTAREN) 1 % GEL Apply 2 g topically 4 (four) times daily.    [name] DILANTIN 100 MG ER capsule Take 4 capsules (400 mg total) by mouth at bedtime. 360 capsule 3  . fluticasone (FLONASE) 50 MCG/ACT nasal spray Place 1 spray into both nostrils daily.     [name] lisinopril-hydrochlorothiazide (PRINZIDE,ZESTORETIC) 20-12.5 MG tablet Take 1 tablet by mouth daily. 7 tablet 0  . loratadine (CLARITIN) 10 MG tablet TAKE 1 TABLET EVERY DAY AS NEEDED  5  . Multiple Vitamins-Minerals (ONE-A-DAY MENS 50+ ADVANTAGE PO) Take 1 tablet by mouth daily.    . rosuvastatin (CRESTOR) 10 MG tablet Take 10 mg by mouth daily.    . sitaGLIPtin-metformin (JANUMET) 50-500 MG tablet Take 1 tablet by mouth 2 (two) times daily with a meal.    . TRAVATAN Z 0.004 % SOLN ophthalmic solution Place 1 drop into both eyes at bedtime.     . cloNIDine (  CATAPRES) 0.1 MG tablet Take 0.1 mg by mouth daily.     No facility-administered medications prior to visit.     PHYSICAL EXAM  GENERAL EXAM/CONSTITUTIONAL: Vitals:  Vitals:   11/02/19 0958  BP: 124/68  Pulse: 87  Temp: 97.8 F (36.6 C)  Weight: 193 lb (87.5 kg)  Height: 5\' 3"  (1.6 m)     Body mass index is 34.19 kg/m. Wt Readings from Last 3 Encounters:  11/02/19 193 lb (87.5 kg)  10/18/19 193 lb 3.2 oz (87.6 kg)  10/29/18 194 lb 12.8 oz (88.4 kg)     Patient is in no distress; well developed, nourished and groomed; neck is supple  CARDIOVASCULAR:  Examination of carotid arteries is normal; no carotid  bruits  Regular rate and rhythm, no murmurs  Examination of peripheral vascular system by observation and palpation is normal  EYES:  Ophthalmoscopic exam of optic discs and posterior segments is normal; no papilledema or hemorrhages  No exam data present  MUSCULOSKELETAL:  Gait, strength, tone, movements noted in Neurologic exam below  NEUROLOGIC: MENTAL STATUS:  No flowsheet data found.  awake, alert, oriented to person, place and time  recent and remote memory intact  normal attention and concentration  language fluent, comprehension intact, naming intact  fund of knowledge appropriate  CRANIAL NERVE:   2nd - no papilledema on fundoscopic exam  2nd, 3rd, 4th, 6th - pupils equal and reactive to light, visual fields full to confrontation, extraocular muscles intact, no nystagmus  5th - facial sensation symmetric  7th - facial strength symmetric  8th - hearing intact  9th - palate elevates symmetrically, uvula midline  11th - shoulder shrug symmetric  12th - tongue protrusion midline  MOTOR:   normal bulk and tone, full strength in the BUE, BLE  SENSORY:   normal and symmetric to light touch  COORDINATION:   finger-nose-finger, fine finger movements normal  REFLEXES:   deep tendon reflexes TRACE and symmetric  GAIT/STATION:   narrow based gait     DIAGNOSTIC DATA (LABS, IMAGING, TESTING) - I reviewed patient records, labs, notes, testing and imaging myself where available.  Lab Results  Component Value Date   WBC 5.2 10/29/2018   HGB 12.8 (L) 10/29/2018   HCT 39.4 10/29/2018   MCV 87 10/29/2018   PLT 157 10/29/2018      Component Value Date/Time   NA 137 10/29/2018 1044   K 4.7 10/29/2018 1044   CL 101 10/29/2018 1044   CO2 20 10/29/2018 1044   GLUCOSE 204 (H) 10/29/2018 1044   GLUCOSE 110 (H) 02/19/2014 0843   BUN 28 (H) 10/29/2018 1044   CREATININE 1.53 (H) 10/29/2018 1044   CALCIUM 9.2 10/29/2018 1044   PROT 6.4  10/29/2018 1044   ALBUMIN 4.3 10/29/2018 1044   AST 17 10/29/2018 1044   ALT 31 10/29/2018 1044   ALKPHOS 129 (H) 10/29/2018 1044   BILITOT <0.2 10/29/2018 1044   GFRNONAA 45 (L) 10/29/2018 1044   GFRAA 52 (L) 10/29/2018 1044   No results found for: CHOL, HDL, LDLCALC, LDLDIRECT, TRIG, CHOLHDL No results found for: HGBA1C No results found for: VITAMINB12 No results found for: TSH  Lab Results  Component Value Date   PHENYTOIN 15.7 10/29/2018    02/19/14 MRI brain 1. 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 2. Asymmetric right parietal and occipital white matter change. This is likely related to the remote ischemic events. Posterior reversible encephalopathy syndrome is also considered. 3. Focal atrophy is  evident in the left parietal lobe as well. 4. Asymmetric left-sided periventricular white matter changes and remote lacunar infarcts of the basal ganglia. 5. The overall picture is that of significant microvascular disease, advanced for age   ASSESSMENT AND PLAN  73 y.o. year old male here with seizure disorder. Stable.   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 DILANTIN 100 MG ER capsule    Sig: Take 4 capsules (400 mg total) by mouth at bedtime.    Dispense:  360 capsule    Refill:  4   Return in about 1 year (around 11/01/2020) for with NP (Amy Lomax).    Penni Bombard, MD [id number], [id number] AM Certified in Neurology, Neurophysiology and Neuroimaging  [facility] [address], [place], [place] [address] [phone]

## 2020-01-17 ENCOUNTER — Ambulatory Visit: Payer: Medicare Other

## 2020-01-22 ENCOUNTER — Ambulatory Visit: Payer: Medicare Other | Attending: Internal Medicine

## 2020-01-22 DIAGNOSIS — Z23 Encounter for immunization: Secondary | ICD-10-CM

## 2020-01-22 NOTE — Progress Notes (Signed)
   Covid-19 Vaccination Clinic  Name:  Jason Salazar    MRN: [id number] DOB: [birth year]  01/22/2020  [name] was observed post Covid-19 immunization for 15 minutes without incidence. He was provided with Vaccine Information Sheet and instruction to access the V-Safe system.   [name] was instructed to call 911 with any severe reactions post vaccine: [name] Difficulty breathing  . Swelling of your face and throat  . A fast heartbeat  . A bad rash all over your body  . Dizziness and weakness    Immunizations Administered    Name Date Dose VIS Date Route   Pfizer COVID-19 Vaccine 01/22/2020  9:22 AM 0.3 mL 11/19/2019 Intramuscular   Manufacturer: [place]   Lot: [id number]   [place]: [id number]

## 2020-02-13 ENCOUNTER — Ambulatory Visit: Payer: Medicare Other | Attending: Internal Medicine

## 2020-02-13 DIAGNOSIS — Z23 Encounter for immunization: Secondary | ICD-10-CM | POA: Insufficient documentation

## 2020-02-13 NOTE — Progress Notes (Deleted)
Cardiology Office Note:    Date:  02/13/2020   ID:  Jason Salazar, DOB [birth year], MRN [id number]  PCP:  Nolene Ebbs, MD  Cardiologist:  Sinclair Grooms, MD   Referring MD: Nolene Ebbs, MD   No chief complaint on file.   History of Present Illness:    Jason Salazar is a 74 y.o. male with a hx of essential hypertension, hypercholesterolemia, DM II, CKD 3, OSA, prostate cancer, and obesity.  ***  Past Medical History:  Diagnosis Date  . Allergic rhinitis   . Balantidiasis   . Cancer [facility])    prostate cancer treated 2007 with radiation seed placement  . Diabetes ([place])   . H/O prostate cancer    Radioactive seeds  . Hypercholesteremia   . Hyperlipidemia   . Hyperlipoproteinemia   . Hypertension   . Sleep apnea     Past Surgical History:  Procedure Laterality Date  . cyst removed from right kidney      Current Medications: No outpatient medications have been marked as taking for the 02/14/20 encounter (Appointment) with Belva Crome, MD.     Allergies:   Patient has no known allergies.   Social History   Socioeconomic History  . Marital status: Married    Spouse name: [name]  . Number of children: 1  . Years of education: HS  . Highest education level: Not on file  Occupational History    Employer: [employer]  Tobacco Use  . Smoking status: Never Smoker  . Smokeless tobacco: Never Used  Substance and Sexual Activity  . Alcohol use: No  . Drug use: No  . Sexual activity: Not on file  Other Topics Concern  . Not on file  Social History Narrative   Patient is married to [place] and has one son.   Caffeine Use: none; quit a few months ago             Social Determinants of Health   Financial Resource Strain:   . Difficulty of Paying Living Expenses: Not on file  Food Insecurity:   . Worried About Charity fundraiser in the Last Year: Not on file  . Ran Out of Food in the Last Year: Not on file  Transportation Needs:   .  Lack of Transportation (Medical): Not on file  . Lack of Transportation (Non-Medical): Not on file  Physical Activity:   . Days of Exercise per Week: Not on file  . Minutes of Exercise per Session: Not on file  Stress:   . Feeling of Stress : Not on file  Social Connections:   . Frequency of Communication with Friends and Family: Not on file  . Frequency of Social Gatherings with Friends and Family: Not on file  . Attends Religious Services: Not on file  . Active Member of Clubs or Organizations: Not on file  . Attends Archivist Meetings: Not on file  . Marital Status: Not on file     Family History: The patient's family history includes CVA in his mother; Prostate cancer in his father; Sleep apnea in his brother; Thyroid cancer in his father.  ROS:   Please see the history of present illness.    *** All other systems reviewed and are negative.  EKGs/Labs/Other Studies Reviewed:    The following studies were reviewed today: ***  EKG:  EKG ***  Recent Labs: No results found for requested labs within last 8760 hours.  Recent Lipid Panel No results found  for: CHOL, TRIG, HDL, CHOLHDL, VLDL, LDLCALC, LDLDIRECT  Physical Exam:    VS:  There were no vitals taken for this visit.    Wt Readings from Last 3 Encounters:  11/02/19 193 lb (87.5 kg)  10/18/19 193 lb 3.2 oz (87.6 kg)  10/29/18 194 lb 12.8 oz (88.4 kg)     GEN: ***. No acute distress HEENT: Normal NECK: No JVD. LYMPHATICS: No lymphadenopathy CARDIAC: *** RRR without murmur, gallop, or edema. VASCULAR: *** Normal Pulses. No bruits. RESPIRATORY:  Clear to auscultation without rales, wheezing or rhonchi  ABDOMEN: Soft, non-tender, non-distended, No pulsatile mass, MUSCULOSKELETAL: No deformity  SKIN: Warm and dry NEUROLOGIC:  Alert and oriented x 3 PSYCHIATRIC:  Normal affect   ASSESSMENT:    1. Essential hypertension   2. Hypercholesteremia   3. Type 2 diabetes mellitus with other specified  complication, without long-term current use of insulin ([place])   4. Obstructive sleep apnea   5. H/O prostate cancer   6. Stage 3b chronic kidney disease   7. Educated about COVID-19 virus infection    PLAN:    In order of problems listed above:  1. ***   Medication Adjustments/Labs and Tests Ordered: Current medicines are reviewed at length with the patient today.  Concerns regarding medicines are outlined above.  No orders of the defined types were placed in this encounter.  No orders of the defined types were placed in this encounter.   There are no Patient Instructions on file for this visit.   Signed, Sinclair Grooms, MD  02/13/2020 5:40 PM    [facility]

## 2020-02-13 NOTE — Progress Notes (Signed)
   Covid-19 Vaccination Clinic  Name:  Jason Salazar    MRN: [id number] DOB: 1946/09/03  02/13/2020  Jason Salazar was observed post Covid-19 immunization for 15 minutes without incident. He was provided with Vaccine Information Sheet and instruction to access the V-Safe system.   Jason Salazar was instructed to call 911 with any severe reactions post vaccine: [name] Difficulty breathing  . Swelling of face and throat  . A fast heartbeat  . A bad rash all over body  . Dizziness and weakness   Immunizations Administered    Name Date Dose VIS Date Route   Pfizer COVID-19 Vaccine 02/13/2020  2:20 PM 0.3 mL 11/19/2019 Intramuscular   Manufacturer: [place]   Lot: [id number]   [place]: [id number]

## 2020-02-14 ENCOUNTER — Ambulatory Visit: Payer: Medicare Other | Admitting: Interventional Cardiology

## 2020-02-17 ENCOUNTER — Telehealth: Payer: Self-pay | Admitting: Diagnostic Neuroimaging

## 2020-02-17 NOTE — Telephone Encounter (Signed)
Pt is calling in regards to a medication that was attempted delivery states he is unsure of the name of the medication but wanted to check in to discuss the medications he should be taking

## 2020-02-17 NOTE — Telephone Encounter (Signed)
Called patient who stated the pharmacy called him about a Rx for $8. He state he doesn't know who prescribed it, asked what Dr Leta Baptist prescribes for him. I informed him only Dilantin, of which he said he is aware. He stated he'll og back to pharmacy and ask who sent in new Rx; he doesn't want to pay for it if he didn't ask for it. Patient verbalized understanding, appreciation.

## 2020-02-23 ENCOUNTER — Telehealth: Payer: Self-pay | Admitting: Diagnostic Neuroimaging

## 2020-02-23 MED ORDER — DILANTIN 100 MG PO CAPS: 400.0000 mg | ORAL_CAPSULE | Freq: Every day | ORAL | 4 refills | Status: DC

## 2020-02-23 NOTE — Telephone Encounter (Signed)
Pt called needing a refill on his DILANTIN 100 MG ER capsule sent in to the [facility] on [address]

## 2020-02-23 NOTE — Telephone Encounter (Signed)
Dilantin refilled per last refill. He has FU in Nov 2021.

## 2020-08-14 ENCOUNTER — Encounter (HOSPITAL_COMMUNITY): Payer: Self-pay | Admitting: Emergency Medicine

## 2020-08-14 ENCOUNTER — Other Ambulatory Visit: Payer: Self-pay

## 2020-08-14 ENCOUNTER — Ambulatory Visit (HOSPITAL_COMMUNITY)
Admission: EM | Admit: 2020-08-14 | Discharge: 2020-08-14 | Disposition: A | Discharge status: Home / Self Care | Payer: Medicare Other

## 2020-08-14 DIAGNOSIS — R066 Hiccough: Secondary | ICD-10-CM

## 2020-08-14 MED ORDER — DEXILANT 60 MG PO CPDR: DELAYED_RELEASE_CAPSULE | ORAL | 0 refills | Status: DC

## 2020-08-14 NOTE — ED Provider Notes (Signed)
[facility]    CSN: [id number] Arrival date & time: 08/14/20  1547      History   Chief Complaint Chief Complaint  Patient presents with  . Cough    HPI Jason Salazar is a 74 y.o. male.   Over a week ago woke up with an episode of nausea and vomiting. Has been coughing up clear phlegm since this episode and having persistent hiccups every couple of minutes. Went to UC directly after event and was given dexilant, antibiotic, and prednisone which haven't helped much. Main concern now is ongoing hiccups. Denies CP, SOB, palpitations, wheezing, further N/V, fever, chills, sweats, missed seizure medication doses, palpitations. Still taking the dexilant, finished the other medications.      Past Medical History:  Diagnosis Date  . Allergic rhinitis   . Balantidiasis   . Cancer [facility])    prostate cancer treated 2007 with radiation seed placement  . Diabetes ([place])   . H/O prostate cancer    Radioactive seeds  . Hypercholesteremia   . Hyperlipidemia   . Hyperlipoproteinemia   . Hypertension   . Sleep apnea     Patient Active Problem List   Diagnosis Date Noted  . Epilepsy ([place]) 04/19/2019  . 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 Partial epilepsy with impairment of consciousness ([place]) 10/04/2013  . Encounter for therapeutic drug monitoring 10/04/2013  . Hypertension   . Cancer ([place])   . Allergic rhinitis   . H/O prostate cancer   . Diabetes ([place])   . Balantidiasis   . Hypercholesteremia   . Obstructive sleep apnea 05/29/2013    Past Surgical History:  Procedure Laterality Date  . cyst removed from right kidney         Home Medications    Prior to Admission medications   Medication Sig Start Date End Date Taking? Authorizing Provider  amoxicillin-clavulanate (AUGMENTIN) 875-125 MG tablet Augmentin 875 mg-125 mg tablet  Take 1 tablet every 12 hours by oral route for 10 days.    Yes [provider]  aspirin EC 81 MG tablet Take 81 mg by mouth daily.   Yes [provider]  brinzolamide (AZOPT) 1 % ophthalmic suspension Azopt 1 % eye drops,suspension   Yes [provider]  colchicine 0.6 MG tablet Take 0.6 mg by mouth 2 (two) times daily.   Yes [provider]  DILANTIN 100 MG ER capsule Take 4 capsules (400 mg total) by mouth at bedtime. 02/23/20  Yes Penumalli, Earlean Polka, MD  fluticasone (FLONASE) 50 MCG/ACT nasal spray Place 1 spray into both nostrils daily.  07/05/13  Yes [provider]  JANUMET XR 50-500 MG TB24 Take 1 tablet by mouth 2 (two) times daily. 08/08/20  Yes [provider]  lisinopril-hydrochlorothiazide (PRINZIDE,ZESTORETIC) 20-12.5 MG tablet Take 1 tablet by mouth daily. 11/07/15  Yes Belva Crome, MD  rosuvastatin (CRESTOR) 10 MG tablet Take 10 mg by mouth daily. 04/09/19  Yes [provider]  sitaGLIPtin-metformin (JANUMET) 50-500 MG tablet Take 1 tablet by mouth 2 (two) times daily with a meal.   Yes [provider]  TRAVATAN Z 0.004 % SOLN ophthalmic solution Place 1 drop into both eyes at bedtime.  08/26/13  Yes [provider]  allopurinol (ZYLOPRIM) 100 MG tablet Take 100 mg by mouth daily.  09/12/13   [provider]  b complex vitamins tablet Take 1 tablet by mouth daily.    [provider]  dexlansoprazole (DEXILANT) 60 MG capsule Dexilant 60 mg capsule, delayed release  Take 1 capsule every day by oral route for 56 days. 08/14/20   Volney American, PA-C  diclofenac sodium (VOLTAREN) 1 % GEL Apply 2 g topically 4 (four) times daily.    [provider]  loratadine (CLARITIN) 10 MG tablet TAKE 1 TABLET EVERY DAY AS NEEDED 09/13/15   [provider]  methylPREDNISolone (MEDROL DOSEPAK) 4 MG TBPK tablet Take by mouth. 08/08/20   [provider]  Multiple Vitamins-Minerals (ONE-A-DAY MENS 50+ ADVANTAGE PO) Take 1 tablet by mouth  daily.    [provider]    Family History Family History  Problem Relation Age of Onset  . CVA Mother   . Sleep apnea Brother   . Thyroid cancer Father   . Prostate cancer Father     Social History Social History   Tobacco Use  . Smoking status: Never Smoker  . Smokeless tobacco: Never Used  Substance Use Topics  . Alcohol use: No  . Drug use: No     Allergies   Patient has no known allergies.   Review of Systems Review of Systems   Physical Exam Triage Vital Signs ED Triage Vitals  Enc Vitals Group     BP 08/14/20 1610 (!) 134/57     Pulse Rate 08/14/20 1610 91     Resp 08/14/20 1610 19     Temp 08/14/20 1610 98.7 F (37.1 C)     Temp Source 08/14/20 1610 Oral     SpO2 08/14/20 1610 97 %     Weight --      Height --      Head Circumference --      Peak Flow --      Pain Score 08/14/20 1606 0     Pain Loc --      Pain Edu? --      Excl. in [place]? --    No data found.  Updated Vital Signs BP (!) 134/57 (BP Location: Right Arm)   Pulse 91   Temp 98.7 F (37.1 C) (Oral)   Resp 19   SpO2 97%   Visual Acuity Right Eye Distance:   Left Eye Distance:   Bilateral Distance:    Right Eye Near:   Left Eye Near:    Bilateral Near:     Physical Exam Vitals and nursing note reviewed.  Constitutional:      Appearance: Normal appearance.  HENT:     Head: Atraumatic.     Mouth/Throat:     Mouth: Mucous membranes are moist.     Pharynx: Oropharynx is clear. No oropharyngeal exudate or posterior oropharyngeal erythema.  Eyes:     Extraocular Movements: Extraocular movements intact.     Conjunctiva/sclera: Conjunctivae normal.  Cardiovascular:     Rate and Rhythm: Normal rate and regular rhythm.     Pulses: Normal pulses.  Pulmonary:     Effort: Pulmonary effort is normal.     Breath sounds: Normal breath sounds. No wheezing or rales.  Abdominal:     General: Bowel sounds are normal. There is no distension.     Palpations: Abdomen is  soft.     Tenderness: There is no abdominal tenderness.  Musculoskeletal:        General: Normal range of motion.     Cervical back: Normal range of motion and neck supple.  Skin:    General: Skin is warm and dry.  Neurological:  General: No focal deficit present.     Mental Status: He is oriented to person, place, and time.  Psychiatric:        Mood and Affect: Mood normal.        Thought Content: Thought content normal.        Judgment: Judgment normal.      UC Treatments / Results  Labs (all labs ordered are listed, but only abnormal results are displayed) Labs Reviewed - No data to display  EKG   Radiology No results found.  Procedures Procedures (including critical care time)  Medications Ordered in UC Medications - No data to display  Initial Impression / Assessment and Plan / UC Course  I have reviewed the triage vital signs and the nursing notes.  Pertinent labs & imaging results that were available during my care of the patient were reviewed by me and considered in my medical decision making (see chart for details).     Overall well appearing today, vital signs stable and in no distress. Suspect reflux event caused some nerve irritation that may be causing his persistent hiccups, but discussed importance of close PCP and specialist follow up for further investigation. His EKG showed new [place] 1 heart block which was not present in last EKG done 10/2019, he is agreeable to calling his Cardiologist first thing in the morning for further workup on this. Extensive ER precautions given, pt aware to go with any worsening sxs or concerns. Patient discharged in stable condition and told to continue dexilant and take his allergy medicines daily for his ongoing cough of clear sputum.   Final Clinical Impressions(s) / UC Diagnoses   Final diagnoses:  Intractable hiccups     Discharge Instructions     Take your claritin and flonase consistently which I think will  help with your cough, and continue taking the dexilant for now in case the hiccups are related to your reflux event. Call your Cardiologist first thing in the morning regarding your EKG results today, and do not hesitate going to the ER if your symptoms worsen at any time    ED Prescriptions    Medication Sig Dispense Auth. Provider   dexlansoprazole (DEXILANT) 60 MG capsule  (Status: Discontinued) Dexilant 60 mg capsule, delayed release  Take 1 capsule every day by oral route for 56 days. 30 capsule Volney American, [place]   dexlansoprazole (DEXILANT) 60 MG capsule Dexilant 60 mg capsule, delayed release  Take 1 capsule every day by oral route for 56 days. 30 capsule Volney American, [place]     PDMP not reviewed this encounter.   Volney American, [place] 08/14/20 1738

## 2020-08-14 NOTE — Discharge Instructions (Signed)
Take your claritin and flonase consistently which I think will help with your cough, and continue taking the dexilant for now in case the hiccups are related to your reflux event. Call your Cardiologist first thing in the morning regarding your EKG results today, and do not hesitate going to the ER if your symptoms worsen at any time

## 2020-08-14 NOTE — ED Triage Notes (Signed)
Pt presents with hiccups xs 1.5 wks. States is constant. Was seen at [facility] on 8/29 and was given antibiotic for bronchial infection and cough.

## 2020-08-15 ENCOUNTER — Telehealth: Payer: Self-pay | Admitting: Interventional Cardiology

## 2020-08-15 NOTE — Telephone Encounter (Signed)
Spoke with wife and moved appt up to 08/17/2020.  Wife appreciative for call.

## 2020-08-15 NOTE — Telephone Encounter (Signed)
Jason Salazar the patient's wife is calling stating Jason Salazar went to the urgent care for hiccups and was advised there has been some changes in his EKG and he should f/u with his Cardiologist in regards to it. An appointment has been scheduled for the first available on 08/31/20, but they are requesting a sooner appointment. Please advise.

## 2020-08-16 ENCOUNTER — Encounter: Payer: Self-pay | Admitting: Interventional Cardiology

## 2020-08-16 ENCOUNTER — Ambulatory Visit: Payer: Medicare Other | Admitting: Interventional Cardiology

## 2020-08-16 ENCOUNTER — Other Ambulatory Visit: Payer: Self-pay

## 2020-08-16 ENCOUNTER — Telehealth: Payer: Self-pay | Admitting: Radiology

## 2020-08-16 VITALS — BP 123/74 | HR 88 | Ht 63.0 in | Wt 185.0 lb

## 2020-08-16 DIAGNOSIS — E7800 Pure hypercholesterolemia, unspecified: Secondary | ICD-10-CM

## 2020-08-16 DIAGNOSIS — E1169 Type 2 diabetes mellitus with other specified complication: Secondary | ICD-10-CM

## 2020-08-16 DIAGNOSIS — R066 Hiccough: Secondary | ICD-10-CM

## 2020-08-16 DIAGNOSIS — R9431 Abnormal electrocardiogram [ECG] [EKG]: Secondary | ICD-10-CM

## 2020-08-16 DIAGNOSIS — Z8546 Personal history of malignant neoplasm of prostate: Secondary | ICD-10-CM

## 2020-08-16 DIAGNOSIS — I10 Essential (primary) hypertension: Secondary | ICD-10-CM

## 2020-08-16 DIAGNOSIS — I441 Atrioventricular block, second degree: Secondary | ICD-10-CM | POA: Diagnosis not present

## 2020-08-16 DIAGNOSIS — Z7189 Other specified counseling: Secondary | ICD-10-CM

## 2020-08-16 DIAGNOSIS — G4733 Obstructive sleep apnea (adult) (pediatric): Secondary | ICD-10-CM

## 2020-08-16 NOTE — Telephone Encounter (Signed)
Enrolled patient for a 30 day [employer] Event Monitor to be mailed to patients home  

## 2020-08-16 NOTE — Patient Instructions (Addendum)
Medication Instructions:  Your physician recommends that you continue on your current medications as directed. Please refer to the Current Medication list given to you today.  *If you need a refill on your cardiac medications before your next appointment, please call your pharmacy*   Lab Work: None If you have labs (blood work) drawn today and your tests are completely normal, you will receive your results only by: [name] MyChart Message (if you have MyChart) OR . A paper copy in the mail If you have any lab test that is abnormal or we need to change your treatment, we will call you to review the results.   Testing/Procedures: Your physician has requested that you have an echocardiogram. Echocardiography is a painless test that uses sound waves to create images of your heart. It provides your doctor with information about the size and shape of your heart and how well your heart's chambers and valves are working. This procedure takes approximately one hour. There are no restrictions for this procedure.   Your physician recommends that you wear a monitor for 7 days.   Follow-Up: At [facility], you and your health needs are our priority.  As part of our continuing mission to provide you with exceptional heart care, we have created designated Provider Care Teams.  These Care Teams include your primary Cardiologist (physician) and Advanced Practice Providers (APPs -  Physician Assistants and Nurse Practitioners) who all work together to provide you with the care you need, when you need it.  We recommend signing up for the patient portal called "MyChart".  Sign up information is provided on this After Visit Summary.  MyChart is used to connect with patients for Virtual Visits (Telemedicine).  Patients are able to view lab/test results, encounter notes, upcoming appointments, etc.  Non-urgent messages can be sent to your provider as well.   To learn more about what you can do with MyChart, go to  [email or web address].    Your next appointment:   12 month(s)  The format for your next appointment:   In Person  Provider:   You may see Sinclair Grooms, MD or one of the following Advanced Practice Providers on your designated Care Team:    Truitt Merle, NP  Cecilie Kicks, NP  Kathyrn Drown, NP    Other Instructions  [place] Monitor Instructions   Your physician has requested you wear your ZIO patch monitor_______days.   This is a single patch monitor.  Irhythm supplies one patch monitor per enrollment.  Additional stickers are not available.   Please do not apply patch if you will be having a Nuclear Stress Test, Echocardiogram, Cardiac CT, MRI, or Chest Xray during the time frame you would be wearing the monitor. The patch cannot be worn during these tests.  You cannot remove and re-apply the ZIO XT patch monitor.   Your ZIO patch monitor will be sent USPS Priority mail from [facility] directly to your home address. The monitor may also be mailed to a PO BOX if home delivery is not available.   It may take 3-5 days to receive your monitor after you have been enrolled.   Once you have received you monitor, please review enclosed instructions.  Your monitor has already been registered assigning a specific monitor serial # to you.   Applying the monitor   Shave hair from upper left chest.   Hold abrader disc by orange tab.  Rub abrader in 40 strokes over left upper chest  as indicated in your monitor instructions.   Clean area with 4 enclosed alcohol pads .  Use all pads to assure area is cleaned thoroughly.  Let dry.   Apply patch as indicated in monitor instructions.  Patch will be place under collarbone on left side of chest with arrow pointing upward.   Rub patch adhesive wings for 2 minutes.Remove white label marked "1".  Remove white label marked "2".  Rub patch adhesive wings for 2 additional minutes.   While looking in a mirror, press and  release button in center of patch.  A small green light will flash 3-4 times .  This will be your only indicator the monitor has been turned on.     Do not shower for the first 24 hours.  You may shower after the first 24 hours.   Press button if you feel a symptom. You will hear a small click.  Record Date, Time and Symptom in the Patient Log Book.   When you are ready to remove patch, follow instructions on last 2 pages of Patient Log Book.  Stick patch monitor onto last page of Patient Log Book.   Place Patient Log Book in [place] box.  Use locking tab on box and tape box closed securely.  The Orange and [employer] has [employer] on it.  Please place in mailbox as soon as possible.  Your physician should have your test results approximately 7 days after the monitor has been mailed back to [facility].   Call [place] at [phone] if you have questions regarding your ZIO XT patch monitor.  Call them immediately if you see an orange light blinking on your monitor.   If your monitor falls off in less than 4 days contact our Monitor department at [phone].  If your monitor becomes loose or falls off after 4 days call [employer] at [phone] for suggestions on securing your monitor.

## 2020-08-16 NOTE — Progress Notes (Signed)
Cardiology Office Note:    Date:  08/16/2020   ID:  Rutha Bouchard, DOB [birth year], MRN [id number]  PCP:  Nolene Ebbs, MD  Cardiologist:  Sinclair Grooms, MD   Referring MD: Nolene Ebbs, MD   Chief Complaint  Patient presents with  . Hypertension  . Irregular Heart Beat    History of Present Illness:    Abdimalik Mayorquin is a 74 y.o. male with a hx of essential hypertension, hypercholesterolemia, DM II, CKD 3, and OSA,  Mr. Kemmerling has developed chronic singultus" in association with an upper respiratory illness that included fever, nasal and sinus congestion, and cough.  This is been going on for approximately 2 weeks.  He received antibiotic therapy and the congestion is improving.  The diaphragmatic spasms/hiccups continue.  In the course of evaluation he was seen in to urgent care settings.  He apparently had a chest x-ray at 1 urgent care that demonstrated normal heart size and lungs without pneumonia.  [facility] urgent care visit was also accompanied by an EKG which revealed an abnormal tracing with evidence of either blocked PACs or Mobitz type I second-degree AV block.  Because of this his primary physician referred him for reevaluation.  He denies orthopnea and PND.  He has not had significant lower extremity swelling.   Past Medical History:  Diagnosis Date  . Allergic rhinitis   . Balantidiasis   . Cancer [facility])    prostate cancer treated 2007 with radiation seed placement  . Diabetes ([place])   . H/O prostate cancer    Radioactive seeds  . Hypercholesteremia   . Hyperlipidemia   . Hyperlipoproteinemia   . Hypertension   . Sleep apnea     Past Surgical History:  Procedure Laterality Date  . cyst removed from right kidney      Current Medications: Current Meds  Medication Sig  . allopurinol (ZYLOPRIM) 100 MG tablet Take 100 mg by mouth daily.   [name] amoxicillin-clavulanate (AUGMENTIN) 875-125 MG tablet Augmentin 875 mg-125 mg tablet  Take 1 tablet  every 12 hours by oral route for 10 days.  [name] aspirin EC 81 MG tablet Take 81 mg by mouth daily.  [name] b complex vitamins tablet Take 1 tablet by mouth daily.  . brinzolamide (AZOPT) 1 % ophthalmic suspension Azopt 1 % eye drops,suspension  . colchicine 0.6 MG tablet Take 0.6 mg by mouth 2 (two) times daily.  [name] dexlansoprazole (DEXILANT) 60 MG capsule Dexilant 60 mg capsule, delayed release  Take 1 capsule every day by oral route for 56 days.  . diclofenac sodium (VOLTAREN) 1 % GEL Apply 2 g topically as needed.   [name] DILANTIN 100 MG ER capsule Take 4 capsules (400 mg total) by mouth at bedtime.  . fluticasone (FLONASE) 50 MCG/ACT nasal spray Place 1 spray into both nostrils as needed.   [name] JANUMET XR 50-500 MG TB24 Take 1 tablet by mouth 2 (two) times daily.  [name] lisinopril-hydrochlorothiazide (PRINZIDE,ZESTORETIC) 20-12.5 MG tablet Take 1 tablet by mouth daily.  [name] loratadine (CLARITIN) 10 MG tablet TAKE 1 TABLET EVERY DAY AS NEEDED  . Multiple Vitamins-Minerals (ONE-A-DAY MENS 50+ ADVANTAGE PO) Take 1 tablet by mouth daily.  . rosuvastatin (CRESTOR) 10 MG tablet Take 10 mg by mouth daily.  . TRAVATAN Z 0.004 % SOLN ophthalmic solution Place 1 drop into both eyes at bedtime.      Allergies:   Patient has no known allergies.   Social History   Socioeconomic History  .  Marital status: Married    Spouse name: [name]  . Number of children: 1  . Years of education: HS  . Highest education level: Not on file  Occupational History    Employer: [employer]  Tobacco Use  . Smoking status: Never Smoker  . Smokeless tobacco: Never Used  Substance and Sexual Activity  . Alcohol use: No  . Drug use: No  . Sexual activity: Not on file  Other Topics Concern  . Not on file  Social History Narrative   Patient is married to [place] and has one son.   Caffeine Use: none; quit a few months ago             Social Determinants of Health   Financial Resource Strain:   . Difficulty of  Paying Living Expenses: Not on file  Food Insecurity:   . Worried About Charity fundraiser in the Last Year: Not on file  . Ran Out of Food in the Last Year: Not on file  Transportation Needs:   . Lack of Transportation (Medical): Not on file  . Lack of Transportation (Non-Medical): Not on file  Physical Activity:   . Days of Exercise per Week: Not on file  . Minutes of Exercise per Session: Not on file  Stress:   . Feeling of Stress : Not on file  Social Connections:   . Frequency of Communication with Friends and Family: Not on file  . Frequency of Social Gatherings with Friends and Family: Not on file  . Attends Religious Services: Not on file  . Active Member of Clubs or Organizations: Not on file  . Attends Archivist Meetings: Not on file  . Marital Status: Not on file     Family History: The patient's family history includes CVA in his mother; Prostate cancer in his father; Sleep apnea in his brother; Thyroid cancer in his father.  ROS:   Please see the history of present illness.    Doing well.  Not 100% compliant with CPAP.  All other systems reviewed and are negative.  EKGs/Labs/Other Studies Reviewed:    The following studies were reviewed today: No new imaging data Last echocardiogram 2014  EKG:  EKG performed 08/14/2020 demonstrates sinus rhythm, biatrial abnormality, low voltage, Mobitz 1 second-degree heart block.  Precordial and lateral T wave inversions.  When compared to November 2020, Mobitz 1 second-degree heart block is new.  Recent Labs: No results found for requested labs within last 8760 hours.  Recent Lipid Panel No results found for: CHOL, TRIG, HDL, CHOLHDL, VLDL, LDLCALC, LDLDIRECT  Physical Exam:    VS:  BP 123/74   Pulse 88   Ht 5\' 3"  (1.6 m)   Wt 185 lb (83.9 kg)   SpO2 98%   BMI 32.77 kg/m     Wt Readings from Last 3 Encounters:  08/16/20 185 lb (83.9 kg)  11/02/19 193 lb (87.5 kg)  10/18/19 193 lb 3.2 oz (87.6 kg)      GEN: Abdominal obesity. No acute distress HEENT: Normal NECK: No JVD. LYMPHATICS: No lymphadenopathy CARDIAC:  RRR without murmur, gallop, or edema. VASCULAR:  Normal Pulses. No bruits. RESPIRATORY:  Clear to auscultation without rales, wheezing or rhonchi  ABDOMEN: Soft, non-tender, non-distended, No pulsatile mass, MUSCULOSKELETAL: No deformity  SKIN: Warm and dry NEUROLOGIC:  Alert and oriented x 3 PSYCHIATRIC:  Normal affect   ASSESSMENT:    1. Essential hypertension   2. Hypercholesteremia   3. AV block, Mobitz 1  4. Nonspecific abnormal electrocardiogram (ECG) (EKG)   5. Type 2 diabetes mellitus with other specified complication, without long-term current use of insulin ([place])   6. Obstructive sleep apnea   7. H/O prostate cancer   8. Educated about COVID-19 virus infection   9. Singultus    PLAN:    In order of problems listed above:  1. There is excellent blood pressure control.  Intended target is 130/80 mmHg given diabetes.  Continue Zestoretic 20/12.5 mg/day 2. Continue Crestor 10 mg/day 3. A 30-day monitor will be done to assess for high-grade AV block or other arrhythmias. 4. EKG with significant repolarization abnormality and voltage abnormality raises question of hypertrophic cardiomyopathy.  2D Doppler echocardiogram will be done. 5. Hemoglobin A1c target less than 7.  No recent laboratory data.  Continue Janumet and consider SGLT2 therapy. 6. Did not discuss the prostate cancer issue 7. He is immunized for COVID-19.  Will need to get a booster as well. 68. Being evaluated by primary.   Medication Adjustments/Labs and Tests Ordered: Current medicines are reviewed at length with the patient today.  Concerns regarding medicines are outlined above.  Orders Placed This Encounter  Procedures  . LONG TERM MONITOR (3-14 DAYS)  . ECHOCARDIOGRAM COMPLETE   No orders of the defined types were placed in this encounter.   Patient Instructions  Medication  Instructions:  Your physician recommends that you continue on your current medications as directed. Please refer to the Current Medication list given to you today.  *If you need a refill on your cardiac medications before your next appointment, please call your pharmacy*   Lab Work: None If you have labs (blood work) drawn today and your tests are completely normal, you will receive your results only by: [name] MyChart Message (if you have MyChart) OR . A paper copy in the mail If you have any lab test that is abnormal or we need to change your treatment, we will call you to review the results.   Testing/Procedures: Your physician has requested that you have an echocardiogram. Echocardiography is a painless test that uses sound waves to create images of your heart. It provides your doctor with information about the size and shape of your heart and how well your heart's chambers and valves are working. This procedure takes approximately one hour. There are no restrictions for this procedure.   Your physician recommends that you wear a monitor for 7 days.   Follow-Up: At [facility], you and your health needs are our priority.  As part of our continuing mission to provide you with exceptional heart care, we have created designated Provider Care Teams.  These Care Teams include your primary Cardiologist (physician) and Advanced Practice Providers (APPs -  Physician Assistants and Nurse Practitioners) who all work together to provide you with the care you need, when you need it.  We recommend signing up for the patient portal called "MyChart".  Sign up information is provided on this After Visit Summary.  MyChart is used to connect with patients for Virtual Visits (Telemedicine).  Patients are able to view lab/test results, encounter notes, upcoming appointments, etc.  Non-urgent messages can be sent to your provider as well.   To learn more about what you can do with MyChart, go to  [email or web address].    Your next appointment:   12 month(s)  The format for your next appointment:   In Person  Provider:   You may see Sinclair Grooms, MD or one of  the following Advanced Practice Providers on your designated Care Team:    Truitt Merle, NP  Cecilie Kicks, NP  Kathyrn Drown, NP    Other Instructions  [place]- Long Term Monitor Instructions   Your physician has requested you wear your ZIO patch monitor_______days.   This is a single patch monitor.  [employer] supplies one patch monitor per enrollment.  Additional stickers are not available.   Please do not apply patch if you will be having a Nuclear Stress Test, Echocardiogram, Cardiac CT, MRI, or Chest Xray during the time frame you would be wearing the monitor. The patch cannot be worn during these tests.  You cannot remove and re-apply the ZIO XT patch monitor.   Your ZIO patch monitor will be sent USPS Priority mail from [facility] directly to your home address. The monitor may also be mailed to a PO BOX if home delivery is not available.   It may take 3-5 days to receive your monitor after you have been enrolled.   Once you have received you monitor, please review enclosed instructions.  Your monitor has already been registered assigning a specific monitor serial # to you.   Applying the monitor   Shave hair from upper left chest.   Hold abrader disc by orange tab.  Rub abrader in 40 strokes over left upper chest as indicated in your monitor instructions.   Clean area with 4 enclosed alcohol pads .  Use all pads to assure area is cleaned thoroughly.  Let dry.   Apply patch as indicated in monitor instructions.  Patch will be place under collarbone on left side of chest with arrow pointing upward.   Rub patch adhesive wings for 2 minutes.Remove white label marked "1".  Remove white label marked "2".  Rub patch adhesive wings for 2 additional minutes.   While looking in a mirror, press and  release button in center of patch.  A small green light will flash 3-4 times .  This will be your only indicator the monitor has been turned on.     Do not shower for the first 24 hours.  You may shower after the first 24 hours.   Press button if you feel a symptom. You will hear a small click.  Record Date, Time and Symptom in the Patient Log Book.   When you are ready to remove patch, follow instructions on last 2 pages of Patient Log Book.  Stick patch monitor onto last page of Patient Log Book.   Place Patient Log Book in [place] box.  Use locking tab on box and tape box closed securely.  The [employer] and [employer] has [employer] on it.  Please place in mailbox as soon as possible.  Your physician should have your test results approximately 7 days after the monitor has been mailed back to [facility].   Call [place] at [phone] if you have questions regarding your ZIO XT patch monitor.  Call them immediately if you see an orange light blinking on your monitor.   If your monitor falls off in less than 4 days contact our Monitor department at [phone].  If your monitor becomes loose or falls off after 4 days call [employer] at [phone] for suggestions on securing your monitor.      Signed, Sinclair Grooms, MD  08/16/2020 11:06 AM    [place]

## 2020-08-16 NOTE — Addendum Note (Signed)
Addended by: Loren Racer on: 08/16/2020 03:40 PM   Modules accepted: Orders

## 2020-08-17 ENCOUNTER — Ambulatory Visit: Payer: Medicare Other | Admitting: Interventional Cardiology

## 2020-08-23 ENCOUNTER — Ambulatory Visit (HOSPITAL_COMMUNITY): Payer: Medicare Other | Attending: Internal Medicine

## 2020-08-23 ENCOUNTER — Encounter (INDEPENDENT_AMBULATORY_CARE_PROVIDER_SITE_OTHER): Payer: Medicare Other

## 2020-08-23 ENCOUNTER — Other Ambulatory Visit: Payer: Self-pay

## 2020-08-23 DIAGNOSIS — I441 Atrioventricular block, second degree: Secondary | ICD-10-CM

## 2020-08-23 DIAGNOSIS — R9431 Abnormal electrocardiogram [ECG] [EKG]: Secondary | ICD-10-CM

## 2020-08-23 DIAGNOSIS — I10 Essential (primary) hypertension: Secondary | ICD-10-CM | POA: Diagnosis not present

## 2020-08-23 LAB — ECHOCARDIOGRAM COMPLETE
Area-P 1/2: 3.27 cm2
S' Lateral: 2.1 cm

## 2020-08-23 MED ORDER — PERFLUTREN LIPID MICROSPHERE
1.0000 mL | INTRAVENOUS | Status: AC | PRN
  Administered 2020-08-23: 2 mL via INTRAVENOUS

## 2020-08-25 ENCOUNTER — Telehealth: Payer: Self-pay | Admitting: Interventional Cardiology

## 2020-08-25 NOTE — Telephone Encounter (Signed)
Jason Crome, MD  08/24/2020 4:07 PM EDT     Let the patient know he has thick heart muscle with normal pumping.I am concerned about hypertrophic cardiomyopathy.Goal is good BP control < 130/80 mmHg. A copy will be sent to Nolene Ebbs, MD   Spoke with patient who is aware of results and will continue to monitor his BP.  Of note - he reports he has started walking and his wt is down 9 lbs.

## 2020-08-25 NOTE — Telephone Encounter (Signed)
New message ° ° ° ° ° °Returning a call to the nurse to get echo results °

## 2020-08-31 ENCOUNTER — Telehealth: Payer: Self-pay | Admitting: Interventional Cardiology

## 2020-08-31 ENCOUNTER — Ambulatory Visit: Payer: Medicare Other | Admitting: Interventional Cardiology

## 2020-08-31 NOTE — Telephone Encounter (Signed)
New Message:    Pt says he needs to know if he is supposed to be wearing his Monitor for 7 days or 30 days please?

## 2020-08-31 NOTE — Telephone Encounter (Signed)
Called to inform Jason Salazar  the order placed was for a 30 day Cardiac Event Monitor.   He was very upset that return call came from monitor department.  He asked specifically to speak to Jason Salazar. Apologized to patient and verified office note did state 30 day event monitor under plan. Patient was also upset regarding some type of injection that was not  given prior to a test, because it was not ordered. Jason Salazar stated, he is not happy with his care at [facility] and expressed his intent of leaving the practice.  He plans to write a letter expressing his dissatisfaction.

## 2020-09-02 NOTE — Telephone Encounter (Signed)
Aware he is unhappy with care. Is he the patient who was concerned about receiving [employer]? Cindee Salt is the issue with the monitor? He can wear for a shorter time frame, but may not reveal accurate data. Let him know we can send the information to his next Cardiologist either in our practice or elsewhere if he provides info.

## 2020-09-04 ENCOUNTER — Encounter: Payer: Self-pay | Admitting: *Deleted

## 2020-09-04 ENCOUNTER — Telehealth: Payer: Self-pay | Admitting: Interventional Cardiology

## 2020-09-04 NOTE — Telephone Encounter (Signed)
Follow Up:     Pt says he know the monitors was for 30 days. He only wanted to know if that the amount of days Dr Tamala Julian wants him to wear it?  If Dr Tamala Julian says 30 days, he will wear it for 30 days.

## 2020-09-04 NOTE — Telephone Encounter (Signed)
He was the pt that was upset about the definity.  No issues with the monitor.  I don't believe there are any issues with the monitor.  He was calling to clarify how long he was suppose to wear it and was upset at the discrepancy in timeframe but he knows to wear it for 30 days.

## 2020-09-04 NOTE — Telephone Encounter (Signed)
Spoke with Jason Salazar and made him aware that Dr. Tamala Julian does want him to wear a monitor for 30 days, instead of the 7 days mentioned at the time of the appt.  Jason Salazar would like something in writing stating this.  Advised I will place a letter at the front desk.  He will pick this up since it takes so long to get to him in the mail.

## 2020-10-02 ENCOUNTER — Telehealth: Payer: Self-pay | Admitting: Interventional Cardiology

## 2020-10-02 NOTE — Telephone Encounter (Signed)
Encounter not needed

## 2020-10-25 ENCOUNTER — Telehealth: Payer: Self-pay | Admitting: Interventional Cardiology

## 2020-10-25 NOTE — Telephone Encounter (Signed)
Patient's wife, Jason Salazar (no DPR), would like to review patient's monitor results. She states she is aware that the patient already discussed the results, however, she would like to ensure that everything is alright. Jason Salazar is requesting a call back after 1:45 PM, as she states she will be around the patient at this time.

## 2020-10-25 NOTE — Telephone Encounter (Signed)
Left message to call office

## 2020-10-25 NOTE — Telephone Encounter (Signed)
I spoke with patient who gave permission to speak with his wife.  I reviewed monitor results with patient's wife.

## 2020-10-25 NOTE — Telephone Encounter (Signed)
Patient's wife is returning call. 

## 2020-11-01 ENCOUNTER — Encounter: Payer: Self-pay | Admitting: Family Medicine

## 2020-11-01 ENCOUNTER — Ambulatory Visit: Payer: Medicare HMO | Admitting: Family Medicine

## 2020-11-01 VITALS — BP 128/71 | HR 86 | Ht 64.0 in | Wt 189.0 lb

## 2020-11-01 DIAGNOSIS — G40309 Generalized idiopathic epilepsy and epileptic syndromes, not intractable, without status epilepticus: Secondary | ICD-10-CM

## 2020-11-01 DIAGNOSIS — Z79899 Other long term (current) drug therapy: Secondary | ICD-10-CM | POA: Diagnosis not present

## 2020-11-01 MED ORDER — DILANTIN 100 MG PO CAPS: 400.0000 mg | ORAL_CAPSULE | Freq: Every day | ORAL | 4 refills | Status: DC

## 2020-11-01 NOTE — Progress Notes (Signed)
Chief Complaint  Patient presents with  . Follow-up    sz fu, alone, rm 2, pt states he is stable      HISTORY OF PRESENT ILLNESS: Today 11/01/20  Jason Salazar is a 74 y.o. male here today for follow up for seizures. He continues phenytoin and tolerating well. No seizures. He is followed closely by PCP. Recent labs were normal. Patient reports creatinine now normal. Phenytoin level not checked.    HISTORY (copied from previous note)  UPDATE (11/02/19, VRP): Since last visit, doing well. Symptoms are stable. No seizures. No alleviating or aggravating factors. Tolerating dilantin. Last seizure ~ 2010 or earlier.   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 Seizure disorder beginning at the age of 40 following a head injury. No seizures in several years now. He is currently on brand Dilantin without side effects. No balance issues no falls no daytime drowsiness. He has a history of obstructive sleep apnea and uses CPAP. He is retired. He returns for reevaluation he needs labs and refills  UPDATE (10/29/17, CM)Jason Salazar, 74year-old black male returns for yearly followup. 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 No seizures in several years. He denies any missed doses of his medication. His seizure disorder began at age 76 following a head injury. He denies any side effects to his Dilantin, (BRAND). He denies any falls. He denies any daytime drowsiness. He also is diabetic but says his blood sugars are in good control. [name] He gets no regular exercise. He has had some numbness in his feet no pain. He also has a history of high blood pressure and sleep disorder for which he uses CPAP at 13 cm of pressure. He is Retired. He returns for reevaluation, labs and  refills.    REVIEW OF SYSTEMS: Out of a complete 14 system review of symptoms, the patient complains only of the following symptoms, lower extremity edema and all other reviewed systems are negative.   ALLERGIES: No Known Allergies   HOME MEDICATIONS: Outpatient Medications Prior to Visit  Medication Sig Dispense Refill  . allopurinol (ZYLOPRIM) 100 MG tablet Take 100 mg by mouth daily.     [name] amoxicillin-clavulanate (AUGMENTIN) 875-125 MG tablet Augmentin 875 mg-125 mg tablet  Take 1 tablet every 12 hours by oral route for 10 days.    [name] aspirin EC 81 MG tablet Take 81 mg by mouth daily.    [name] b complex vitamins tablet Take 1 tablet by mouth daily.    . brinzolamide (AZOPT) 1 % ophthalmic suspension Azopt 1 % eye drops,suspension    . colchicine 0.6 MG tablet Take 0.6 mg by mouth 2 (two) times daily.    [name] dexlansoprazole (DEXILANT) 60 MG capsule Dexilant 60 mg capsule, delayed release  Take 1 capsule every day by oral route for 56 days. 30 capsule 0  . diclofenac sodium (VOLTAREN) 1 % GEL Apply 2 g topically as needed.     . fluticasone (FLONASE) 50 MCG/ACT nasal spray Place 1 spray into both nostrils as needed.     [name] JANUMET XR 50-500 MG TB24 Take 1 tablet by mouth 2 (two) times daily.    [name] lisinopril-hydrochlorothiazide (PRINZIDE,ZESTORETIC) 20-12.5 MG tablet Take 1 tablet by mouth daily. 7 tablet 0  . mometasone (NASONEX) 50 MCG/ACT nasal spray 2 sprays as  needed.    . Multiple Vitamins-Minerals (ONE-A-DAY MENS 50+ ADVANTAGE PO) Take 1 tablet by mouth daily.    . rosuvastatin (CRESTOR) 10 MG tablet Take 10 mg by mouth daily.    . TRAVATAN Z 0.004 % SOLN ophthalmic solution Place 1 drop into both eyes at bedtime.     [name] DILANTIN 100 MG ER capsule Take 4 capsules (400 mg total) by mouth at bedtime. 360 capsule 4  . loratadine (CLARITIN) 10 MG tablet TAKE 1 TABLET EVERY DAY AS NEEDED  5   No facility-administered medications prior to visit.     PAST MEDICAL HISTORY: Past Medical  History:  Diagnosis Date  . Allergic rhinitis   . Balantidiasis   . Cancer [facility])    prostate cancer treated 2007 with radiation seed placement  . Diabetes ([place])   . H/O prostate cancer    Radioactive seeds  . Hypercholesteremia   . Hyperlipidemia   . Hyperlipoproteinemia   . Hypertension   . Sleep apnea      PAST SURGICAL HISTORY: Past Surgical History:  Procedure Laterality Date  . cyst removed from right kidney       FAMILY HISTORY: Family History  Problem Relation Age of Onset  . CVA Mother   . Sleep apnea Brother   . Thyroid cancer Father   . Prostate cancer Father      SOCIAL HISTORY: Social History   Socioeconomic History  . Marital status: Married    Spouse name: [name]  . Number of children: 1  . Years of education: HS  . Highest education level: Not on file  Occupational History    Employer: [employer]  Tobacco Use  . Smoking status: Never Smoker  . Smokeless tobacco: Never Used  Substance and Sexual Activity  . Alcohol use: No  . Drug use: No  . Sexual activity: Not on file  Other Topics Concern  . Not on file  Social History Narrative   Patient is married to [place] and has one son.   Caffeine Use: none; quit a few months ago             Social Determinants of Health   Financial Resource Strain:   . Difficulty of Paying Living Expenses: Not on file  Food Insecurity:   . Worried About Charity fundraiser in the Last Year: Not on file  . Ran Out of Food in the Last Year: Not on file  Transportation Needs:   . Lack of Transportation (Medical): Not on file  . Lack of Transportation (Non-Medical): Not on file  Physical Activity:   . Days of Exercise per Week: Not on file  . Minutes of Exercise per Session: Not on file  Stress:   . Feeling of Stress : Not on file  Social Connections:   . Frequency of Communication with Friends and Family: Not on file  . Frequency of Social Gatherings with Friends and Family: Not on file   . Attends Religious Services: Not on file  . Active Member of Clubs or Organizations: Not on file  . Attends Archivist Meetings: Not on file  . Marital Status: Not on file  Intimate Partner Violence:   . Fear of Current or Ex-Partner: Not on file  . Emotionally Abused: Not on file  . Physically Abused: Not on file  . Sexually Abused: Not on file      PHYSICAL EXAM  Vitals:   11/01/20 1007  BP: 128/71  Pulse: 86  Weight: 189 lb (85.7 kg)  Height: 5\' 4"  (1.626 m)   Body mass index is 32.44 kg/m.   Generalized: Well developed, in no acute distress  Cardiology: normal rate and rhythm, no murmur auscultated  Respiratory: clear to auscultation bilaterally    Neurological examination  Mentation: Alert oriented to time, place, history taking. Follows all commands speech and language fluent Cranial nerve II-XII: Pupils were equal round reactive to light. Extraocular movements were full, visual field were full on confrontational test. Facial sensation and strength were normal. Head turning and shoulder shrug  were normal and symmetric. Motor: The motor testing reveals 5 over 5 strength of all 4 extremities. Good symmetric motor tone is noted throughout.  Sensory: Sensory testing is intact to soft touch on all 4 extremities. No evidence of extinction is noted.  Coordination: Cerebellar testing reveals good finger-nose-finger and heel-to-shin bilaterally.  Gait and station: Gait is normal.  Reflexes: Deep tendon reflexes are symmetric and normal bilaterally.     DIAGNOSTIC DATA (LABS, IMAGING, TESTING) - I reviewed patient records, labs, notes, testing and imaging myself where available.  Lab Results  Component Value Date   WBC 5.2 10/29/2018   HGB 12.8 (L) 10/29/2018   HCT 39.4 10/29/2018   MCV 87 10/29/2018   PLT 157 10/29/2018      Component Value Date/Time   NA 137 10/29/2018 1044   K 4.7 10/29/2018 1044   CL 101 10/29/2018 1044   CO2 20 10/29/2018 1044    GLUCOSE 204 (H) 10/29/2018 1044   GLUCOSE 110 (H) 02/19/2014 0843   BUN 28 (H) 10/29/2018 1044   CREATININE 1.53 (H) 10/29/2018 1044   CALCIUM 9.2 10/29/2018 1044   PROT 6.4 10/29/2018 1044   ALBUMIN 4.3 10/29/2018 1044   AST 17 10/29/2018 1044   ALT 31 10/29/2018 1044   ALKPHOS 129 (H) 10/29/2018 1044   BILITOT <0.2 10/29/2018 1044   GFRNONAA 45 (L) 10/29/2018 1044   GFRAA 52 (L) 10/29/2018 1044   No results found for: CHOL, HDL, LDLCALC, LDLDIRECT, TRIG, CHOLHDL No results found for: HGBA1C No results found for: VITAMINB12 No results found for: TSH    ASSESSMENT AND PLAN  74 y.o. year old male  has a past medical history of Allergic rhinitis, Balantidiasis, Cancer ([place]), Diabetes ([place]), H/O prostate cancer, Hypercholesteremia, Hyperlipidemia, Hyperlipoproteinemia, Hypertension, and Sleep apnea. here with   Generalized convulsive epilepsy ([place]) - Plan: Phenytoin Level, Total, DILANTIN 100 MG ER capsule, Vitamin D, 25-hydroxy  He is doing well. We will continue phenytoin 400mg  daily. I will update labs today. Consider DEXA screening. He will discuss with PCP. Healthy lifestyle habits encouraged. Seizure precautions advised. He will follow up annually, sooner if needed.   I spent 20 minutes of face-to-face and non-face-to-face time with patient.  This included previsit chart review, lab review, study review, order entry, electronic health record documentation, patient education.    Debbora Presto, MSN, FNP-C 11/01/2020, 10:42 AM  [facility] [address] [place], [place] [address] [phone]

## 2020-11-01 NOTE — Patient Instructions (Signed)
Below is our plan:  We will continue Dilantin 400mg  at bedtime. Consider Dexa screening.   Please make sure you are staying well hydrated. I recommend 50-60 ounces daily. Well balanced diet and regular exercise encouraged.    Please continue follow up with care team as directed.   Follow up in 1 year   You may receive a survey regarding today's visit. I encourage you to leave honest feed back as I do use this information to improve patient care. Thank you for seeing me today!      Seizure, Adult A seizure is a sudden burst of abnormal electrical activity in the brain. Seizures usually last from 30 seconds to 2 minutes. They can cause many different symptoms. Usually, seizures are not harmful unless they last a long time. What are the causes? Common causes of this condition include:  Fever or infection.  Conditions that affect the brain, such as: ? A brain abnormality that you were born with. ? A brain or head injury. ? Bleeding in the brain. ? A tumor. ? Stroke. ? Brain disorders such as autism or cerebral palsy.  Low blood sugar.  Conditions that are passed from parent to child (are inherited).  Problems with substances, such as: ? Having a reaction to a drug or a medicine. ? Suddenly stopping the use of a substance (withdrawal). In some cases, the cause may not be known. A person who has repeated seizures over time without a clear cause has a condition called epilepsy. What increases the risk? You are more likely to get this condition if you have:  A family history of epilepsy.  Had a seizure in the past.  A brain disorder.  A history of head injury, lack of oxygen at birth, or strokes. What are the signs or symptoms? There are many types of seizures. The symptoms vary depending on the type of seizure you have. Examples of symptoms during a seizure include:  Shaking (convulsions).  Stiffness in the body.  Passing out (losing consciousness).  Head  nodding.  Staring.  Not responding to sound or touch.  Loss of bladder control and bowel control. Some people have symptoms right before and right after a seizure happens. Symptoms before a seizure may include:  Fear.  Worry (anxiety).  Feeling like you may vomit (nauseous).  Feeling like the room is spinning (vertigo).  Feeling like you saw or heard something before (dj vu).  Odd tastes or smells.  Changes in how you see. You may see flashing lights or spots. Symptoms after a seizure happens can include:  Confusion.  Sleepiness.  Headache.  Weakness on one side of the body. How is this treated? Most seizures will stop on their own in under 5 minutes. In these cases, no treatment is needed. Seizures that last longer than 5 minutes will usually need treatment. Treatment can include:  Medicines given through an IV tube.  Avoiding things that are known to cause your seizures. These can include medicines that you take for another condition.  Medicines to treat epilepsy.  Surgery to stop the seizures. This may be needed if medicines do not help. Follow these instructions at home: Medicines  Take over-the-counter and prescription medicines only as told by your doctor.  Do not eat or drink anything that may keep your medicine from working, such as alcohol. Activity  Do not do any activities that would be dangerous if you had another seizure, like driving or swimming. Wait until your doctor says it is  safe for you to do them.  If you live in the [place] ask your local DMV (department of motor vehicles) when you can drive.  Get plenty of rest. Teaching others Teach friends and family what to do when you have a seizure. They should:  Lay you on the ground.  Protect your head and body.  Loosen any tight clothing around your neck.  Turn you on your side.  Not hold you down.  Not put anything into your mouth.  Know whether or not you need emergency  care.  Stay with you until you are better.  General instructions  Contact your doctor each time you have a seizure.  Avoid anything that gives you seizures.  Keep a seizure diary. Write down: ? What you think caused each seizure. ? What you remember about each seizure.  Keep all follow-up visits as told by your doctor. This is important. Contact a doctor if:  You have another seizure.  You have seizures more often.  There is any change in what happens during your seizures.  You keep having seizures with treatment.  You have symptoms of being sick or having an infection. Get help right away if:  You have a seizure that: ? Lasts longer than 5 minutes. ? Is different than seizures you had before. ? Makes it harder to breathe. ? Happens after you hurt your head.  You have any of these symptoms after a seizure: ? Not being able to speak. ? Not being able to use a part of your body. ? Confusion. ? A bad headache.  You have two or more seizures in a row.  You do not wake up right after a seizure.  You get hurt during a seizure. These symptoms may be an emergency. Do not wait to see if the symptoms will go away. Get medical help right away. Call your local emergency services (911 in the [place] Do not drive yourself to the hospital. Summary  Seizures usually last from 30 seconds to 2 minutes. Usually, they are not harmful unless they last a long time.  Do not eat or drink anything that may keep your medicine from working, such as alcohol.  Teach friends and family what to do when you have a seizure.  Contact your doctor each time you have a seizure. This information is not intended to replace advice given to you by your health care provider. Make sure you discuss any questions you have with your health care provider. Document Revised: 02/12/2019 Document Reviewed: 02/12/2019 Elsevier Patient Education  [place].

## 2020-11-02 LAB — VITAMIN D 25 HYDROXY (VIT D DEFICIENCY, FRACTURES): Vit D, 25-Hydroxy: 25.7 ng/mL — ABNORMAL LOW (ref 30.0–100.0)

## 2020-11-02 LAB — PHENYTOIN LEVEL, TOTAL: Phenytoin (Dilantin), Serum: 32 ug/mL (ref 10.0–20.0)

## 2020-11-05 NOTE — Progress Notes (Signed)
I reviewed note and agree with plan.   Penni Bombard, MD 24/40/1027, 25:36 PM Certified in Neurology, Neurophysiology and Neuroimaging  [facility] [address], [place], [place] [address] [phone]

## 2020-11-07 ENCOUNTER — Encounter: Payer: Self-pay | Admitting: Podiatry

## 2020-11-07 ENCOUNTER — Ambulatory Visit: Payer: Medicare Other | Admitting: Podiatry

## 2020-11-07 ENCOUNTER — Other Ambulatory Visit: Payer: Self-pay

## 2020-11-07 DIAGNOSIS — M5432 Sciatica, left side: Secondary | ICD-10-CM

## 2020-11-07 DIAGNOSIS — I872 Venous insufficiency (chronic) (peripheral): Secondary | ICD-10-CM

## 2020-11-07 DIAGNOSIS — E1142 Type 2 diabetes mellitus with diabetic polyneuropathy: Secondary | ICD-10-CM | POA: Diagnosis not present

## 2020-11-07 DIAGNOSIS — M5431 Sciatica, right side: Secondary | ICD-10-CM

## 2020-11-07 DIAGNOSIS — R2681 Unsteadiness on feet: Secondary | ICD-10-CM

## 2020-11-07 NOTE — Progress Notes (Signed)
Subjective:  Patient ID: Jason Salazar, male    DOB: 15-Jan-1946,  MRN: [id number] HPI Chief Complaint  Patient presents with  . Diabetes    Diabetic Foot Exam - last a1c was 7.5, PCP referred-noticed swelling in right foot "no pain" and also this morning had a pain around the left ankle    74 y.o. male presents with the above complaint.   ROS: Denies fever chills nausea vomiting muscle aches pains calf pain back pain chest pain shortness of breath.  Past Medical History:  Diagnosis Date  . Allergic rhinitis   . Balantidiasis   . Cancer [facility])    prostate cancer treated 2007 with radiation seed placement  . Diabetes ([place])   . H/O prostate cancer    Radioactive seeds  . Hypercholesteremia   . Hyperlipidemia   . Hyperlipoproteinemia   . Hypertension   . Sleep apnea    Past Surgical History:  Procedure Laterality Date  . cyst removed from right kidney      Current Outpatient Medications:  .  allopurinol (ZYLOPRIM) 100 MG tablet, Take 100 mg by mouth daily. , Disp: , Rfl:  .  amoxicillin-clavulanate (AUGMENTIN) 875-125 MG tablet, Augmentin 875 mg-125 mg tablet  Take 1 tablet every 12 hours by oral route for 10 days., Disp: , Rfl:  .  aspirin EC 81 MG tablet, Take 81 mg by mouth daily., Disp: , Rfl:  .  b complex vitamins tablet, Take 1 tablet by mouth daily., Disp: , Rfl:  .  brinzolamide (AZOPT) 1 % ophthalmic suspension, Azopt 1 % eye drops,suspension, Disp: , Rfl:  .  cloNIDine (CATAPRES) 0.1 MG tablet, Take 0.1 mg by mouth at bedtime., Disp: , Rfl:  .  clopidogrel (PLAVIX) 75 MG tablet, Take 75 mg by mouth daily., Disp: , Rfl:  .  colchicine 0.6 MG tablet, Take 0.6 mg by mouth 2 (two) times daily., Disp: , Rfl:  .  dexlansoprazole (DEXILANT) 60 MG capsule, Dexilant 60 mg capsule, delayed release  Take 1 capsule every day by oral route for 56 days., Disp: 30 capsule, Rfl: 0 .  diclofenac sodium (VOLTAREN) 1 % GEL, Apply 2 g topically as needed. , Disp: , Rfl:  .  DILANTIN  100 MG ER capsule, Take 4 capsules (400 mg total) by mouth at bedtime., Disp: 360 capsule, Rfl: 4 .  fluticasone (FLONASE) 50 MCG/ACT nasal spray, Place 1 spray into both nostrils as needed. , Disp: , Rfl:  .  furosemide (LASIX) 20 MG tablet, Take 20 mg by mouth daily., Disp: , Rfl:  .  JANUMET XR 50-500 MG TB24, Take 1 tablet by mouth 2 (two) times daily., Disp: , Rfl:  .  lisinopril-hydrochlorothiazide (PRINZIDE,ZESTORETIC) 20-12.5 MG tablet, Take 1 tablet by mouth daily., Disp: 7 tablet, Rfl: 0 .  metoCLOPramide (REGLAN) 5 MG tablet, Take 5 mg by mouth 3 (three) times daily as needed., Disp: , Rfl:  .  mometasone (NASONEX) 50 MCG/ACT nasal spray, 2 sprays as needed., Disp: , Rfl:  .  Multiple Vitamins-Minerals (ONE-A-DAY MENS 50+ ADVANTAGE PO), Take 1 tablet by mouth daily., Disp: , Rfl:  .  rosuvastatin (CRESTOR) 10 MG tablet, Take 10 mg by mouth daily., Disp: , Rfl:  .  TRAVATAN Z 0.004 % SOLN ophthalmic solution, Place 1 drop into both eyes at bedtime. , Disp: , Rfl:   No Known Allergies Review of Systems Objective:  There were no vitals filed for this visit.  General: Well developed, nourished, in no acute distress, alert  and oriented x3   Dermatological: Skin is warm, dry and supple bilateral. Nails x 10 are well maintained; remaining integument appears unremarkable at this time. There are no open sores, no preulcerative lesions, no rash or signs of infection present.  Vascular: Dorsalis Pedis artery and Posterior Tibial artery pedal pulses are 2/4 bilateral with immedate capillary fill time. Pedal hair growth present. No varicosities and no lower extremity edema present bilateral. Mild pitting edema to the right lower extremity. No open lesions or wounds are noted.  Neruologic: Grossly intact via light touch bilateral. Vibratory intact via tuning fork bilateral. Protective threshold with Semmes Wienstein monofilament intact to all pedal sites bilateral. Patellar and Achilles deep  tendon reflexes 2+ bilateral. No Babinski or clonus noted bilateral. He does have a slight loss of neurologic sensorium per Semmes Weinstein monofilament to toes #345 of the right foot.  Musculoskeletal: No gross boney pedal deformities bilateral. No pain, crepitus, or limitation noted with foot and ankle range of motion bilateral. Muscular strength 5/5 in all groups tested bilateral. Mild hammertoe deformities bilateral. He has some reproducible ankle pain left but there is some crepitation of the subtalar joint and the ankle.  Gait: Unassisted, Nonantalgic.    Radiographs:  No radiographs taken today.  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 Balance and instability issues.  Plan: Discussed etiology pathology conservative versus surgical therapies. We will schedule him for venous insufficiency studies and also refer him to physical therapy for gait training due to his balance issues.     Jiovanna Frei T. [place], [place]

## 2020-11-08 ENCOUNTER — Telehealth: Payer: Self-pay | Admitting: *Deleted

## 2020-11-08 NOTE — Telephone Encounter (Signed)
LMVM for pt to return call for lab results.  

## 2020-11-08 NOTE — Telephone Encounter (Signed)
-----   Message from Debbora Presto, NP sent at 11/08/2020  4:43 PM EST ----- Please let him know that his vitamin D levels were just a touch low. I recommend he start 1,000 iu daily over the counter. His dilantin level was also elevated. As long as he is feeling well, I would like to continue monitoring this level. If he is willing, I will have him come in next week to recheck level. I would like for him to come in after lunch any day m-th next week and we can recheck to make sure this was level is accurate.

## 2020-11-09 ENCOUNTER — Encounter: Payer: Self-pay | Admitting: *Deleted

## 2020-11-09 NOTE — Telephone Encounter (Signed)
Mailed letter to pt. LMVM for pt that calling with results.

## 2020-11-14 ENCOUNTER — Encounter (HOSPITAL_COMMUNITY): Payer: Medicare Other

## 2020-11-14 ENCOUNTER — Ambulatory Visit (HOSPITAL_COMMUNITY): Payer: Medicare Other

## 2020-11-15 ENCOUNTER — Ambulatory Visit (HOSPITAL_COMMUNITY)
Admission: RE | Admit: 2020-11-15 | Discharge: 2020-11-15 | Disposition: A | Discharge status: Home / Self Care | Payer: Medicare Other | Source: Ambulatory Visit | Attending: Podiatry | Admitting: Podiatry

## 2020-11-15 ENCOUNTER — Telehealth: Payer: Self-pay

## 2020-11-15 ENCOUNTER — Other Ambulatory Visit: Payer: Self-pay

## 2020-11-15 DIAGNOSIS — I872 Venous insufficiency (chronic) (peripheral): Secondary | ICD-10-CM | POA: Diagnosis not present

## 2020-11-15 NOTE — Telephone Encounter (Signed)
[facility] called today and wanted to update Dr. Milinda Pointer on the pt results. Vas [place] lower extremity venous duplex  Negative for DVT and superficial venous thrombosis.

## 2020-11-22 ENCOUNTER — Other Ambulatory Visit: Payer: Self-pay | Admitting: Podiatry

## 2020-11-22 ENCOUNTER — Telehealth: Payer: Self-pay | Admitting: *Deleted

## 2020-11-22 DIAGNOSIS — I872 Venous insufficiency (chronic) (peripheral): Secondary | ICD-10-CM

## 2020-11-22 NOTE — Telephone Encounter (Signed)
-----   Message from Garrel Ridgel, [place] sent at 11/21/2020  7:50 AM EST ----- These studies were supposed to be for Venous Insuff. Not for DVT/Phlebitis. How were the orders put in?

## 2020-11-22 NOTE — Telephone Encounter (Signed)
Study ordered did cover blood flow concerns, however, did not evaluate for venous insufficiency. Spoke to [place] at [facility]. Advised to put in new order for LE Venous Reflux and she would contact patient to reappoint for further testing.   Orders placed for LE Venous Reflux today.

## 2020-11-24 ENCOUNTER — Ambulatory Visit (HOSPITAL_COMMUNITY)
Admission: RE | Admit: 2020-11-24 | Discharge: 2020-11-24 | Disposition: A | Discharge status: Home / Self Care | Payer: Medicare Other | Source: Ambulatory Visit | Attending: Podiatry | Admitting: Podiatry

## 2020-11-24 ENCOUNTER — Other Ambulatory Visit: Payer: Self-pay

## 2020-11-24 DIAGNOSIS — I872 Venous insufficiency (chronic) (peripheral): Secondary | ICD-10-CM | POA: Insufficient documentation

## 2020-11-27 ENCOUNTER — Telehealth: Payer: Self-pay | Admitting: *Deleted

## 2020-11-27 DIAGNOSIS — I872 Venous insufficiency (chronic) (peripheral): Secondary | ICD-10-CM

## 2020-11-27 NOTE — Telephone Encounter (Signed)
-----   Message from Garrel Ridgel, [place] sent at 11/27/2020  6:52 AM EST ----- Can he follow up with vascular for treatment.

## 2020-11-27 NOTE — Telephone Encounter (Signed)
Referral to vascular for treatment placed today

## 2020-12-07 ENCOUNTER — Other Ambulatory Visit (INDEPENDENT_AMBULATORY_CARE_PROVIDER_SITE_OTHER): Payer: Self-pay

## 2020-12-07 ENCOUNTER — Other Ambulatory Visit: Payer: Self-pay | Admitting: *Deleted

## 2020-12-07 DIAGNOSIS — G40309 Generalized idiopathic epilepsy and epileptic syndromes, not intractable, without status epilepticus: Secondary | ICD-10-CM

## 2020-12-07 DIAGNOSIS — Z0289 Encounter for other administrative examinations: Secondary | ICD-10-CM

## 2020-12-08 LAB — PHENYTOIN LEVEL, TOTAL: Phenytoin (Dilantin), Serum: 24.8 ug/mL (ref 10.0–20.0)

## 2020-12-11 ENCOUNTER — Telehealth: Payer: Self-pay | Admitting: *Deleted

## 2020-12-11 NOTE — Telephone Encounter (Signed)
LMVM for pt that dilantin is better. Still slightly elevated but will continue to monitor.  Continue current treatment plan.  Pt to call back if questions.

## 2020-12-11 NOTE — Telephone Encounter (Signed)
-----   Message from Shawnie Dapper, NP sent at 12/11/2020  9:02 AM EST ----- Please let him know that his dilantin level is better. It is still a little elevated but most likely related to timing of dose. We will continue to monitor closely. Continue current treatment plan.

## 2021-01-25 ENCOUNTER — Ambulatory Visit: Payer: Medicare Other | Admitting: Vascular Surgery

## 2021-01-25 ENCOUNTER — Other Ambulatory Visit: Payer: Self-pay

## 2021-01-25 ENCOUNTER — Encounter: Payer: Self-pay | Admitting: Vascular Surgery

## 2021-01-25 VITALS — BP 126/79 | HR 91 | Temp 98.2°F | Resp 20 | Ht 64.0 in | Wt 185.0 lb

## 2021-01-25 DIAGNOSIS — M7989 Other specified soft tissue disorders: Secondary | ICD-10-CM | POA: Diagnosis not present

## 2021-01-25 NOTE — Progress Notes (Signed)
Referring Physician: Dr Tyson Dense  Patient name: Jason Salazar MRN: [id number] DOB: [birth year] Sex: male  REASON FOR CONSULT: Chronic right leg swelling HPI: Jason Salazar is a 75 y.o. male, with a several year history of right leg swelling.  He has been prescribed compression stockings in the past but he has never really worn these.  He occasionally has some numbness and tingling in his toes.  He has been told in the past that he has neuropathy.  He has also had episodes of gout in the past affecting the top of his right foot left knee and left wrist.  He currently does not have any active gout.  He is on aspirin and a statin.  Other medical problems include diabetes elevated cholesterol hypertension.  He has had a remote lacunar stroke by CT scan.  He has had prostate cancer treatment in the past with radiation seed placement.  He has no prior history of DVT.  Past Medical History:  Diagnosis Date  . Allergic rhinitis   . Balantidiasis   . Cancer [facility])    prostate cancer treated 2007 with radiation seed placement  . Diabetes ([place])   . H/O prostate cancer    Radioactive seeds  . Hypercholesteremia   . Hyperlipidemia   . Hyperlipoproteinemia   . Hypertension   . Sleep apnea    Past Surgical History:  Procedure Laterality Date  . cyst removed from right kidney      Family History  Problem Relation Age of Onset  . CVA Mother   . Sleep apnea Brother   . Thyroid cancer Father   . Prostate cancer Father     SOCIAL HISTORY: Social History   Socioeconomic History  . Marital status: Married    Spouse name: [name]  . Number of children: 1  . Years of education: HS  . Highest education level: Not on file  Occupational History    Employer: [employer]  Tobacco Use  . Smoking status: Never Smoker  . Smokeless tobacco: Never Used  Vaping Use  . Vaping Use: Never used  Substance and Sexual Activity  . Alcohol use: No  . Drug use: No  . Sexual activity: Not  on file  Other Topics Concern  . Not on file  Social History Narrative   Patient is married to Jason Salazar and has one son.   Caffeine Use: none; quit a few months ago             Social Determinants of Radio broadcast assistant Strain: Not on file  Food Insecurity: Not on file  Transportation Needs: Not on file  Physical Activity: Not on file  Stress: Not on file  Social Connections: Not on file  Intimate Partner Violence: Not on file    Allergies  Allergen Reactions  . Atorvastatin     Other reaction(s): lightheaded    Current Outpatient Medications  Medication Sig Dispense Refill  . allopurinol (ZYLOPRIM) 100 MG tablet Take 100 mg by mouth daily.     [name] aspirin EC 81 MG tablet Take 81 mg by mouth daily.    [name] b complex vitamins tablet Take 1 tablet by mouth daily.    . brinzolamide (AZOPT) 1 % ophthalmic suspension Azopt 1 % eye drops,suspension    . cloNIDine (CATAPRES) 0.1 MG tablet Take 0.1 mg by mouth at bedtime.    . colchicine 0.6 MG tablet Take 0.6 mg by mouth 2 (two) times daily.    [name] DILANTIN  100 MG ER capsule Take 4 capsules (400 mg total) by mouth at bedtime. 360 capsule 4  . fluticasone (FLONASE) 50 MCG/ACT nasal spray Place 1 spray into both nostrils as needed.     [name] JANUMET XR 50-500 MG TB24 Take 1 tablet by mouth 2 (two) times daily.    [name] lisinopril-hydrochlorothiazide (PRINZIDE,ZESTORETIC) 20-12.5 MG tablet Take 1 tablet by mouth daily. 7 tablet 0  . Multiple Vitamins-Minerals (ONE-A-DAY MENS 50+ ADVANTAGE PO) Take 1 tablet by mouth daily.    . rosuvastatin (CRESTOR) 10 MG tablet Take 10 mg by mouth daily.    . TRAVATAN Z 0.004 % SOLN ophthalmic solution Place 1 drop into both eyes at bedtime.     [name] dexlansoprazole (DEXILANT) 60 MG capsule Dexilant 60 mg capsule, delayed release  Take 1 capsule every day by oral route for 56 days. (Patient not taking: Reported on 01/25/2021) 30 capsule 0  . diclofenac sodium (VOLTAREN) 1 % GEL Apply 2 g topically as needed.      . furosemide (LASIX) 20 MG tablet Take 20 mg by mouth daily. (Patient not taking: Reported on 01/25/2021)    . metoCLOPramide (REGLAN) 5 MG tablet Take 5 mg by mouth 3 (three) times daily as needed. (Patient not taking: Reported on 01/25/2021)    . mometasone (NASONEX) 50 MCG/ACT nasal spray 2 sprays as needed. (Patient not taking: Reported on 01/25/2021)     No current facility-administered medications for this visit.    ROS:   General:  No weight loss, Fever, chills  HEENT: No recent headaches, no nasal bleeding, no visual changes, no sore throat  Neurologic: No dizziness, blackouts, seizures. No recent symptoms of stroke or mini- stroke. No recent episodes of slurred speech, or temporary blindness.  Cardiac: No recent episodes of chest pain/pressure, no shortness of breath at rest.  No shortness of breath with exertion.  Denies history of atrial fibrillation or irregular heartbeat  Vascular: No history of rest pain in feet.  No history of claudication.  No history of non-healing ulcer, No history of DVT   Pulmonary: No home oxygen, no productive cough, no hemoptysis,  No asthma or wheezing  Musculoskeletal:  [X]  Arthritis, [X]  Low back pain,  [X]  Joint pain  Hematologic:No history of hypercoagulable state.  No history of easy bleeding.  No history of anemia  Gastrointestinal: No hematochezia or melena,  No gastroesophageal reflux, no trouble swallowing  Urinary: [ ]  chronic Kidney disease, [ ]  on HD - [ ]  MWF or [ ]  TTHS, [ ]  Burning with urination, [ ]  Frequent urination, [ ]  Difficulty urinating;   Skin: No rashes  Psychological: No history of anxiety,  No history of depression   Physical Examination  Vitals:   01/25/21 0858  BP: 126/79  Pulse: 91  Resp: 20  Temp: 98.2 F (36.8 C)  SpO2: 97%  Weight: 185 lb (83.9 kg)  Height: 5\' 4"  (1.626 m)    Body mass index is 31.76 kg/m.  General:  Alert and oriented, no acute distress HEENT: Normal Neck: No  JVD Cardiac: Regular Rate and Rhythm Abdomen: Soft, non-tender, non-distended, no mass, no scars Skin: No rash Extremity Pulses:  2+ radial, brachial, femoral, dorsalis pedis, posterior tibial pulses bilaterally Musculoskeletal: No deformity right lower extremity pitting edema extending from the knee down to the foot  Neurologic: Upper and lower extremity motor 5/5 and symmetric  DATA:  Patient had a DVT ultrasound on November 15, 2020.  This showed no evidence of DVT.  He subsequently had a venous duplex for reflux performed on November 24, 2020.  This showed mild reflux in the right common femoral vein and at the right saphenofemoral junction.  However greater saphenous vein diameter was less than 4 mm throughout its course.  ASSESSMENT: Mild venous reflux right leg probably contributing to his right leg swelling symptoms.  Vein diameter is not large enough to consider laser ablation.  If his edema were to worsen over time would consider CT scan of the abdomen and pelvis to make sure he does not have lymphatic obstruction.  Otherwise he will wear a 20 to 30 mm compression garment knee-high length for now.   PLAN: We will follow up with [place] on an as-needed basis.  Ruta Hinds, MD [facility] Office: [phone]

## 2021-03-19 ENCOUNTER — Other Ambulatory Visit: Payer: Self-pay | Admitting: Internal Medicine

## 2021-03-20 LAB — CBC
HCT: 42.1 % (ref 38.5–50.0)
Hemoglobin: 13.1 g/dL — ABNORMAL LOW (ref 13.2–17.1)
MCH: 27.8 pg (ref 27.0–33.0)
MCHC: 31.1 g/dL — ABNORMAL LOW (ref 32.0–36.0)
MCV: 89.2 fL (ref 80.0–100.0)
MPV: 11.2 fL (ref 7.5–12.5)
Platelets: 178 10*3/uL (ref 140–400)
RBC: 4.72 10*6/uL (ref 4.20–5.80)
RDW: 13 % (ref 11.0–15.0)
WBC: 6.4 10*3/uL (ref 3.8–10.8)

## 2021-03-20 LAB — COMPLETE METABOLIC PANEL WITH GFR
AG Ratio: 1.6 (calc) (ref 1.0–2.5)
ALT: 33 U/L (ref 9–46)
AST: 25 U/L (ref 10–35)
Albumin: 4.3 g/dL (ref 3.6–5.1)
Alkaline phosphatase (APISO): 121 U/L (ref 35–144)
BUN/Creatinine Ratio: 22 (calc) (ref 6–22)
BUN: 33 mg/dL — ABNORMAL HIGH (ref 7–25)
CO2: 24 mmol/L (ref 20–32)
Calcium: 9.3 mg/dL (ref 8.6–10.3)
Chloride: 104 mmol/L (ref 98–110)
Creat: 1.5 mg/dL — ABNORMAL HIGH (ref 0.70–1.18)
GFR, Est African American: 53 mL/min/{1.73_m2} — ABNORMAL LOW (ref >=60)
GFR, Est Non African American: 46 mL/min/{1.73_m2} — ABNORMAL LOW (ref >=60)
Globulin: 2.7 g/dL (calc) (ref 1.9–3.7)
Glucose, Bld: 133 mg/dL — ABNORMAL HIGH (ref 65–99)
Potassium: 5.2 mmol/L (ref 3.5–5.3)
Sodium: 140 mmol/L (ref 135–146)
Total Bilirubin: 0.3 mg/dL (ref 0.2–1.2)
Total Protein: 7 g/dL (ref 6.1–8.1)

## 2021-03-20 LAB — LIPID PANEL
Cholesterol: 172 mg/dL (ref <200)
HDL: 53 mg/dL (ref >=40)
LDL Cholesterol (Calc): 83 mg/dL (calc)
Non-HDL Cholesterol (Calc): 119 mg/dL (calc) (ref <130)
Total CHOL/HDL Ratio: 3.2 (calc) (ref <5.0)
Triglycerides: 300 mg/dL — ABNORMAL HIGH (ref <150)

## 2021-03-20 LAB — TSH: TSH: 1.96 mIU/L (ref 0.40–4.50)

## 2021-09-24 ENCOUNTER — Telehealth: Payer: Self-pay

## 2021-09-24 ENCOUNTER — Encounter: Payer: Self-pay | Admitting: *Deleted

## 2021-09-24 NOTE — Telephone Encounter (Signed)
NOTES SCANNED TO REFERRAL RJ 

## 2021-10-08 NOTE — Progress Notes (Signed)
Cardiology Office Note:    Date:  10/09/2021   ID:  Jason Salazar, DOB [birth year], MRN [id number]  PCP:  Nolene Ebbs, MD   [facility] Providers Cardiologist:  Sinclair Grooms, MD    Referring MD: Nolene Ebbs, MD   Chief Complaint:  Follow-up for hypertension    Patient Profile:   Jason Salazar is a 75 y.o. 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 Tamala Julian in 9/21.  He returns for f/u.  He is here alone.  Overall, he has been doing well.  He has not had chest pain, shortness of breath, syncope, orthopnea, leg edema.  He does have neuropathy as well as some lumbar disc disease.  He has some difficulty with balance and has fallen at least once in the last 3 months.      ASSESSMENT & PLAN:   Hypertensive heart disease Blood pressure is well controlled.  Continue clonidine 0.1 mg daily, lisinopril/HCTZ 20/12.5 mg daily.  Follow-up with Dr. Tamala Julian in 1 year.  AV block, Mobitz 1 No evidence of first or secondary heart block on electrocardiogram today.  No symptoms to suggest high-grade heart block.  Avoid AV nodal blocking agents.  Diabetes Managed by primary care.  Stage 3a chronic kidney disease ([place]) Labs from primary care received.  08/02/2021: K+ 5, BUN 31, creatinine 1.3.  Obtain follow-up BMET today.  Hypercholesteremia Recent LDL 83.  Triglycerides 300.  This is managed by primary care.  I have asked him to limit simple carbohydrates to reduce triglycerides.  Sinus tachycardia Etiology not entirely clear.  He is not symptomatic.  He has not had any recent illnesses, etc.  I will obtain a BMET, CBC and TSH today.  If these are normal, follow-up with primary care.           Dispo:  Return in about 1 year (around 10/09/2022) for Routine follow up in 1 year with Dr.Smith. .    Prior CV  studies: Cardiac event monitor 09/2020 NSR with occasional first-degree AV block Occasional Mobitz 1 during sleep No arrhythmia noted with complaints  Echocardiogram 08/23/2020 EF 70-75, no RWMA, moderate LVH, GR 1 DD, normal RVSF, trivial MR, cannot rule out ASD/Patent Foramen Ovale  Carotid US 02/23/2014 Bilateral ICA 1-39     Past Medical History:  Diagnosis Date   Allergic rhinitis    Balantidiasis    Cancer ([place])    prostate cancer treated 2007 with radiation seed placement   Diabetes ([place])    H/O prostate cancer    Radioactive seeds   Hypercholesteremia    Hyperlipidemia    Hyperlipoproteinemia    Hypertension    Sleep apnea    Current Medications: Current Meds  Medication Sig   allopurinol (ZYLOPRIM) 100 MG tablet Take 100 mg by mouth daily.    aspirin EC 81 MG tablet Take 81 mg by mouth daily.   b complex vitamins tablet Take 1 tablet by mouth daily.   brinzolamide (AZOPT) 1 % ophthalmic suspension Azopt 1 % eye drops,suspension   cloNIDine (CATAPRES) 0.1 MG tablet Take 0.1 mg by mouth at bedtime.   colchicine 0.6 MG tablet Take 0.6 mg by mouth as needed (for gout).   diclofenac sodium (VOLTAREN) 1 % GEL Apply 2 g topically as needed.    DILANTIN 100 MG ER  capsule Take 4 capsules (400 mg total) by mouth at bedtime.   fluticasone (FLONASE) 50 MCG/ACT nasal spray Place 1 spray into both nostrils as needed.    furosemide (LASIX) 20 MG tablet Take 20 mg by mouth as needed for fluid or edema.   gabapentin (NEURONTIN) 100 MG capsule Take 100 mg by mouth at bedtime.   HYDROcodone bit-homatropine (HYCODAN) 5-1.5 MG/5ML syrup Take 5 mLs by mouth every 4 (four) hours as needed for cough.   Lancets (ONETOUCH DELICA PLUS [id number]) MISC Apply topically daily.   lisinopril-hydrochlorothiazide (PRINZIDE,ZESTORETIC) 20-12.5 MG tablet Take 1 tablet by mouth daily.   Loratadine 10 MG CHEW Chew by mouth.   metoCLOPramide (REGLAN) 5 MG tablet Take 5 mg by mouth 3 (three) times daily  as needed.   mometasone (NASONEX) 50 MCG/ACT nasal spray 2 sprays as needed.   Multiple Vitamins-Minerals (ONE-A-DAY MENS 50+ ADVANTAGE PO) Take 1 tablet by mouth daily.   ONETOUCH VERIO test strip SMARTSIG:Strip(s) Via Meter Daily   rosuvastatin (CRESTOR) 10 MG tablet Take 10 mg by mouth daily.   SYNJARDY XR 09-999 MG TB24 Take 1 tablet by mouth daily.   TRAVATAN Z 0.004 % SOLN ophthalmic solution Place 1 drop into both eyes at bedtime.     Allergies:   Atorvastatin   Social History   Tobacco Use   Smoking status: Never   Smokeless tobacco: Never  Vaping Use   Vaping Use: Never used  Substance Use Topics   Alcohol use: No   Drug use: No    Family Hx: The patient's family history includes CVA in his mother; Prostate cancer in his father; Sleep apnea in his brother; Thyroid cancer in his father.  Review of Systems  Constitutional: Negative for fever.  Respiratory:  Negative for cough.   Gastrointestinal:  Negative for diarrhea and hematochezia.  Genitourinary:  Negative for hematuria.    EKGs/Labs/Other Test Reviewed:    EKG:  EKG is  ordered today.  The ekg ordered today demonstrates sinus tachycardia, HR 106, normal axis, nonspecific ST-T wave changes, QTC 414, PR 192  Recent Labs: 03/19/2021: ALT 33; BUN 33; Creat 1.50; Hemoglobin 13.1; Platelets 178; Potassium 5.2; Sodium 140; TSH 1.96   Recent Lipid Panel Lab Results  Component Value Date/Time   CHOL 172 03/19/2021 09:13 AM   TRIG 300 (H) 03/19/2021 09:13 AM   HDL 53 03/19/2021 09:13 AM   LDLCALC 83 03/19/2021 09:13 AM     Risk Assessment/Calculations:          Physical Exam:    VS:  BP 120/60   Pulse (!) 106   Ht 5\' 4"  (1.626 m)   Wt 186 lb (84.4 kg)   SpO2 97%   BMI 31.93 kg/m     Wt Readings from Last 3 Encounters:  10/09/21 186 lb (84.4 kg)  01/25/21 185 lb (83.9 kg)  11/01/20 189 lb (85.7 kg)    Constitutional:      Appearance: Healthy appearance. Not in distress.  Neck:     Vascular: JVD  normal.  Pulmonary:     Effort: Pulmonary effort is normal.     Breath sounds: No wheezing. No rales.  Cardiovascular:     Tachycardia present. Regular rhythm. Normal S1. Normal S2.      Murmurs: There is no murmur.  Edema:    Peripheral edema absent.  Abdominal:     Palpations: Abdomen is soft.  Skin:    General: Skin is warm and dry.  Neurological:  General: No focal deficit present.     Mental Status: Alert and oriented to person, place and time.     Cranial Nerves: Cranial nerves are intact.       Medication Adjustments/Labs and Tests Ordered: Current medicines are reviewed at length with the patient today.  Concerns regarding medicines are outlined above.  Tests Ordered: Orders Placed This Encounter  Procedures   Basic metabolic panel   CBC   TSH   EKG 12-Lead    Medication Changes: No orders of the defined types were placed in this encounter.  Signed, Richardson Dopp, PA-C  10/09/2021 2:32 PM    [facility] [place], [place], [place]  [address] Phone: [phone]; Fax: [phone]

## 2021-10-09 ENCOUNTER — Encounter: Payer: Self-pay | Admitting: Physician Assistant

## 2021-10-09 ENCOUNTER — Ambulatory Visit: Payer: Medicare Other | Admitting: Physician Assistant

## 2021-10-09 ENCOUNTER — Other Ambulatory Visit: Payer: Self-pay

## 2021-10-09 VITALS — BP 120/60 | HR 106 | Ht 64.0 in | Wt 186.0 lb

## 2021-10-09 DIAGNOSIS — N1831 Chronic kidney disease, stage 3a: Secondary | ICD-10-CM | POA: Insufficient documentation

## 2021-10-09 DIAGNOSIS — E7800 Pure hypercholesterolemia, unspecified: Secondary | ICD-10-CM

## 2021-10-09 DIAGNOSIS — I119 Hypertensive heart disease without heart failure: Secondary | ICD-10-CM

## 2021-10-09 DIAGNOSIS — I441 Atrioventricular block, second degree: Secondary | ICD-10-CM | POA: Diagnosis not present

## 2021-10-09 DIAGNOSIS — R000 Tachycardia, unspecified: Secondary | ICD-10-CM | POA: Diagnosis not present

## 2021-10-09 DIAGNOSIS — E1169 Type 2 diabetes mellitus with other specified complication: Secondary | ICD-10-CM

## 2021-10-09 NOTE — Assessment & Plan Note (Signed)
No evidence of first or secondary heart block on electrocardiogram today.  No symptoms to suggest high-grade heart block.  Avoid AV nodal blocking agents.

## 2021-10-09 NOTE — Assessment & Plan Note (Signed)
Labs from primary care received.  08/02/2021: K+ 5, BUN 31, creatinine 1.3.  Obtain follow-up BMET today.

## 2021-10-09 NOTE — Patient Instructions (Signed)
Medication Instructions:   Your physician recommends that you continue on your current medications as directed. Please refer to the Current Medication list given to you today.  *If you need a refill on your cardiac medications before your next appointment, please call your pharmacy*   Lab Work:  TODAY!!!!!!! BMET/CBC/TSH  If you have labs (blood work) drawn today and your tests are completely normal, you will receive your results only by: [place] (if you have MyChart) OR A paper copy in the mail If you have any lab test that is abnormal or we need to change your treatment, we will call you to review the results.   Testing/Procedures:  -NONE-   Follow-Up: At [facility], you and your health needs are our priority.  As part of our continuing mission to provide you with exceptional heart care, we have created designated Provider Care Teams.  These Care Teams include your primary Cardiologist (physician) and Advanced Practice Providers (APPs -  Physician Assistants and Nurse Practitioners) who all work together to provide you with the care you need, when you need it.  We recommend signing up for the patient portal called "MyChart".  Sign up information is provided on this After Visit Summary.  MyChart is used to connect with patients for Virtual Visits (Telemedicine).  Patients are able to view lab/test results, encounter notes, upcoming appointments, etc.  Non-urgent messages can be sent to your provider as well.   To learn more about what you can do with MyChart, go to [email or web address].    Your next appointment:   1 year(s)  The format for your next appointment:   In Person  Provider:   Daneen Schick, MD   Other Instructions  Your physician wants you to follow-up in: 1 year with Dr. Tamala Julian.  You will receive a reminder letter in the mail two months in advance. If you don't receive a letter, please call our office to schedule the follow-up appointment.

## 2021-10-09 NOTE — Progress Notes (Deleted)
10/10/21- 74 yoM never smoker for sleep evaluation courtesy of [facility] problem list includes HTN, OSA, Allergic Rhinitis, DM, Epilepsy, CKD3,  hx Prostate Cancer/ seeds, Hypercholesterolemia, Glaucoma,  NPSG 01/31/13- AHI 123.3/ hr, desaturation to 81%, CPAP to 13, Body weight 210 lbs When last seen in 2015 was using CPAP 13 from [place]. CPAP? Download? Body weight today- Covid vax- Flu vax-

## 2021-10-09 NOTE — Assessment & Plan Note (Signed)
Managed by primary care. 

## 2021-10-09 NOTE — Assessment & Plan Note (Signed)
Blood pressure is well controlled.  Continue clonidine 0.1 mg daily, lisinopril/HCTZ 20/12.5 mg daily.  Follow-up with Dr. Tamala Julian in 1 year.

## 2021-10-09 NOTE — Assessment & Plan Note (Signed)
Etiology not entirely clear.  He is not symptomatic.  He has not had any recent illnesses, etc.  I will obtain a BMET, CBC and TSH today.  If these are normal, follow-up with primary care.

## 2021-10-09 NOTE — Assessment & Plan Note (Signed)
Recent LDL 83.  Triglycerides 300.  This is managed by primary care.  I have asked him to limit simple carbohydrates to reduce triglycerides.

## 2021-10-10 ENCOUNTER — Institutional Professional Consult (permissible substitution): Payer: Medicare Other | Admitting: Internal Medicine
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
## 2021-10-11 ENCOUNTER — Other Ambulatory Visit: Payer: Self-pay

## 2021-10-11 ENCOUNTER — Ambulatory Visit (INDEPENDENT_AMBULATORY_CARE_PROVIDER_SITE_OTHER): Payer: Medicare Other | Admitting: Internal Medicine

## 2021-10-11 ENCOUNTER — Encounter: Payer: Self-pay | Admitting: Internal Medicine

## 2021-10-11 DIAGNOSIS — Z72821 Inadequate sleep hygiene: Secondary | ICD-10-CM

## 2021-10-11 DIAGNOSIS — G4733 Obstructive sleep apnea (adult) (pediatric): Secondary | ICD-10-CM

## 2021-10-11 NOTE — Patient Instructions (Addendum)
We are working to get a download from your CPAP machine so we can see if setting changes are needed.  Please do what you can to get the sleep time you need.  Call if we can help

## 2021-10-12 ENCOUNTER — Telehealth: Payer: Self-pay | Admitting: Physician Assistant

## 2021-10-12 LAB — BASIC METABOLIC PANEL
BUN/Creatinine Ratio: 23 (ref 10–24)
BUN: 39 mg/dL — ABNORMAL HIGH (ref 8–27)
CO2: 16 mmol/L — ABNORMAL LOW (ref 20–29)
Calcium: 9.1 mg/dL (ref 8.6–10.2)
Chloride: 100 mmol/L (ref 96–106)
Creatinine, Ser: 1.7 mg/dL — ABNORMAL HIGH (ref 0.76–1.27)
Glucose: 332 mg/dL — ABNORMAL HIGH (ref 70–99)
Potassium: 4.8 mmol/L (ref 3.5–5.2)
Sodium: 133 mmol/L — ABNORMAL LOW (ref 134–144)
eGFR: 42 mL/min/{1.73_m2} — ABNORMAL LOW (ref >59)

## 2021-10-12 LAB — CBC
Hematocrit: 42.2 % (ref 37.5–51.0)
Hemoglobin: 13.7 g/dL (ref 13.0–17.7)
MCH: 28.2 pg (ref 26.6–33.0)
MCHC: 32.5 g/dL (ref 31.5–35.7)
MCV: 87 fL (ref 79–97)
Platelets: 135 10*3/uL — ABNORMAL LOW (ref 150–450)
RBC: 4.85 x10E6/uL (ref 4.14–5.80)
RDW: 13.1 % (ref 11.6–15.4)
WBC: 4.8 10*3/uL (ref 3.4–10.8)

## 2021-10-12 LAB — TSH: TSH: 1.53 u[IU]/mL (ref 0.450–4.500)

## 2021-10-12 NOTE — Telephone Encounter (Signed)
Pt is returning call for results 

## 2021-10-26 ENCOUNTER — Other Ambulatory Visit (HOSPITAL_BASED_OUTPATIENT_CLINIC_OR_DEPARTMENT_OTHER): Payer: Self-pay

## 2021-10-26 ENCOUNTER — Ambulatory Visit: Payer: Medicare Other | Attending: Internal Medicine

## 2021-10-26 DIAGNOSIS — Z23 Encounter for immunization: Secondary | ICD-10-CM

## 2021-10-26 MED ORDER — GABAPENTIN 100 MG PO CAPS
ORAL_CAPSULE | ORAL | 2 refills | Status: DC
  Filled 2021-10-26: qty 90, 90d supply, fill #0

## 2021-10-26 MED ORDER — COLCHICINE 0.6 MG PO TABS: ORAL_TABLET | ORAL | 1 refills | Status: DC

## 2021-10-26 MED ORDER — ONETOUCH DELICA PLUS LANCET33G MISC
5 refills | Status: DC
  Filled 2021-10-26: qty 100, 90d supply, fill #0

## 2021-10-26 MED ORDER — PFIZER COVID-19 VAC BIVALENT 30 MCG/0.3ML IM SUSP
INTRAMUSCULAR | 0 refills | Status: DC
  Filled 2021-10-26: qty 0.3, 1d supply, fill #0

## 2021-10-26 MED ORDER — BRINZOLAMIDE 1 % OP SUSP: OPHTHALMIC | 6 refills | Status: DC

## 2021-10-26 MED ORDER — GLUCOSE BLOOD VI STRP
ORAL_STRIP | 5 refills | Status: DC
  Filled 2021-10-26 – 2022-01-16 (×2): qty 100, 90d supply, fill #0

## 2021-10-26 MED ORDER — SYNJARDY XR 10-1000 MG PO TB24
ORAL_TABLET | ORAL | 2 refills | Status: DC
  Filled 2021-10-26 – 2021-12-25 (×2): qty 30, 30d supply, fill #0

## 2021-10-26 NOTE — Progress Notes (Signed)
   Covid-19 Vaccination Clinic  Name:  Jason Salazar    MRN: [id number] DOB: May 22, 1946  10/26/2021  [name] was observed post Covid-19 immunization for 15 minutes without incident. He was provided with Vaccine Information Sheet and instruction to access the V-Safe system.   [name] was instructed to call 911 with any severe reactions post vaccine: Difficulty breathing  Swelling of face and throat  A fast heartbeat  A bad rash all over body  Dizziness and weakness   Immunizations Administered     Name Date Dose VIS Date Route   Pfizer Covid-19 Vaccine Bivalent Booster 10/26/2021  1:21 PM 0.3 mL 08/08/2021 Intramuscular   Manufacturer: [place]   Lot: [id number]   [place]: [phone]

## 2021-10-29 ENCOUNTER — Other Ambulatory Visit (HOSPITAL_BASED_OUTPATIENT_CLINIC_OR_DEPARTMENT_OTHER): Payer: Self-pay

## 2021-10-29 NOTE — Progress Notes (Signed)
Chief Complaint  Patient presents with   Follow-up    Rm 1, alone. Here for yearly SZ f/u. Pt reports no sz since last OV. 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 Started 3 years and worsened overtime.     HISTORY OF PRESENT ILLNESS: 10/30/21 ALL: Jason Salazar returns for follow up for seizures. He continues to do well on phenytoin 400mg  at bedtime. He is tolerating meds well. No seizures. Last seizure 2010. He is on a multiple vitamin. He is seeing Dr Rolena Infante for lumbar back pain. Last creatinine 1.7. he is followed closely by PCP.   11/01/2020 ALL:  Jason Salazar is a 75 y.o. male here today for follow up for seizures. He continues phenytoin and tolerating well. No seizures. He is followed closely by PCP. Recent labs were normal. Patient reports creatinine now normal. Phenytoin level not checked.   HISTORY (copied from previous note)  UPDATE (11/02/19, VRP): Since last visit, doing well. Symptoms are stable. No seizures. No alleviating or aggravating factors. Tolerating dilantin. Last seizure ~ 2010 or earlier.    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  Seizure disorder beginning at the age of 75 following a head injury.  No seizures in several years now.  He is currently on brand Dilantin without side effects.  No balance issues no falls no daytime drowsiness.  He has a history of obstructive sleep apnea and uses CPAP.  He is retired.  He returns for reevaluation he needs labs and refills   UPDATE (10/29/17, CM) Jason Salazar, 75year-old black male returns for  yearly followup. 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 No seizures in several years. He denies any missed doses of his medication. His seizure disorder began at age 50 following a head injury. He denies any  side effects to his Dilantin, (BRAND). He denies any falls. He denies any daytime drowsiness. He also is diabetic but says his blood sugars are in good control. [name]  He gets no regular exercise. He has had some numbness in his feet no pain. He also has a history of high blood pressure and sleep disorder for which he uses CPAP at 13 cm of pressure. He is  Retired. He returns for reevaluation, labs and refills.   REVIEW OF SYSTEMS: Out of a complete 14 system review of symptoms, the patient complains only of the following symptoms, lower extremity edema, low back pain and all other reviewed systems are negative.   ALLERGIES: Allergies  Allergen Reactions   Atorvastatin     Other reaction(s): lightheaded     HOME MEDICATIONS: Outpatient Medications Prior to Visit  Medication Sig Dispense Refill   allopurinol (ZYLOPRIM) 100 MG tablet Take 100 mg by mouth daily.      aspirin EC 81 MG tablet Take 81 mg by mouth daily.     b complex vitamins tablet Take 1 tablet by mouth daily.     brinzolamide (AZOPT) 1 % ophthalmic suspension Azopt 1 % eye drops,suspension     diclofenac sodium (VOLTAREN) 1 % GEL Apply 2 g topically as needed.      Empagliflozin-metFORMIN HCl ER (SYNJARDY XR) 09-999 MG TB24 Take 1 tablet every day 30 tablet 2   fluticasone (FLONASE) 50 MCG/ACT nasal spray Place 1 spray into both nostrils as needed.  gabapentin (NEURONTIN) 100 MG capsule Take 1 capsule by  mouth everyday at bedtime. 90 capsule 2   glucose blood test strip Use for once daily glucose testing (Dx: E11.9) 100 each 5   HYDROcodone bit-homatropine (HYCODAN) 5-1.5 MG/5ML syrup Take 5 mLs by mouth every 4 (four) hours as needed for cough.     Lancets (ONETOUCH DELICA PLUS [id number]) MISC Use for once daily glucose testing (Dx: E11.9) 100 each 5   lisinopril-hydrochlorothiazide (PRINZIDE,ZESTORETIC) 20-12.5 MG tablet Take 1 tablet by mouth daily. 7 tablet 0   metoCLOPramide (REGLAN) 5 MG tablet Take 5 mg by mouth 3  (three) times daily as needed.     mometasone (NASONEX) 50 MCG/ACT nasal spray 2 sprays as needed.     Multiple Vitamins-Minerals (ONE-A-DAY MENS 50+ ADVANTAGE PO) Take 1 tablet by mouth daily.     ONETOUCH VERIO test strip SMARTSIG:Strip(s) Via Meter Daily     rosuvastatin (CRESTOR) 10 MG tablet Take 10 mg by mouth daily.     brinzolamide (AZOPT) 1 % ophthalmic suspension Place 1 drop into both eyes three times daily. 10 mL 6   cloNIDine (CATAPRES) 0.1 MG tablet Take 0.1 mg by mouth at bedtime.     colchicine 0.6 MG tablet Take 0.6 mg by mouth as needed (for gout).     colchicine 0.6 MG tablet Take 1 tablet by mouth twice daily as needed. 180 tablet 1   COVID-19 mRNA bivalent vaccine, Pfizer, (PFIZER COVID-19 VAC BIVALENT) injection Inject into the muscle. 0.3 mL 0   DILANTIN 100 MG ER capsule Take 4 capsules (400 mg total) by mouth at bedtime. 360 capsule 4   furosemide (LASIX) 20 MG tablet Take 20 mg by mouth as needed for fluid or edema.     gabapentin (NEURONTIN) 100 MG capsule Take 100 mg by mouth at bedtime.     Lancets (ONETOUCH DELICA PLUS [id number]) MISC Apply topically daily.     Loratadine 10 MG CHEW Chew by mouth.     SYNJARDY XR 09-999 MG TB24 Take 1 tablet by mouth daily.     TRAVATAN Z 0.004 % SOLN ophthalmic solution Place 1 drop into both eyes at bedtime.      No facility-administered medications prior to visit.     PAST MEDICAL HISTORY: Past Medical History:  Diagnosis Date   Allergic rhinitis    Balantidiasis    Cancer ([place])    prostate cancer treated 2007 with radiation seed placement   Diabetes ([place])    H/O prostate cancer    Radioactive seeds   Hypercholesteremia    Hyperlipidemia    Hyperlipoproteinemia    Hypertension    Sleep apnea      PAST SURGICAL HISTORY: Past Surgical History:  Procedure Laterality Date   cyst removed from right kidney       FAMILY HISTORY: Family History  Problem Relation Age of Onset   CVA Mother    Sleep apnea  Brother    Thyroid cancer Father    Prostate cancer Father      SOCIAL HISTORY: Social History   Socioeconomic History   Marital status: Married    Spouse name: [name]   Number of children: 1   Years of education: HS   Highest education level: Not on file  Occupational History    Employer: [employer]  Tobacco Use   Smoking status: Never   Smokeless tobacco: Never  Vaping Use   Vaping Use: Never used  Substance and Sexual Activity   Alcohol  use: No   Drug use: No   Sexual activity: Not on file  Other Topics Concern   Not on file  Social History Narrative   Patient is married to [place] and has one son.   Caffeine Use: none; quit a few months ago             Social Determinants of Radio broadcast assistant Strain: Not on file  Food Insecurity: Not on file  Transportation Needs: Not on file  Physical Activity: Not on file  Stress: Not on file  Social Connections: Not on file  Intimate Partner Violence: Not on file      PHYSICAL EXAM  Vitals:   10/30/21 0936  BP: 123/60  Pulse: 80  Weight: 181 lb (82.1 kg)  Height: 5\' 4"  (1.626 m)    Body mass index is 31.07 kg/m.   Generalized: Well developed, in no acute distress  Cardiology: normal rate and rhythm, no murmur auscultated  Respiratory: clear to auscultation bilaterally    Neurological examination  Mentation: Alert oriented to time, place, history taking. Follows all commands speech and language fluent Cranial nerve II-XII: Pupils were equal round reactive to light. Extraocular movements were full, visual field were full on confrontational test. Facial sensation and strength were normal. Head turning and shoulder shrug  were normal and symmetric. Motor: The motor testing reveals 5 over 5 strength of all 4 extremities. Good symmetric motor tone is noted throughout.  Sensory: Sensory testing is intact to soft touch on all 4 extremities. No evidence of extinction is noted.  Coordination:  Cerebellar testing reveals good finger-nose-finger and heel-to-shin bilaterally.  Gait and station: Gait is normal.  Reflexes: Deep tendon reflexes are symmetric and normal bilaterally.     DIAGNOSTIC DATA (LABS, IMAGING, TESTING) - I reviewed patient records, labs, notes, testing and imaging myself where available.  Lab Results  Component Value Date   WBC 4.8 10/09/2021   HGB 13.7 10/09/2021   HCT 42.2 10/09/2021   MCV 87 10/09/2021   PLT 135 (L) 10/09/2021      Component Value Date/Time   NA 133 (L) 10/09/2021 1422   K 4.8 10/09/2021 1422   CL 100 10/09/2021 1422   CO2 16 (L) 10/09/2021 1422   GLUCOSE 332 (H) 10/09/2021 1422   GLUCOSE 133 (H) 03/19/2021 0913   BUN 39 (H) 10/09/2021 1422   CREATININE 1.70 (H) 10/09/2021 1422   CREATININE 1.50 (H) 03/19/2021 0913   CALCIUM 9.1 10/09/2021 1422   PROT 7.0 03/19/2021 0913   PROT 6.4 10/29/2018 1044   ALBUMIN 4.3 10/29/2018 1044   AST 25 03/19/2021 0913   ALT 33 03/19/2021 0913   ALKPHOS 129 (H) 10/29/2018 1044   BILITOT 0.3 03/19/2021 0913   BILITOT <0.2 10/29/2018 1044   GFRNONAA 46 (L) 03/19/2021 0913   GFRAA 53 (L) 03/19/2021 0913   Lab Results  Component Value Date   CHOL 172 03/19/2021   HDL 53 03/19/2021   LDLCALC 83 03/19/2021   TRIG 300 (H) 03/19/2021   CHOLHDL 3.2 03/19/2021   No results found for: HGBA1C No results found for: VITAMINB12 Lab Results  Component Value Date   TSH 1.530 10/09/2021      ASSESSMENT AND PLAN  75 y.o. year old male  has a past medical history of Allergic rhinitis, Balantidiasis, Cancer ([place]), Diabetes ([place]), H/O prostate cancer, Hypercholesteremia, Hyperlipidemia, Hyperlipoproteinemia, Hypertension, and Sleep apnea. here with   Generalized convulsive epilepsy ([place]) - Plan: Phenytoin Level, Total, DILANTIN 100  MG ER capsule  He is doing well. We will continue phenytoin 400mg  daily. I will update labs today. Consider DEXA screening. He will discuss with PCP. Continue vitamin  D and calcium supplements. Continue close follow up with Dr Rolena Infante for pain management. Healthy lifestyle habits encouraged. Seizure precautions advised. He will follow up annually, sooner if needed.   Debbora Presto, MSN, FNP-C 10/30/2021, 10:05 AM  [facility] [address] [place], [place] [address] [phone]

## 2021-10-29 NOTE — Patient Instructions (Addendum)
Below is our plan:  We will continue phenytoin 400mg  at bedtime. We will update labs. Please continue vitamin D and calcium supplement. Check in with Dr Jeanie Cooks as directed for annual physical with DEXA as recommended.   Please make sure you are staying well hydrated. I recommend 50-60 ounces daily. Well balanced diet and regular exercise encouraged. Consistent sleep schedule with 6-8 hours recommended.   Please continue follow up with care team as directed.   Follow up with me in 1 year   You may receive a survey regarding today's visit. I encourage you to leave honest feed back as I do use this information to improve patient care. Thank you for seeing me today!

## 2021-10-30 ENCOUNTER — Encounter: Payer: Self-pay | Admitting: Internal Medicine

## 2021-10-30 ENCOUNTER — Other Ambulatory Visit (HOSPITAL_BASED_OUTPATIENT_CLINIC_OR_DEPARTMENT_OTHER): Payer: Self-pay

## 2021-10-30 ENCOUNTER — Encounter: Payer: Self-pay | Admitting: Family Medicine

## 2021-10-30 ENCOUNTER — Ambulatory Visit: Payer: Medicare Other | Admitting: Family Medicine

## 2021-10-30 ENCOUNTER — Other Ambulatory Visit: Payer: Self-pay

## 2021-10-30 VITALS — BP 123/60 | HR 80 | Ht 64.0 in | Wt 181.0 lb

## 2021-10-30 DIAGNOSIS — G40309 Generalized idiopathic epilepsy and epileptic syndromes, not intractable, without status epilepticus: Secondary | ICD-10-CM

## 2021-10-30 DIAGNOSIS — Z72821 Inadequate sleep hygiene: Secondary | ICD-10-CM | POA: Insufficient documentation

## 2021-10-30 MED ORDER — DILANTIN 100 MG PO CAPS
400.0000 mg | ORAL_CAPSULE | Freq: Every day | ORAL | 4 refills | Status: DC
  Filled 2021-10-30 – 2021-11-13 (×3): qty 360, 90d supply, fill #0
  Filled 2022-02-04 (×2): qty 360, 90d supply, fill #1
  Filled 2022-05-03: qty 360, 90d supply, fill #2
  Filled 2022-08-30: qty 360, 90d supply, fill #3

## 2021-10-30 NOTE — Assessment & Plan Note (Signed)
CPAP works well and settings are appropriate. Plan-work on meeting compliance goals routinely.

## 2021-10-30 NOTE — Assessment & Plan Note (Signed)
He is sacrificing nighttime sleep to provide transportation so his son can get to work.  We discussed this and I asked him to look for other options so that he can get undisturbed sleep in a range of 6 to 8 hours at least.

## 2021-10-31 ENCOUNTER — Other Ambulatory Visit (HOSPITAL_BASED_OUTPATIENT_CLINIC_OR_DEPARTMENT_OTHER): Payer: Self-pay

## 2021-10-31 LAB — PHENYTOIN LEVEL, TOTAL: Phenytoin (Dilantin), Serum: 25.9 ug/mL (ref 10.0–20.0)

## 2021-11-06 ENCOUNTER — Other Ambulatory Visit (HOSPITAL_COMMUNITY): Payer: Self-pay

## 2021-11-06 MED ORDER — DICLOFENAC SODIUM 1 % EX GEL: 4.0000 g | Freq: Four times a day (QID) | CUTANEOUS | 5 refills | Status: DC | PRN

## 2021-11-06 MED ORDER — GABAPENTIN 300 MG PO CAPS
300.0000 mg | ORAL_CAPSULE | Freq: Every day | ORAL | 2 refills | Status: DC
  Filled 2021-11-06 – 2021-11-13 (×2): qty 90, 90d supply, fill #0
  Filled 2022-02-04: qty 90, 90d supply, fill #1

## 2021-11-07 ENCOUNTER — Other Ambulatory Visit (HOSPITAL_BASED_OUTPATIENT_CLINIC_OR_DEPARTMENT_OTHER): Payer: Self-pay

## 2021-11-13 ENCOUNTER — Other Ambulatory Visit (HOSPITAL_BASED_OUTPATIENT_CLINIC_OR_DEPARTMENT_OTHER): Payer: Self-pay

## 2021-11-21 ENCOUNTER — Telehealth: Payer: Self-pay

## 2021-11-21 NOTE — Telephone Encounter (Signed)
Lmom to schedule appt .  (2nd call)   11/16/21 1043a lmom to call back to schedule appt from email request (1st call)

## 2021-11-26 ENCOUNTER — Other Ambulatory Visit (HOSPITAL_BASED_OUTPATIENT_CLINIC_OR_DEPARTMENT_OTHER): Payer: Self-pay

## 2021-11-26 ENCOUNTER — Other Ambulatory Visit: Payer: Self-pay

## 2021-11-26 ENCOUNTER — Ambulatory Visit (INDEPENDENT_AMBULATORY_CARE_PROVIDER_SITE_OTHER): Payer: Medicare Other

## 2021-11-26 ENCOUNTER — Ambulatory Visit: Payer: Medicare Other | Admitting: Podiatry

## 2021-11-26 ENCOUNTER — Encounter: Payer: Self-pay | Admitting: Podiatry

## 2021-11-26 DIAGNOSIS — M7662 Achilles tendinitis, left leg: Secondary | ICD-10-CM

## 2021-11-26 DIAGNOSIS — S90111A Contusion of right great toe without damage to nail, initial encounter: Secondary | ICD-10-CM | POA: Diagnosis not present

## 2021-11-26 MED ORDER — MELOXICAM 15 MG PO TABS
15.0000 mg | ORAL_TABLET | Freq: Every day | ORAL | 0 refills | Status: DC
  Filled 2021-11-26: qty 30, 30d supply, fill #0

## 2021-11-26 NOTE — Progress Notes (Signed)
°  Subjective:  Patient ID: Jason Salazar, male    DOB: 06-11-1946,   MRN: [id number]  Chief Complaint  Patient presents with   Foot Pain    Posterior heel left - aching intermittent x few months, thought could be gout   Toe Pain    Hallux right - medial border - tender x 1 week, using antibiotic ointment at home    75 y.o. male presents for concern of left heel pain that has been present for a few months. Relates it is on and off and will ache. States he is concerned for gout. Also relates tenderness to his right hallux medial nail border for about a week Relates he his the toe about three times and had some bruising. Been getting better. Has been using an antibiotics ointment.  . Denies any other pedal complaints. Denies n/v/f/c.   Past Medical History:  Diagnosis Date   Allergic rhinitis    Balantidiasis    Cancer [facility])    prostate cancer treated 2007 with radiation seed placement   Diabetes ([place])    H/O prostate cancer    Radioactive seeds   Hypercholesteremia    Hyperlipidemia    Hyperlipoproteinemia    Hypertension    Sleep apnea     Objective:  Physical Exam: Vascular: DP/PT pulses 2/4 bilateral. CFT <3 seconds. Normal hair growth on digits. No edema.  Skin. No lacerations or abrasions bilateral feet. Right hallux with surrounding ecchymosis mild.  Musculoskeletal: MMT 5/5 bilateral lower extremities in DF, PF, Inversion and Eversion. Deceased ROM in DF of ankle joint. Tender to posterior left heel at insertion of the Achilles tendon. Pain with dorsiflexion no pain with plantarflexion. Neurological: Sensation intact to light touch.   Assessment:   1. Achilles tendinitis of left lower extremity      Plan:  Patient was evaluated and treated and all questions answered. -X-rays reviewed and discussed with patient. No acute fractures or dislocations noted. Spurring noted at the posterior heel. Degenerative changes noted to the first metatarsophalangeal joint.   -Discussed Achilles insertional tendonitis and treatment options with patient.  -Discussed stretching exercises. -Rx Meloxicam provided  -Heel lifts provided and discussed proper shoewear.  -Discussed if no improvement will consider MRI/PT/EPAT/PRP injections.  Right toe healing well. Discontinue neosporin and bandaids.  -Patient to return to office in 7 weeks for re-evaluation.    Lorenda Peck, DPM

## 2021-11-26 NOTE — Patient Instructions (Signed)

## 2021-12-06 ENCOUNTER — Other Ambulatory Visit (HOSPITAL_BASED_OUTPATIENT_CLINIC_OR_DEPARTMENT_OTHER): Payer: Self-pay

## 2021-12-06 MED ORDER — ROSUVASTATIN CALCIUM 10 MG PO TABS
ORAL_TABLET | ORAL | 1 refills | Status: DC
  Filled 2022-01-07: qty 90, 90d supply, fill #0

## 2021-12-06 MED ORDER — LISINOPRIL-HYDROCHLOROTHIAZIDE 20-12.5 MG PO TABS: ORAL_TABLET | ORAL | 1 refills | Status: DC

## 2021-12-06 MED ORDER — ALLOPURINOL 100 MG PO TABS
ORAL_TABLET | ORAL | 1 refills | Status: DC
  Filled 2021-12-06: qty 180, 90d supply, fill #0

## 2021-12-07 ENCOUNTER — Other Ambulatory Visit (HOSPITAL_BASED_OUTPATIENT_CLINIC_OR_DEPARTMENT_OTHER): Payer: Self-pay

## 2021-12-07 ENCOUNTER — Other Ambulatory Visit (HOSPITAL_COMMUNITY): Payer: Self-pay

## 2021-12-07 MED ORDER — TRESIBA FLEXTOUCH 100 UNIT/ML ~~LOC~~ SOPN
10.0000 [IU] | PEN_INJECTOR | Freq: Every evening | SUBCUTANEOUS | 5 refills | Status: DC
  Filled 2021-12-07: qty 6, 60d supply, fill #0
  Filled 2021-12-07: qty 3, 30d supply, fill #0
  Filled 2022-01-11: qty 3, 30d supply, fill #1
  Filled 2022-02-04: qty 3, 30d supply, fill #2
  Filled 2022-03-11: qty 3, 30d supply, fill #3
  Filled 2022-04-12: qty 3, 30d supply, fill #4
  Filled 2022-05-03: qty 3, 30d supply, fill #5
  Filled 2022-05-29: qty 3, 30d supply, fill #6
  Filled 2022-06-21: qty 3, 30d supply, fill #7
  Filled 2022-07-11: qty 12, 120d supply, fill #8

## 2021-12-07 MED ORDER — METHYLPREDNISOLONE 4 MG PO TBPK
ORAL_TABLET | ORAL | 0 refills | Status: DC
  Filled 2021-12-07: qty 21, 6d supply, fill #0

## 2021-12-13 ENCOUNTER — Other Ambulatory Visit (HOSPITAL_BASED_OUTPATIENT_CLINIC_OR_DEPARTMENT_OTHER): Payer: Self-pay

## 2021-12-14 ENCOUNTER — Other Ambulatory Visit (HOSPITAL_BASED_OUTPATIENT_CLINIC_OR_DEPARTMENT_OTHER): Payer: Self-pay

## 2021-12-19 ENCOUNTER — Other Ambulatory Visit (HOSPITAL_BASED_OUTPATIENT_CLINIC_OR_DEPARTMENT_OTHER): Payer: Self-pay

## 2021-12-19 ENCOUNTER — Other Ambulatory Visit (HOSPITAL_COMMUNITY): Payer: Self-pay

## 2021-12-19 MED ORDER — INSULIN PEN NEEDLE 31G X 5 MM MISC
5 refills | Status: DC
  Filled 2021-12-19: qty 300, 90d supply, fill #0
  Filled 2022-12-05: qty 300, 100d supply, fill #0

## 2021-12-19 MED ORDER — INSULIN PEN NEEDLE 31G X 5 MM MISC
5 refills | Status: DC
  Filled 2021-12-19: qty 100, 30d supply, fill #0
  Filled 2022-01-28 – 2022-03-20 (×2): qty 100, 30d supply, fill #1
  Filled 2022-07-05: qty 100, 30d supply, fill #2
  Filled 2022-08-30: qty 100, 30d supply, fill #3

## 2021-12-25 ENCOUNTER — Other Ambulatory Visit (HOSPITAL_BASED_OUTPATIENT_CLINIC_OR_DEPARTMENT_OTHER): Payer: Self-pay

## 2022-01-02 ENCOUNTER — Other Ambulatory Visit (HOSPITAL_BASED_OUTPATIENT_CLINIC_OR_DEPARTMENT_OTHER): Payer: Self-pay

## 2022-01-03 ENCOUNTER — Other Ambulatory Visit (HOSPITAL_BASED_OUTPATIENT_CLINIC_OR_DEPARTMENT_OTHER): Payer: Self-pay

## 2022-01-03 ENCOUNTER — Encounter (HOSPITAL_BASED_OUTPATIENT_CLINIC_OR_DEPARTMENT_OTHER): Payer: Self-pay | Admitting: Pharmacist

## 2022-01-03 MED ORDER — LUMIGAN 0.01 % OP SOLN
1.0000 [drp] | Freq: Every day | OPHTHALMIC | 6 refills | Status: AC
  Filled 2022-01-03: qty 5, 50d supply, fill #0
  Filled 2022-12-18: qty 2.5, 25d supply, fill #1

## 2022-01-04 ENCOUNTER — Other Ambulatory Visit (HOSPITAL_BASED_OUTPATIENT_CLINIC_OR_DEPARTMENT_OTHER): Payer: Self-pay

## 2022-01-07 ENCOUNTER — Other Ambulatory Visit (HOSPITAL_BASED_OUTPATIENT_CLINIC_OR_DEPARTMENT_OTHER): Payer: Self-pay

## 2022-01-11 ENCOUNTER — Other Ambulatory Visit (HOSPITAL_BASED_OUTPATIENT_CLINIC_OR_DEPARTMENT_OTHER): Payer: Self-pay

## 2022-01-14 ENCOUNTER — Ambulatory Visit: Payer: Medicare Other | Admitting: Podiatry

## 2022-01-16 ENCOUNTER — Other Ambulatory Visit (HOSPITAL_BASED_OUTPATIENT_CLINIC_OR_DEPARTMENT_OTHER): Payer: Self-pay

## 2022-01-23 ENCOUNTER — Other Ambulatory Visit (HOSPITAL_BASED_OUTPATIENT_CLINIC_OR_DEPARTMENT_OTHER): Payer: Self-pay

## 2022-01-24 ENCOUNTER — Other Ambulatory Visit (HOSPITAL_BASED_OUTPATIENT_CLINIC_OR_DEPARTMENT_OTHER): Payer: Self-pay

## 2022-01-24 MED ORDER — EMPAGLIFLOZIN-METFORMIN HCL ER 10-1000 MG PO TB24
ORAL_TABLET | ORAL | 2 refills | Status: DC
  Filled 2022-01-24: qty 30, 30d supply, fill #0
  Filled 2022-02-18: qty 30, 30d supply, fill #1
  Filled 2022-03-27: qty 30, 30d supply, fill #2

## 2022-01-25 ENCOUNTER — Other Ambulatory Visit (HOSPITAL_BASED_OUTPATIENT_CLINIC_OR_DEPARTMENT_OTHER): Payer: Self-pay

## 2022-01-28 ENCOUNTER — Other Ambulatory Visit (HOSPITAL_BASED_OUTPATIENT_CLINIC_OR_DEPARTMENT_OTHER): Payer: Self-pay

## 2022-01-28 ENCOUNTER — Telehealth: Payer: Self-pay | Admitting: Internal Medicine

## 2022-01-29 NOTE — Telephone Encounter (Signed)
Called and spoke with pt's spouse [name] who states that pt falls asleep constantly. States that pt does wear the cpap every night maybe about 6-7 hours a night. She states at times pt will wake up around 3-4am and will state that he is not able to go back to sleep.  Asked [name] about how many naps pt took throughout the day and she said that pt will take a nap at least 3-4 times a day due to all this.  [name] wants to know what might be recommended due to this if pt might need to have another sleep study to see what that shows compared to the last one pt had or if his cpap pressure might need to be changed to see if that helps any.  Dr. Annamaria Boots, please advise.

## 2022-02-04 ENCOUNTER — Other Ambulatory Visit (HOSPITAL_BASED_OUTPATIENT_CLINIC_OR_DEPARTMENT_OTHER): Payer: Self-pay

## 2022-02-04 ENCOUNTER — Other Ambulatory Visit (HOSPITAL_COMMUNITY): Payer: Self-pay

## 2022-02-04 MED ORDER — COLCHICINE 0.6 MG PO CAPS
1.0000 | ORAL_CAPSULE | Freq: Two times a day (BID) | ORAL | 2 refills | Status: DC | PRN
  Filled 2022-02-04 (×2): qty 30, 15d supply, fill #0
  Filled 2022-12-18: qty 30, 15d supply, fill #1

## 2022-02-05 NOTE — Telephone Encounter (Signed)
Appt scheduled 02/08/2022 at 11:00. Patient is aware and voiced his understanding.  Nothing further needed.

## 2022-02-05 NOTE — Telephone Encounter (Signed)
I should see Jason Salazar back to review his status and look at options. Next few weeks. Ok to use held spot.
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
## 2022-02-08 ENCOUNTER — Ambulatory Visit: Payer: Medicare Other | Admitting: Internal Medicine

## 2022-02-14 ENCOUNTER — Encounter (HOSPITAL_BASED_OUTPATIENT_CLINIC_OR_DEPARTMENT_OTHER): Payer: Self-pay

## 2022-02-18 ENCOUNTER — Other Ambulatory Visit (HOSPITAL_BASED_OUTPATIENT_CLINIC_OR_DEPARTMENT_OTHER): Payer: Self-pay

## 2022-02-19 ENCOUNTER — Other Ambulatory Visit (HOSPITAL_BASED_OUTPATIENT_CLINIC_OR_DEPARTMENT_OTHER): Payer: Self-pay

## 2022-02-22 ENCOUNTER — Other Ambulatory Visit (HOSPITAL_BASED_OUTPATIENT_CLINIC_OR_DEPARTMENT_OTHER): Payer: Self-pay
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
## 2022-03-11 ENCOUNTER — Encounter: Payer: Self-pay | Admitting: Internal Medicine

## 2022-03-11 ENCOUNTER — Other Ambulatory Visit (HOSPITAL_BASED_OUTPATIENT_CLINIC_OR_DEPARTMENT_OTHER): Payer: Self-pay

## 2022-03-11 ENCOUNTER — Encounter (HOSPITAL_BASED_OUTPATIENT_CLINIC_OR_DEPARTMENT_OTHER): Payer: Self-pay

## 2022-03-11 ENCOUNTER — Ambulatory Visit: Payer: Medicare Other | Admitting: Internal Medicine

## 2022-03-11 DIAGNOSIS — G4733 Obstructive sleep apnea (adult) (pediatric): Secondary | ICD-10-CM

## 2022-03-11 DIAGNOSIS — Z72821 Inadequate sleep hygiene: Secondary | ICD-10-CM | POA: Diagnosis not present

## 2022-03-11 MED ORDER — AMPHETAMINE-DEXTROAMPHETAMINE 10 MG PO TABS
10.0000 mg | ORAL_TABLET | Freq: Every day | ORAL | 0 refills | Status: DC
  Filled 2022-03-11: qty 30, 30d supply, fill #0

## 2022-03-11 NOTE — Patient Instructions (Signed)
Script sent to [facility] for adderall- try one each morning if needed for alertness. Skip when not needed. ? ?Try taking a deliberate nap- 20-30 minutes, maybe after lunch, if needed ? ?Continue CPAP auto 5-20 ? ?Please call if we can help ?

## 2022-03-13 ENCOUNTER — Other Ambulatory Visit (HOSPITAL_BASED_OUTPATIENT_CLINIC_OR_DEPARTMENT_OTHER): Payer: Self-pay

## 2022-03-14 ENCOUNTER — Other Ambulatory Visit (HOSPITAL_BASED_OUTPATIENT_CLINIC_OR_DEPARTMENT_OTHER): Payer: Self-pay

## 2022-03-20 ENCOUNTER — Other Ambulatory Visit (HOSPITAL_BASED_OUTPATIENT_CLINIC_OR_DEPARTMENT_OTHER): Payer: Self-pay

## 2022-03-25 ENCOUNTER — Other Ambulatory Visit (HOSPITAL_BASED_OUTPATIENT_CLINIC_OR_DEPARTMENT_OTHER): Payer: Self-pay

## 2022-03-26 ENCOUNTER — Other Ambulatory Visit (HOSPITAL_BASED_OUTPATIENT_CLINIC_OR_DEPARTMENT_OTHER): Payer: Self-pay

## 2022-03-27 ENCOUNTER — Other Ambulatory Visit (HOSPITAL_BASED_OUTPATIENT_CLINIC_OR_DEPARTMENT_OTHER): Payer: Self-pay

## 2022-04-01 ENCOUNTER — Other Ambulatory Visit (HOSPITAL_BASED_OUTPATIENT_CLINIC_OR_DEPARTMENT_OTHER): Payer: Self-pay

## 2022-04-07 ENCOUNTER — Encounter: Payer: Self-pay | Admitting: Internal Medicine

## 2022-04-07 NOTE — Assessment & Plan Note (Signed)
Suspect he is not getting as much sleep time as he thinks. Doubt primary hypersomnia. ?Plan- continue CPAP. Ok to nap. Try adderall  ?

## 2022-04-07 NOTE — Assessment & Plan Note (Signed)
Benefits from CPAP- Plan continue auto 5-20 

## 2022-04-08 ENCOUNTER — Other Ambulatory Visit (HOSPITAL_BASED_OUTPATIENT_CLINIC_OR_DEPARTMENT_OTHER): Payer: Self-pay

## 2022-04-08 MED ORDER — ROSUVASTATIN CALCIUM 10 MG PO TABS
ORAL_TABLET | ORAL | 1 refills | Status: DC
  Filled 2022-04-08: qty 90, 90d supply, fill #0
  Filled 2022-07-22: qty 90, 90d supply, fill #1

## 2022-04-09 ENCOUNTER — Other Ambulatory Visit (HOSPITAL_BASED_OUTPATIENT_CLINIC_OR_DEPARTMENT_OTHER): Payer: Self-pay

## 2022-04-12 ENCOUNTER — Other Ambulatory Visit (HOSPITAL_BASED_OUTPATIENT_CLINIC_OR_DEPARTMENT_OTHER): Payer: Self-pay

## 2022-05-03 ENCOUNTER — Other Ambulatory Visit (HOSPITAL_BASED_OUTPATIENT_CLINIC_OR_DEPARTMENT_OTHER): Payer: Self-pay

## 2022-05-04 ENCOUNTER — Other Ambulatory Visit (HOSPITAL_BASED_OUTPATIENT_CLINIC_OR_DEPARTMENT_OTHER): Payer: Self-pay

## 2022-05-07 ENCOUNTER — Other Ambulatory Visit (HOSPITAL_BASED_OUTPATIENT_CLINIC_OR_DEPARTMENT_OTHER): Payer: Self-pay

## 2022-05-07 MED ORDER — SYNJARDY XR 10-1000 MG PO TB24
ORAL_TABLET | Freq: Every day | ORAL | 2 refills | Status: DC
  Filled 2022-05-07: qty 90, 90d supply, fill #0

## 2022-05-07 MED ORDER — SYNJARDY XR 10-1000 MG PO TB24
ORAL_TABLET | ORAL | 2 refills | Status: DC
  Filled 2022-05-07 (×2): qty 90, 90d supply, fill #0

## 2022-05-29 ENCOUNTER — Other Ambulatory Visit (HOSPITAL_BASED_OUTPATIENT_CLINIC_OR_DEPARTMENT_OTHER): Payer: Self-pay

## 2022-05-31 ENCOUNTER — Other Ambulatory Visit: Payer: Self-pay | Admitting: Urology

## 2022-05-31 DIAGNOSIS — N281 Cyst of kidney, acquired: Secondary | ICD-10-CM

## 2022-06-17 ENCOUNTER — Other Ambulatory Visit (HOSPITAL_BASED_OUTPATIENT_CLINIC_OR_DEPARTMENT_OTHER): Payer: Self-pay

## 2022-06-21 ENCOUNTER — Other Ambulatory Visit (HOSPITAL_BASED_OUTPATIENT_CLINIC_OR_DEPARTMENT_OTHER): Payer: Self-pay

## 2022-07-02 ENCOUNTER — Other Ambulatory Visit (HOSPITAL_BASED_OUTPATIENT_CLINIC_OR_DEPARTMENT_OTHER): Payer: Self-pay

## 2022-07-05 ENCOUNTER — Other Ambulatory Visit (HOSPITAL_BASED_OUTPATIENT_CLINIC_OR_DEPARTMENT_OTHER): Payer: Self-pay

## 2022-07-11 ENCOUNTER — Ambulatory Visit
Admission: RE | Admit: 2022-07-11 | Discharge: 2022-07-11 | Disposition: A | Discharge status: Home / Self Care | Payer: Medicare Other | Source: Ambulatory Visit | Attending: Urology | Admitting: Urology

## 2022-07-11 ENCOUNTER — Other Ambulatory Visit (HOSPITAL_BASED_OUTPATIENT_CLINIC_OR_DEPARTMENT_OTHER): Payer: Self-pay

## 2022-07-11 DIAGNOSIS — N281 Cyst of kidney, acquired: Secondary | ICD-10-CM

## 2022-07-11 MED ORDER — GADOBENATE DIMEGLUMINE 529 MG/ML IV SOLN
17.0000 mL | Freq: Once | INTRAVENOUS | Status: AC | PRN
  Administered 2022-07-11: 17 mL via INTRAVENOUS

## 2022-07-18 ENCOUNTER — Telehealth: Payer: Self-pay | Admitting: Family Medicine

## 2022-07-18 NOTE — Telephone Encounter (Signed)
LVM and sent mychart msg informing pt of need to reschedule 11/22 appointment - Amy out

## 2022-07-22 ENCOUNTER — Other Ambulatory Visit (HOSPITAL_BASED_OUTPATIENT_CLINIC_OR_DEPARTMENT_OTHER): Payer: Self-pay

## 2022-08-28 ENCOUNTER — Other Ambulatory Visit: Payer: Self-pay | Admitting: Internal Medicine

## 2022-08-29 ENCOUNTER — Other Ambulatory Visit (HOSPITAL_BASED_OUTPATIENT_CLINIC_OR_DEPARTMENT_OTHER): Payer: Self-pay

## 2022-08-29 LAB — RENAL PROFILE WITH ESTIMATED GFR
Albumin: 4.4 g/dL (ref 3.6–5.1)
BUN/Creatinine Ratio: 16 (calc) (ref 6–22)
BUN: 22 mg/dL (ref 7–25)
CO2: 23 mmol/L (ref 20–32)
Calcium: 9.1 mg/dL (ref 8.6–10.3)
Chloride: 103 mmol/L (ref 98–110)
Creat: 1.38 mg/dL — ABNORMAL HIGH (ref 0.70–1.28)
Glucose, Bld: 130 mg/dL — ABNORMAL HIGH (ref 65–99)
Phosphorus: 3.8 mg/dL (ref 2.1–4.3)
Potassium: 4.5 mmol/L (ref 3.5–5.3)
Sodium: 138 mmol/L (ref 135–146)
eGFR: 54 mL/min/{1.73_m2} — ABNORMAL LOW (ref >=60)

## 2022-08-29 LAB — EXTRA LAV TOP TUBE

## 2022-08-29 MED ORDER — LUMIGAN 0.01 % OP SOLN
1.0000 [drp] | Freq: Every day | OPHTHALMIC | 6 refills | Status: DC
  Filled 2022-08-29: qty 2.5, 25d supply, fill #0

## 2022-08-30 ENCOUNTER — Other Ambulatory Visit (HOSPITAL_BASED_OUTPATIENT_CLINIC_OR_DEPARTMENT_OTHER): Payer: Self-pay

## 2022-09-02 ENCOUNTER — Other Ambulatory Visit (HOSPITAL_BASED_OUTPATIENT_CLINIC_OR_DEPARTMENT_OTHER): Payer: Self-pay

## 2022-09-06 ENCOUNTER — Other Ambulatory Visit (HOSPITAL_BASED_OUTPATIENT_CLINIC_OR_DEPARTMENT_OTHER): Payer: Self-pay

## 2022-09-08 NOTE — Progress Notes (Deleted)
HPI M never smoker followed for OSA, Irregular sleep schedule, complicated by HTN, OSA, Allergic Rhinitis, DM, Epilepsy, CKD3, hx Prostate Cancer/ seeds, Hypercholesterolemia, Glaucoma,  NPSG 01/31/13- AHI 123.3/ hr, desaturation to 81%, CPAP to 13, Body weight 210 lbs  ==================================================================================    03/11/22- 75 yoM never smoker followed for OSA, Irregular sleep schedule, complicated by HTN, OSA, Allergic Rhinitis, DM, Epilepsy, CKD3,  hx Prostate Cancer/ seeds, Hypercholesterolemia, Glaucoma,  CPAP auto 5-20/ [facility]   AirSense 10 AutoSet Download-compliance 97%, AHI 3.1/ hr Body weight today-181 lbs Covid vax-3 Phizer Flu vax-had Wife had called in Feb reporting that pt wears CPAP but still falls asleep frequently in day. He feels that he is not getting enough pressure on the CPAP.  Download reviewed with him. 1 cup morning coffee. Dozes off if sits, but denies formal naps. Thinks he sleeps through the night. Bedtime midnight with latency 30 minutes. No sleep meds. Denies cataplexy, sleep paralysis.  09/10/22- 75 yoM never smoker followed for OSA, Irregular sleep schedule, complicated by HTN, OSA, Allergic Rhinitis, DM, Epilepsy, CKD3, hx Prostate Cancer/ seeds, Hypercholesterolemia, Glaucoma,  -Adderall 10,  CPAP auto 5-20/ [facility]   AirSense 10 AutoSet Download-compliance Body weight today- Covid vax-3 Phizer Flu vax-   ROS-see HPI   + = positive Constitutional:    weight loss, night sweats, fevers, chills, fatigue, lassitude. HEENT:    headaches, difficulty swallowing, +tooth/dental problems, sore throat,       +sneezing, itching, ear ache, +nasal congestion, post nasal drip, snoring CV:    chest pain, orthopnea, PND, +swelling in lower extremities, anasarca,                                   dizziness, palpitations Resp:   shortness of breath with exertion or at rest.                productive cough,   non-productive cough,  coughing up of blood.              change in color of mucus.  wheezing.   Skin:    rash or lesions. GI:  No-   heartburn, indigestion, abdominal pain, nausea, vomiting, diarrhea,                 change in bowel habits, loss of appetite GU: dysuria, change in color of urine, no urgency or frequency.   flank pain. MS:   +joint pain, stiffness, decreased range of motion, back pain. Neuro-     nothing unusual Psych:  change in mood or affect.  depression or anxiety.   memory loss.  OBJ- Physical Exam General- Alert, Oriented, Affect-appropriate, Distress- none acute, + obese Skin- rash-none, lesions- none, excoriation- none Lymphadenopathy- none Head- atraumatic            Eyes- Gross vision intact, PERRLA, conjunctivae and secretions clear            Ears- Hearing, canals-normal            Nose- Clear, no-Septal dev, mucus, polyps, erosion, perforation             Throat- Mallampati IV , mucosa clear , drainage- none, tonsils- atrophic Neck- flexible , trachea midline, no stridor , thyroid nl, carotid no bruit Chest - symmetrical excursion , unlabored           Heart/CV- RRR , no murmur , no gallop  , no rub, nl s1  s2                           - JVD- none , edema- none, stasis changes- none, varices- none           Lung- clear to P&A, wheeze- none, cough- none , dullness-none, rub- none           Chest wall-  Abd-  Br/ Gen/ Rectal- Not done, not indicated Extrem- cyanosis- none, clubbing, none, atrophy- none, strength- nl Neuro- grossly intact to observation

## 2022-09-09 ENCOUNTER — Encounter: Payer: Self-pay | Admitting: Family Medicine

## 2022-09-09 ENCOUNTER — Ambulatory Visit: Payer: Medicare Other | Admitting: Family Medicine

## 2022-09-10 ENCOUNTER — Ambulatory Visit: Payer: Medicare Other | Admitting: Internal Medicine

## 2022-09-13 ENCOUNTER — Other Ambulatory Visit (HOSPITAL_BASED_OUTPATIENT_CLINIC_OR_DEPARTMENT_OTHER): Payer: Self-pay

## 2022-09-17 NOTE — Progress Notes (Unsigned)
No chief complaint on file.   HISTORY OF PRESENT ILLNESS:  09/17/22 ALL: Chazz returns for follow up for seizures. He continues Dilantin ER (brand) '400mg'$  at bedtime.   Last creatinine 1.38, GFR 54.  He is taking a multivitamin daily.   10/30/2021 ALL: Octave returns for follow up for seizures. He continues to do well on Dilantin ER '400mg'$  at bedtime. He is tolerating meds well. No seizures. Last seizure 2010. He is on a multiple vitamin. He is seeing Dr Rolena Infante for lumbar back pain. Last creatinine 1.7. he is followed closely by PCP.   11/01/2020 ALL:  Ronnald Shedden is a 76 y.o. male here today for follow up for seizures. He continues Dilantin ER and tolerating well. No seizures. He is followed closely by PCP. Recent labs were normal. Patient reports creatinine now normal. Phenytoin level not checked.   HISTORY (copied from previous note)  UPDATE (11/02/19, VRP): Since last visit, doing well. Symptoms are stable. No seizures. No alleviating or aggravating factors. Tolerating dilantin. Last seizure ~ 2010 or earlier.    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  Seizure disorder beginning at the age of 38 following a head injury.  No seizures in several years now.  He is currently on brand Dilantin without side effects.  No balance issues no falls no daytime drowsiness.  He has a history of obstructive sleep apnea and uses CPAP.  He is retired.  He returns for reevaluation he needs labs and refills   UPDATE (10/29/17, CM) Mr. Kranz, 76year-old black male returns for  yearly followup. 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 No seizures in several years. He denies any missed doses of his medication. His seizure disorder began at age 29 following a head injury. He  denies any side effects to his Dilantin, (BRAND). He denies any falls. He denies any daytime drowsiness. He also is diabetic but says his blood sugars are in good control. [name]  He gets no regular exercise. He has had some numbness in his feet no pain. He also has a history of high blood pressure and sleep disorder for which he uses CPAP at 13 cm of pressure. He is  Retired. He returns for reevaluation, labs and refills.   REVIEW OF SYSTEMS: Out of a complete 14 system review of symptoms, the patient complains only of the following symptoms, lower extremity edema, low back pain and all other reviewed systems are negative.   ALLERGIES: Allergies  Allergen Reactions   Atorvastatin Other (See Comments)    Other reaction(s): lightheaded     HOME MEDICATIONS: Outpatient Medications Prior to Visit  Medication Sig Dispense Refill   allopurinol (ZYLOPRIM) 100 MG tablet Take 100 mg by mouth daily.      allopurinol (ZYLOPRIM) 100 MG tablet Take 2 tablets by  mouth once daily. 180 tablet 1   amphetamine-dextroamphetamine (ADDERALL) 10 MG tablet Take 1 tablet (10 mg total) by mouth daily with breakfast. 30 tablet 0   aspirin EC 81 MG tablet Take 81 mg by mouth daily.     b complex vitamins tablet Take 1 tablet by mouth daily.     bimatoprost (LUMIGAN) 0.01 % SOLN Instill 1 drop into both eyes at bedtime 7.5 mL 6   bimatoprost (LUMIGAN) 0.01 % SOLN Instill 1 drop into  both eyes at bedtime 7.5 mL 6   brinzolamide (AZOPT) 1 % ophthalmic suspension Azopt 1 % eye drops,suspension     Colchicine 0.6 MG CAPS Take 1 capsule by mouth 2 (two) times daily as needed for gout flare up 30 capsule 2   diclofenac sodium (VOLTAREN) 1 % GEL Apply 2 g topically as needed.      diclofenac Sodium (VOLTAREN) 1 % GEL Apply 4 g topically 4 (four) times daily as needed for pain 500 g 5   DILANTIN 100 MG ER capsule Take 4 capsules (400 mg total) by mouth at bedtime. 360 capsule 4   Empagliflozin-metFORMIN HCl ER (SYNJARDY XR)  09-999 MG TB24 Take 1 tablet every day 30 tablet 2   Empagliflozin-metFORMIN HCl ER (SYNJARDY XR) 09-999 MG TB24 1 po qd 90 tablet 2   Empagliflozin-metFORMIN HCl ER (SYNJARDY XR) 09-999 MG TB24 Take 1 tablet by mouth once daily. 90 tablet 2   fluticasone (FLONASE) 50 MCG/ACT nasal spray Place 1 spray into both nostrils as needed.      gabapentin (NEURONTIN) 300 MG capsule Take 1 capsule (300 mg total) by mouth at bedtime. 90 capsule 2   glucose blood test strip Use for once daily glucose testing (Dx: E11.9) 100 each 5   HYDROcodone bit-homatropine (HYCODAN) 5-1.5 MG/5ML syrup Take 5 mLs by mouth every 4 (four) hours as needed for cough.     insulin degludec (TRESIBA FLEXTOUCH) 100 UNIT/ML FlexTouch Pen Inject 10 Units into the skin every evening. 6 mL 5   Insulin Pen Needle 31G X 5 MM MISC Use as directed for insulin injections 3 times daily 300 each 5   Insulin Pen Needle 31G X 5 MM MISC Use for insulin injection 3 times a day 300 each 5   Lancets (ONETOUCH DELICA PLUS [id number]) MISC Use for once daily glucose testing (Dx: E11.9) 100 each 5   lisinopril-hydrochlorothiazide (PRINZIDE,ZESTORETIC) 20-12.5 MG tablet Take 1 tablet by mouth daily. 7 tablet 0   lisinopril-hydrochlorothiazide (ZESTORETIC) 20-12.5 MG tablet Take 1 tablet by mouth once daily. 90 tablet 1   meloxicam (MOBIC) 15 MG tablet Take 1 tablet (15 mg total) by mouth daily. 30 tablet 0   methylPREDNISolone (MEDROL) 4 MG TBPK tablet Take as directed on package instructions. 21 tablet 0   metoCLOPramide (REGLAN) 5 MG tablet Take 5 mg by mouth 3 (three) times daily as needed.     mometasone (NASONEX) 50 MCG/ACT nasal spray 2 sprays as needed.     Multiple Vitamins-Minerals (ONE-A-DAY MENS 50+ ADVANTAGE PO) Take 1 tablet by mouth daily.     ONETOUCH VERIO test strip SMARTSIG:Strip(s) Via Meter Daily     rosuvastatin (CRESTOR) 10 MG tablet Take 10 mg by mouth daily.     rosuvastatin (CRESTOR) 10 MG tablet Take 1 tablet by mouth once  daily. 90 tablet 1   No facility-administered medications prior to visit.     PAST MEDICAL HISTORY: Past Medical History:  Diagnosis Date   Allergic rhinitis    Balantidiasis    Cancer [facility])    prostate cancer treated 2007 with radiation seed placement   Diabetes ([place])    H/O prostate cancer    Radioactive seeds   Hypercholesteremia    Hyperlipidemia    Hyperlipoproteinemia    Hypertension    Sleep apnea      PAST SURGICAL HISTORY: Past Surgical History:  Procedure Laterality Date   cyst removed from right kidney       FAMILY HISTORY: Family History  Problem Relation Age of Onset   CVA Mother    Sleep apnea Brother    Thyroid cancer Father    Prostate cancer Father      SOCIAL HISTORY: Social History   Socioeconomic History   Marital status: Married    Spouse name: [name]   Number of children: 1   Years of education: HS   Highest education level: Not on file  Occupational History    Employer: [employer]  Tobacco Use   Smoking status: Never   Smokeless tobacco: Never  Vaping Use   Vaping Use: Never used  Substance and Sexual Activity   Alcohol use: No   Drug use: No   Sexual activity: Not on file  Other Topics Concern   Not on file  Social History Narrative   Patient is married to Adrian and has one son.   Caffeine Use: none; quit a few months ago             Social Determinants of Radio broadcast assistant Strain: Not on file  Food Insecurity: Not on file  Transportation Needs: Not on file  Physical Activity: Not on file  Stress: Not on file  Social Connections: Not on file  Intimate Partner Violence: Not on file    PHYSICAL EXAM  There were no vitals filed for this visit.   There is no height or weight on file to calculate BMI.   Generalized: Well developed, in no acute distress  Cardiology: normal rate and rhythm, no murmur auscultated  Respiratory: clear to auscultation bilaterally    Neurological  examination  Mentation: Alert oriented to time, place, history taking. Follows all commands speech and language fluent Cranial nerve II-XII: Pupils were equal round reactive to light. Extraocular movements were full, visual field were full on confrontational test. Facial sensation and strength were normal. Head turning and shoulder shrug  were normal and symmetric. Motor: The motor testing reveals 5 over 5 strength of all 4 extremities. Good symmetric motor tone is noted throughout.  Sensory: Sensory testing is intact to soft touch on all 4 extremities. No evidence of extinction is noted.  Coordination: Cerebellar testing reveals good finger-nose-finger and heel-to-shin bilaterally.  Gait and station: Gait is normal.  Reflexes: Deep tendon reflexes are symmetric and normal bilaterally.    DIAGNOSTIC DATA (LABS, IMAGING, TESTING) - I reviewed patient records, labs, notes, testing and imaging myself where available.  Lab Results  Component Value Date   WBC 4.8 10/09/2021   HGB 13.7 10/09/2021   HCT 42.2 10/09/2021   MCV 87 10/09/2021   PLT 135 (L) 10/09/2021      Component Value Date/Time   NA 138 08/28/2022 0000   NA 133 (L) 10/09/2021 1422   K 4.5 08/28/2022 0000   CL 103 08/28/2022 0000   CO2 23 08/28/2022 0000   GLUCOSE 130 (H) 08/28/2022 0000   BUN 22 08/28/2022 0000   BUN 39 (H) 10/09/2021 1422   CREATININE 1.38 (H) 08/28/2022 0000   CALCIUM 9.1 08/28/2022 0000   PROT 7.0 03/19/2021 0913   PROT 6.4 10/29/2018 1044   ALBUMIN 4.3 10/29/2018 1044   AST 25 03/19/2021 0913   ALT 33 03/19/2021 0913   ALKPHOS 129 (H) 10/29/2018 1044   BILITOT 0.3 03/19/2021 0913   BILITOT <0.2 10/29/2018 1044   GFRNONAA 46 (L) 03/19/2021 0913   GFRAA 53 (L) 03/19/2021 0913   Lab Results  Component Value Date   CHOL 172 03/19/2021   HDL 53  03/19/2021   LDLCALC 83 03/19/2021   TRIG 300 (H) 03/19/2021   CHOLHDL 3.2 03/19/2021   No results found for: "HGBA1C" No results found for:  "VITAMINB12" Lab Results  Component Value Date   TSH 1.530 10/09/2021    ASSESSMENT AND PLAN  76 y.o. year old male  has a past medical history of Allergic rhinitis, Balantidiasis, Cancer ([place]), Diabetes ([place]), H/O prostate cancer, Hypercholesteremia, Hyperlipidemia, Hyperlipoproteinemia, Hypertension, and Sleep apnea. here with   No diagnosis found.  He is doing well. We will continue phenytoin '400mg'$  daily. I will update labs today. Consider DEXA screening. He will discuss with PCP. Continue vitamin D and calcium supplements. Continue close follow up with Dr Rolena Infante for pain management. Healthy lifestyle habits encouraged. Seizure precautions advised. He will follow up annually, sooner if needed.   Debbora Presto, MSN, FNP-C 09/17/2022, 1:26 PM  [facility] [address] [place], [place] [address] [phone]

## 2022-09-17 NOTE — Patient Instructions (Signed)
Below is our plan:  We will continue Dilantin ER 400mg  daily.   Please make sure you are consistent with timing of seizure medication. I recommend annual visit with primary care provider (PCP) for complete physical and routine blood work. I recommend daily intake of vitamin D (400-800iu) and calcium (800-1000mg ) for bone health. Discuss Dexa screening with PCP.   According to [place] law, you can not drive unless you are seizure / syncope free for at least 6 months and under physician's care.  Please maintain precautions. Do not participate in activities where a loss of awareness could harm you or someone else. No swimming alone, no tub bathing, no hot tubs, no driving, no operating motorized vehicles (cars, ATVs, motocycles, etc), lawnmowers, power tools or firearms. No standing at heights, such as rooftops, ladders or stairs. Avoid hot objects such as stoves, heaters, open fires. Wear a helmet when riding a bicycle, scooter, skateboard, etc. and avoid areas of traffic. Set your water heater to 120 degrees or less.  Please make sure you are staying well hydrated. I recommend 50-60 ounces daily. Well balanced diet and regular exercise encouraged. Consistent sleep schedule with 6-8 hours recommended.   Please continue follow up with care team as directed.   Follow up with me in 1 year   You may receive a survey regarding today's visit. I encourage you to leave honest feed back as I do use this information to improve patient care. Thank you for seeing me today!

## 2022-09-18 ENCOUNTER — Ambulatory Visit: Payer: Medicare Other | Admitting: Family Medicine

## 2022-09-18 ENCOUNTER — Encounter: Payer: Self-pay | Admitting: Family Medicine

## 2022-09-18 VITALS — BP 147/85 | HR 84 | Ht 65.0 in | Wt 182.5 lb

## 2022-09-18 DIAGNOSIS — G40309 Generalized idiopathic epilepsy and epileptic syndromes, not intractable, without status epilepticus: Secondary | ICD-10-CM

## 2022-09-19 LAB — CBC WITH DIFFERENTIAL/PLATELET
Basophils Absolute: 0 10*3/uL (ref 0.0–0.2)
Basos: 1 %
EOS (ABSOLUTE): 0 10*3/uL (ref 0.0–0.4)
Eos: 0 %
Hematocrit: 46.5 % (ref 37.5–51.0)
Hemoglobin: 15.4 g/dL (ref 13.0–17.7)
Immature Grans (Abs): 0 10*3/uL (ref 0.0–0.1)
Immature Granulocytes: 0 %
Lymphocytes Absolute: 1.3 10*3/uL (ref 0.7–3.1)
Lymphs: 28 %
MCH: 27.8 pg (ref 26.6–33.0)
MCHC: 33.1 g/dL (ref 31.5–35.7)
MCV: 84 fL (ref 79–97)
Monocytes Absolute: 0.6 10*3/uL (ref 0.1–0.9)
Monocytes: 12 %
Neutrophils Absolute: 2.9 10*3/uL (ref 1.4–7.0)
Neutrophils: 59 %
Platelets: 155 10*3/uL (ref 150–450)
RBC: 5.54 x10E6/uL (ref 4.14–5.80)
RDW: 12.6 % (ref 11.6–15.4)
WBC: 4.9 10*3/uL (ref 3.4–10.8)

## 2022-09-19 LAB — PHENYTOIN LEVEL, TOTAL: Phenytoin (Dilantin), Serum: 28.4 ug/mL (ref 10.0–20.0)

## 2022-09-24 ENCOUNTER — Encounter (HOSPITAL_BASED_OUTPATIENT_CLINIC_OR_DEPARTMENT_OTHER): Payer: Self-pay

## 2022-09-24 DIAGNOSIS — G4730 Sleep apnea, unspecified: Secondary | ICD-10-CM

## 2022-09-24 DIAGNOSIS — R0681 Apnea, not elsewhere classified: Secondary | ICD-10-CM

## 2022-10-08 ENCOUNTER — Other Ambulatory Visit (HOSPITAL_BASED_OUTPATIENT_CLINIC_OR_DEPARTMENT_OTHER): Payer: Self-pay

## 2022-10-09 NOTE — Progress Notes (Signed)
HPI 80 yoM never smoker followed for OSA, Hypersomnia, Irregular sleep schedule, complicated by HTN, OSA, Allergic Rhinitis, DM, Epilepsy, CKD3,  hx Prostate Cancer/ seeds, Hypercholesterolemia, Glaucoma, NPSG 01/31/13- severe obstructive sleep apnea-AHI 123.3/ hr, CPAP to 13/ AHI 0. Weight 210 lbs.  ============================================================= 03/11/22- 3 yoM never smoker followed for OSA, Irregular sleep schedule, complicated by HTN, OSA, Allergic Rhinitis, DM, Epilepsy, CKD3,  hx Prostate Cancer/ seeds, Hypercholesterolemia, Glaucoma,  CPAP auto 5-20/ [facility]   AirSense 10 AutoSet Download-compliance 97%, AHI 3.1/ hr Body weight today-181 lbs Covid vax-3 Phizer Flu vax-had Wife had called in Feb reporting that pt wears CPAP but still falls asleep frequently in day. He feels that he is not getting enough pressure on the CPAP.  Download reviewed with him. 1 cup morning coffee. Dozes off if sits, but denies formal naps. Thinks he sleeps through the night. Bedtime midnight with latency 30 minutes. No sleep meds. Denies cataplexy, sleep paralysis.  10/11/22- 75 yoM never smoker followed for OSA, Hypersomnia, Irregular sleep schedule, complicated by HTN, OSA, Allergic Rhinitis, DM, Epilepsy, CKD3,  hx Prostate Cancer/ seeds, Hypercholesterolemia, Glaucoma,  CPAP auto 5-20/ [facility]   AirSense 10 AutoSet Download-compliance 90%, AHI 4.8/ hr     (range 6.7-10.3 cwp) Body weight today- Covid vax-3 Phizer Flu vax-had Download reviewed with Mr. Shon Baton.  Compliance and control are good but recorded sleep time is consistently between 5 and 6 hours which likely is just not enough.  He has described getting drowsy enough in his chair that he almost falls over sometimes during the day.  He drinks 1 cup of coffee in the morning.  He has had a row but has not wanted to use it "because it makes me feel funky".  I suggested he try an occasional caffeine tablet.  Naps are encouraged.   ROS-see  HPI   + = positive Constitutional:    weight loss, night sweats, fevers, chills, fatigue, lassitude. HEENT:    headaches, difficulty swallowing, +tooth/dental problems, sore throat,       +sneezing, itching, ear ache, +nasal congestion, post nasal drip, snoring CV:    chest pain, orthopnea, PND, +swelling in lower extremities, anasarca,                                   dizziness, palpitations Resp:   shortness of breath with exertion or at rest.                productive cough,   non-productive cough, coughing up of blood.              change in color of mucus.  wheezing.   Skin:    rash or lesions. GI:  No-   heartburn, indigestion, abdominal pain, nausea, vomiting, diarrhea,                 change in bowel habits, loss of appetite GU: dysuria, change in color of urine, no urgency or frequency.   flank pain. MS:   +joint pain, stiffness, decreased range of motion, back pain. Neuro-     nothing unusual Psych:  change in mood or affect.  depression or anxiety.   memory loss.  OBJ- Physical Exam General- Alert, Oriented, Affect-appropriate, Distress- none acute, + obese Skin- rash-none, lesions- none, excoriation- none Lymphadenopathy- none Head- atraumatic            Eyes- Gross vision intact, PERRLA, conjunctivae and secretions clear  Ears- Hearing, canals-normal            Nose- Clear, no-Septal dev, mucus, polyps, erosion, perforation             Throat- Mallampati IV , mucosa clear , drainage- none, tonsils- atrophic Neck- flexible , trachea midline, no stridor , thyroid nl, carotid no bruit Chest - symmetrical excursion , unlabored           Heart/CV- RRR , no murmur , no gallop  , no rub, nl s1 s2                           - JVD- none , edema- none, stasis changes- none, varices- none           Lung- clear to P&A, wheeze- none, cough- none , dullness-none, rub- none           Chest wall-  Abd-  Br/ Gen/ Rectal- Not done, not indicated Extrem- cyanosis- none,  clubbing, none, atrophy- none, strength- nl Neuro- grossly intact to observation

## 2022-10-11 ENCOUNTER — Encounter: Payer: Self-pay | Admitting: Internal Medicine

## 2022-10-11 ENCOUNTER — Ambulatory Visit: Payer: Medicare Other | Admitting: Internal Medicine

## 2022-10-11 VITALS — BP 134/88 | HR 88 | Ht 63.0 in | Wt 184.4 lb

## 2022-10-11 DIAGNOSIS — G4733 Obstructive sleep apnea (adult) (pediatric): Secondary | ICD-10-CM | POA: Diagnosis not present

## 2022-10-11 DIAGNOSIS — Z72821 Inadequate sleep hygiene: Secondary | ICD-10-CM | POA: Diagnosis not present

## 2022-10-11 NOTE — Patient Instructions (Signed)
Order- DME [facility]- please replace old CPAP machine auto 5-20, mask of choice, humidifier supplies, AIrView/ card  Mr. Hinze- see if you can get a little more sleep. That may help some with the daytime drowsiness.  You can also consider taking an occasional otc caffeine tablet, like NoDoz or Vivarin.

## 2022-10-11 NOTE — Assessment & Plan Note (Signed)
He can try a caffeine tablet occasionally as discussed but is strongly encouraged to find ways to get more sleep.

## 2022-10-11 NOTE — Assessment & Plan Note (Signed)
Effects from CPAP. Plan-continue auto 5-20.  Encourage more sleep.

## 2022-10-22 ENCOUNTER — Encounter: Payer: Self-pay | Admitting: Internal Medicine

## 2022-10-23 ENCOUNTER — Ambulatory Visit (HOSPITAL_BASED_OUTPATIENT_CLINIC_OR_DEPARTMENT_OTHER): Payer: Medicare Other | Attending: Internal Medicine | Admitting: Internal Medicine

## 2022-10-23 VITALS — Ht 64.0 in | Wt 180.0 lb

## 2022-10-23 DIAGNOSIS — G4733 Obstructive sleep apnea (adult) (pediatric): Secondary | ICD-10-CM | POA: Diagnosis not present

## 2022-10-23 DIAGNOSIS — G4730 Sleep apnea, unspecified: Secondary | ICD-10-CM

## 2022-10-23 DIAGNOSIS — R0681 Apnea, not elsewhere classified: Secondary | ICD-10-CM | POA: Diagnosis present

## 2022-10-23 DIAGNOSIS — I441 Atrioventricular block, second degree: Secondary | ICD-10-CM | POA: Diagnosis not present

## 2022-10-25 ENCOUNTER — Other Ambulatory Visit (HOSPITAL_BASED_OUTPATIENT_CLINIC_OR_DEPARTMENT_OTHER): Payer: Self-pay

## 2022-10-25 MED ORDER — INSULIN DEGLUDEC 100 UNIT/ML ~~LOC~~ SOPN
10.0000 [IU] | PEN_INJECTOR | Freq: Every evening | SUBCUTANEOUS | 5 refills | Status: DC
  Filled 2022-10-25 – 2022-12-05 (×2): qty 6, 60d supply, fill #0
  Filled 2022-12-06: qty 3, 30d supply, fill #0

## 2022-10-25 MED ORDER — ROSUVASTATIN CALCIUM 10 MG PO TABS
ORAL_TABLET | ORAL | 1 refills | Status: DC
  Filled 2022-10-25: qty 90, 90d supply, fill #0
  Filled 2022-11-22: qty 30, 30d supply, fill #1
  Filled 2022-11-22: qty 90, 90d supply, fill #1

## 2022-10-26 ENCOUNTER — Other Ambulatory Visit (HOSPITAL_BASED_OUTPATIENT_CLINIC_OR_DEPARTMENT_OTHER): Payer: Self-pay

## 2022-10-30 ENCOUNTER — Ambulatory Visit: Payer: Medicare Other | Admitting: Family Medicine

## 2022-11-03 DIAGNOSIS — R0681 Apnea, not elsewhere classified: Secondary | ICD-10-CM | POA: Diagnosis not present

## 2022-11-03 NOTE — Procedures (Signed)
Patient Name: Jason Salazar, Jason Salazar Date: 10/23/2022 Gender: Male D.O.B: 03-Oct-1946 Age (years): 75 Referring Provider: Nolene Ebbs Height (inches): 64 Interpreting Physician: Baird Lyons MD, ABSM Weight (lbs): 180 RPSGT: Jorge Ny BMI: 31 MRN: [id number] Neck Size: 17.00  CLINICAL INFORMATION Sleep Study Type: Split Night CPAP Indication for sleep study: Diabetes, Fatigue, Hypertension, Obesity, Re-Evaluation, Snoring Epworth Sleepiness Score: 15  SLEEP STUDY TECHNIQUE As per the AASM Manual for the Scoring of Sleep and Associated Events v2.3 (April 2016) with a hypopnea requiring 4% desaturations.  The channels recorded and monitored were frontal, central and occipital EEG, electrooculogram (EOG), submentalis EMG (chin), nasal and oral airflow, thoracic and abdominal wall motion, anterior tibialis EMG, snore microphone, electrocardiogram, and pulse oximetry. Continuous positive airway pressure (CPAP) was initiated when the patient met split night criteria and was titrated according to treat sleep-disordered breathing.  MEDICATIONS Medications self-administered by patient taken the night of the study : COLCHICINE, DILANTIN, LUMIGAN, Tresiba  RESPIRATORY PARAMETERS Diagnostic  Total AHI (/hr): 42.8 RDI (/hr): 51.3 OA Index (/hr): 8.9 CA Index (/hr): 0.0 REM AHI (/hr): 42.4 NREM AHI (/hr): 42.9 Supine AHI (/hr): N/A Non-supine AHI (/hr): 42.8 Min O2 Sat (%): 85.0 Mean O2 (%): 93.3 Time below 88% (min): 1.1   Titration  Optimal Pressure (cm):  AHI at Optimal Pressure (/hr): N/A Min O2 at Optimal Pressure (%): 90.0 Supine % at Optimal (%): N/A Sleep % at Optimal (%): N/A   SLEEP ARCHITECTURE The recording time for the entire night was 417.4 minutes.  During a baseline period of 153.0 minutes, the patient slept for 134.5 minutes in REM and nonREM, yielding a sleep efficiency of 87.9%%. Sleep onset after lights out was 4.0 minutes with a REM latency of 74.5  minutes. The patient spent 3.3%% of the night in stage N1 sleep, 84.0%% in stage N2 sleep, 0.0%% in stage N3 and 12.6% in REM.  During the titration period of 257.7 minutes, the patient slept for 252.0 minutes in REM and nonREM, yielding a sleep efficiency of 97.8%%. Sleep onset after CPAP initiation was 1.8 minutes with a REM latency of 55.5 minutes. The patient spent 2.4%% of the night in stage N1 sleep, 76.2%% in stage N2 sleep, 0.0%% in stage N3 and 21.4% in REM.  CARDIAC DATA The 2 lead EKG demonstrated sinus rhythm. The mean heart rate was 75.1 beats per minute. Other EKG findings include: PVCs and Wenckebach AV Block.  LEG MOVEMENT DATA The total Periodic Limb Movements of Sleep (PLMS) were 0. The PLMS index was 0.0 .  IMPRESSIONS - Severe obstructive sleep apnea occurred during the diagnostic portion of the study (AHI = 42.8/hour).  - CPAP titration to 11 cwp. - No significant central sleep apnea occurred during the diagnostic portion of the study (CAI = 0.0/hour). - The patient had oxygen desaturation during the diagnostic portion of the study (Min O2 = 85.0%). Minimum O2 saturation on CPAP 11 was 92%. - The patient snored with moderate snoring volume during the diagnostic portion of the study. - EKG findings include PVCs and Wenckebach AV Block. - Clinically significant periodic limb movements did not occur during sleep.  DIAGNOSIS - Obstructive Sleep Apnea (G47.33)  RECOMMENDATIONS - Suggest CPAP titration sleep study or autopap.  - Assess cardiac rhythm. - Be careful with alcohol, sedatives and other CNS depressants that may worsen sleep apnea and disrupt normal sleep architecture. - Sleep hygiene should be reviewed to assess factors that may improve sleep quality. - Weight management and  regular exercise should be initiated or continued.  [Electronically signed] 11/03/2022 12:30 PM  Baird Lyons MD, [place], American Board of Sleep Medicine NPI: [id number]                         [place], [facility]  ELECTRONICALLY SIGNED ON:  11/03/2022, 12:21 PM [place] PH: [phone]   FX: [phone] [place]

## 2022-11-05 ENCOUNTER — Telehealth: Payer: Self-pay | Admitting: Interventional Cardiology
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
## 2022-11-06 NOTE — Telephone Encounter (Signed)
Left message for patient's wife to call back with number of doctor they would like Dr. Tamala Julian to talk to.

## 2022-11-08 NOTE — Telephone Encounter (Signed)
Patient's wife called back. Patient saw Dr. Welford Roche (EP)- [phone] Ext [phone]  and Dr. Levada Schilling (adm. Cardiologist)- [phone]. Made patient a follow-up appointment with Dr. Tamala Julian next week.

## 2022-11-12 ENCOUNTER — Other Ambulatory Visit: Payer: Self-pay | Admitting: Internal Medicine

## 2022-11-12 ENCOUNTER — Other Ambulatory Visit (HOSPITAL_BASED_OUTPATIENT_CLINIC_OR_DEPARTMENT_OTHER): Payer: Self-pay

## 2022-11-12 ENCOUNTER — Other Ambulatory Visit: Payer: Self-pay | Admitting: Family Medicine

## 2022-11-12 DIAGNOSIS — G40309 Generalized idiopathic epilepsy and epileptic syndromes, not intractable, without status epilepticus: Secondary | ICD-10-CM

## 2022-11-12 MED ORDER — DILANTIN 100 MG PO CAPS
400.0000 mg | ORAL_CAPSULE | Freq: Every day | ORAL | 4 refills | Status: DC
  Filled 2022-11-12: qty 360, 90d supply, fill #0
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
## 2022-11-13 ENCOUNTER — Encounter: Payer: Self-pay | Admitting: Interventional Cardiology

## 2022-11-13 ENCOUNTER — Ambulatory Visit: Payer: Medicare Other | Attending: Interventional Cardiology | Admitting: Interventional Cardiology

## 2022-11-13 VITALS — BP 142/66 | HR 93 | Ht 64.0 in | Wt 184.0 lb

## 2022-11-13 DIAGNOSIS — I119 Hypertensive heart disease without heart failure: Secondary | ICD-10-CM

## 2022-11-13 DIAGNOSIS — E1169 Type 2 diabetes mellitus with other specified complication: Secondary | ICD-10-CM | POA: Diagnosis not present

## 2022-11-13 DIAGNOSIS — I441 Atrioventricular block, second degree: Secondary | ICD-10-CM

## 2022-11-13 DIAGNOSIS — N1831 Chronic kidney disease, stage 3a: Secondary | ICD-10-CM

## 2022-11-13 DIAGNOSIS — R55 Syncope and collapse: Secondary | ICD-10-CM

## 2022-11-13 DIAGNOSIS — R011 Cardiac murmur, unspecified: Secondary | ICD-10-CM

## 2022-11-13 LAB — BASIC METABOLIC PANEL WITH GFR
BUN/Creatinine Ratio: 20 (calc) (ref 6–22)
BUN: 26 mg/dL — ABNORMAL HIGH (ref 7–25)
CO2: 28 mmol/L (ref 20–32)
Calcium: 9.3 mg/dL (ref 8.6–10.3)
Chloride: 98 mmol/L (ref 98–110)
Creat: 1.32 mg/dL — ABNORMAL HIGH (ref 0.70–1.28)
Glucose, Bld: 192 mg/dL — ABNORMAL HIGH (ref 65–99)
Potassium: 4.4 mmol/L (ref 3.5–5.3)
Sodium: 136 mmol/L (ref 135–146)
eGFR: 57 mL/min/{1.73_m2} — ABNORMAL LOW (ref >=60)

## 2022-11-13 LAB — CBC
HCT: 45.5 % (ref 38.5–50.0)
Hemoglobin: 15.1 g/dL (ref 13.2–17.1)
MCH: 28.5 pg (ref 27.0–33.0)
MCHC: 33.2 g/dL (ref 32.0–36.0)
MCV: 85.8 fL (ref 80.0–100.0)
MPV: 10.2 fL (ref 7.5–12.5)
Platelets: 158 10*3/uL (ref 140–400)
RBC: 5.3 10*6/uL (ref 4.20–5.80)
RDW: 12.6 % (ref 11.0–15.0)
WBC: 4.6 10*3/uL (ref 3.8–10.8)

## 2022-11-13 NOTE — Patient Instructions (Addendum)
Medication Instructions:  Your physician recommends that you continue on your current medications as directed. Please refer to the Current Medication list given to you today.  *If you need a refill on your cardiac medications before your next appointment, please call your pharmacy*  Testing/Procedures: Your physician has requested that you have an echocardiogram. Echocardiography is a painless test that uses sound waves to create images of your heart. It provides your doctor with information about the size and shape of your heart and how well your heart's chambers and valves are working. This procedure takes approximately one hour. There are no restrictions for this procedure. Please do NOT wear cologne, perfume, aftershave, or lotions (deodorant is allowed). Please arrive 15 minutes prior to your appointment time.  Follow-Up: At [facility], you and your health needs are our priority.  As part of our continuing mission to provide you with exceptional heart care, we have created designated Provider Care Teams.  These Care Teams include your primary Cardiologist (physician) and Advanced Practice Providers (APPs -  Physician Assistants and Nurse Practitioners) who all work together to provide you with the care you need, when you need it.  Your next appointment:   3 week(s) with Dr. Myles Gip on 12/04/22 (already scheduled) 4-6 months with Dr. Gasper Sells  The format for your next appointment:   In Person  Provider:   Doralee Albino, MD (electrophysiologist) Rudean Haskell, MD (cardiologist)  Other Instructions No driving for 6 months until cleared by cardiologist/electrophysiologist.   Important Information About Sugar

## 2022-11-14 ENCOUNTER — Ambulatory Visit: Payer: Medicare Other | Admitting: Family Medicine

## 2022-11-16 ENCOUNTER — Other Ambulatory Visit (HOSPITAL_BASED_OUTPATIENT_CLINIC_OR_DEPARTMENT_OTHER): Payer: Self-pay

## 2022-11-20 ENCOUNTER — Other Ambulatory Visit (HOSPITAL_BASED_OUTPATIENT_CLINIC_OR_DEPARTMENT_OTHER): Payer: Self-pay

## 2022-11-20 MED ORDER — FARXIGA 10 MG PO TABS
10.0000 mg | ORAL_TABLET | Freq: Every day | ORAL | 2 refills | Status: DC
  Filled 2022-11-20: qty 30, 30d supply, fill #0
  Filled 2022-12-18: qty 30, 30d supply, fill #1
  Filled 2023-02-12 (×2): qty 30, 30d supply, fill #2
  Filled 2023-03-21 (×2): qty 30, 30d supply, fill #3
  Filled 2023-04-20: qty 30, 30d supply, fill #4
  Filled 2023-05-21: qty 30, 30d supply, fill #5
  Filled 2023-07-04 – 2023-07-05 (×3): qty 30, 30d supply, fill #6

## 2022-11-20 MED ORDER — DILANTIN 100 MG PO CAPS
400.0000 mg | ORAL_CAPSULE | Freq: Every day | ORAL | 5 refills | Status: DC
  Filled 2022-11-20: qty 360, 90d supply, fill #0
  Filled 2023-03-01: qty 360, 90d supply, fill #1

## 2022-11-21 ENCOUNTER — Other Ambulatory Visit (HOSPITAL_BASED_OUTPATIENT_CLINIC_OR_DEPARTMENT_OTHER): Payer: Self-pay

## 2022-11-22 ENCOUNTER — Other Ambulatory Visit (HOSPITAL_BASED_OUTPATIENT_CLINIC_OR_DEPARTMENT_OTHER): Payer: Self-pay

## 2022-11-25 ENCOUNTER — Ambulatory Visit: Payer: Medicare Other | Admitting: Family Medicine

## 2022-12-04 ENCOUNTER — Ambulatory Visit: Payer: Medicare Other | Admitting: Cardiovascular Disease

## 2022-12-05 ENCOUNTER — Other Ambulatory Visit (HOSPITAL_COMMUNITY): Payer: Self-pay

## 2022-12-06 ENCOUNTER — Other Ambulatory Visit (HOSPITAL_BASED_OUTPATIENT_CLINIC_OR_DEPARTMENT_OTHER): Payer: Self-pay

## 2022-12-06 ENCOUNTER — Other Ambulatory Visit (HOSPITAL_COMMUNITY): Payer: Self-pay

## 2022-12-07 ENCOUNTER — Other Ambulatory Visit (HOSPITAL_BASED_OUTPATIENT_CLINIC_OR_DEPARTMENT_OTHER): Payer: Self-pay

## 2022-12-12 ENCOUNTER — Ambulatory Visit (HOSPITAL_COMMUNITY): Payer: Medicare Other | Attending: Internal Medicine

## 2022-12-12 DIAGNOSIS — R011 Cardiac murmur, unspecified: Secondary | ICD-10-CM | POA: Diagnosis not present

## 2022-12-12 LAB — ECHOCARDIOGRAM COMPLETE
Area-P 1/2: 3.97 cm2
S' Lateral: 2.6 cm

## 2022-12-13 ENCOUNTER — Telehealth: Payer: Self-pay | Admitting: Cardiovascular Disease

## 2022-12-13 NOTE — Telephone Encounter (Signed)
Pt calling about a call he received from [facility] care about approval for a "test" and his appt on Monday. I don't see he has a test scheduled but he would like to speak a nurse about it.

## 2022-12-16 ENCOUNTER — Ambulatory Visit: Payer: Medicare Other | Attending: Cardiovascular Disease | Admitting: Cardiovascular Disease

## 2022-12-16 ENCOUNTER — Encounter: Payer: Self-pay | Admitting: Cardiovascular Disease

## 2022-12-16 VITALS — BP 130/68 | HR 79 | Ht 64.0 in | Wt 183.0 lb

## 2022-12-16 DIAGNOSIS — R55 Syncope and collapse: Secondary | ICD-10-CM

## 2022-12-16 NOTE — Patient Instructions (Signed)
Medication Instructions:  Your physician recommends that you continue on your current medications as directed. Please refer to the Current Medication list given to you today.  *If you need a refill on your cardiac medications before your next appointment, please call your pharmacy*  Follow-Up: At [facility], you and your health needs are our priority.  As part of our continuing mission to provide you with exceptional heart care, we have created designated Provider Care Teams.  These Care Teams include your primary Cardiologist (physician) and Advanced Practice Providers (APPs -  Physician Assistants and Nurse Practitioners) who all work together to provide you with the care you need, when you need it.   Your next appointment:   2 year(s)  The format for your next appointment:   In Person  Provider:   You may see Melida Quitter, MD or one of the following Advanced Practice Providers on your designated Care Team:   Tommye Standard, [place] Legrand Como "Jonni Sanger" Chalmers Cater, [place]    Important Information About Sugar

## 2022-12-16 NOTE — Telephone Encounter (Signed)
Patient seen in clinic today. Nothing further needed.
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
## 2022-12-18 ENCOUNTER — Other Ambulatory Visit (HOSPITAL_BASED_OUTPATIENT_CLINIC_OR_DEPARTMENT_OTHER): Payer: Self-pay

## 2022-12-19 ENCOUNTER — Other Ambulatory Visit (HOSPITAL_BASED_OUTPATIENT_CLINIC_OR_DEPARTMENT_OTHER): Payer: Self-pay

## 2022-12-19 ENCOUNTER — Other Ambulatory Visit: Payer: Self-pay

## 2022-12-21 ENCOUNTER — Other Ambulatory Visit (HOSPITAL_BASED_OUTPATIENT_CLINIC_OR_DEPARTMENT_OTHER): Payer: Self-pay

## 2022-12-23 ENCOUNTER — Other Ambulatory Visit (HOSPITAL_BASED_OUTPATIENT_CLINIC_OR_DEPARTMENT_OTHER): Payer: Self-pay

## 2022-12-30 ENCOUNTER — Ambulatory Visit: Payer: Medicare Other | Attending: Cardiovascular Disease

## 2022-12-30 DIAGNOSIS — R55 Syncope and collapse: Secondary | ICD-10-CM | POA: Diagnosis not present

## 2022-12-31 ENCOUNTER — Other Ambulatory Visit: Payer: Self-pay | Admitting: Urology

## 2022-12-31 DIAGNOSIS — N281 Cyst of kidney, acquired: Secondary | ICD-10-CM

## 2023-01-01 LAB — CUP PACEART REMOTE DEVICE CHECK: Date Time Interrogation Session: 20240120000903

## 2023-02-03 ENCOUNTER — Ambulatory Visit: Payer: Medicare Other

## 2023-02-03 DIAGNOSIS — R55 Syncope and collapse: Secondary | ICD-10-CM | POA: Diagnosis not present

## 2023-02-04 LAB — CUP PACEART REMOTE DEVICE CHECK: Date Time Interrogation Session: 20240222000322

## 2023-02-06 ENCOUNTER — Other Ambulatory Visit (HOSPITAL_BASED_OUTPATIENT_CLINIC_OR_DEPARTMENT_OTHER): Payer: Self-pay

## 2023-02-06 MED ORDER — COMIRNATY 30 MCG/0.3ML IM SUSY
PREFILLED_SYRINGE | INTRAMUSCULAR | 0 refills | Status: DC
  Filled 2023-02-06: qty 0.3, 1d supply, fill #0

## 2023-02-12 ENCOUNTER — Other Ambulatory Visit (HOSPITAL_BASED_OUTPATIENT_CLINIC_OR_DEPARTMENT_OTHER): Payer: Self-pay

## 2023-02-13 ENCOUNTER — Other Ambulatory Visit (HOSPITAL_BASED_OUTPATIENT_CLINIC_OR_DEPARTMENT_OTHER): Payer: Self-pay

## 2023-02-17 NOTE — Progress Notes (Signed)
Carelink Summary Report / Loop Recorder 

## 2023-02-19 ENCOUNTER — Telehealth: Payer: Self-pay | Admitting: Cardiovascular Disease

## 2023-02-19 NOTE — Telephone Encounter (Signed)
Pt wife [name] [facility]) wanted to schedule appointment with [employer] for her husband. The patient is concerned that he will not be able to lay flat due to having a loop recorder. Patient is not currently experiencing any symptoms. Pt wife wanted a letter from MD stating patient does have limitations such as not being able to lay flat. Advised pt to  contact  [employer] to see if they have any accommodations. Then the pt wife stated she " will like to cancel the membership without paying a penalty and is hopeful with documentation from the MD  this will help". Will forward to MD and nurse.

## 2023-02-19 NOTE — Telephone Encounter (Signed)
Pt's wife would like a callback regarding getting a letter from provider stating pt can't lay down because of his monitor, per [employer] request because that's the only way they'll allow pt to cancel membership. Please advise.

## 2023-02-21 NOTE — Telephone Encounter (Signed)
Left message for patient (OK per DPR) stating we are unable to provide letter stating patient is unable to lay flat due to having a loop recorder. There are no restrictions related to having the loop recorder in place, therefore Dr. Myles Gip will not be able to provide this documentation as requested.  Provided office number for callback if any questions or concerns.

## 2023-03-01 ENCOUNTER — Other Ambulatory Visit (HOSPITAL_BASED_OUTPATIENT_CLINIC_OR_DEPARTMENT_OTHER): Payer: Self-pay

## 2023-03-03 ENCOUNTER — Other Ambulatory Visit (HOSPITAL_BASED_OUTPATIENT_CLINIC_OR_DEPARTMENT_OTHER): Payer: Self-pay

## 2023-03-04 ENCOUNTER — Encounter: Payer: Self-pay | Admitting: "Endocrinology

## 2023-03-06 ENCOUNTER — Other Ambulatory Visit: Payer: Self-pay

## 2023-03-06 LAB — CUP PACEART REMOTE DEVICE CHECK: Date Time Interrogation Session: 20240326000553

## 2023-03-07 LAB — COMPREHENSIVE METABOLIC PANEL
ALT: 36 IU/L (ref 0–44)
AST: 27 IU/L (ref 0–40)
Albumin/Globulin Ratio: 1.6 (ref 1.2–2.2)
Albumin: 4.1 g/dL (ref 3.8–4.8)
Alkaline Phosphatase: 137 IU/L — ABNORMAL HIGH (ref 44–121)
BUN/Creatinine Ratio: 18 (ref 10–24)
BUN: 27 mg/dL (ref 8–27)
Bilirubin Total: 0.2 mg/dL (ref 0.0–1.2)
CO2: 23 mmol/L (ref 20–29)
Calcium: 8.8 mg/dL (ref 8.6–10.2)
Chloride: 100 mmol/L (ref 96–106)
Creatinine, Ser: 1.49 mg/dL — ABNORMAL HIGH (ref 0.76–1.27)
Globulin, Total: 2.6 g/dL (ref 1.5–4.5)
Glucose: 111 mg/dL — ABNORMAL HIGH (ref 70–99)
Potassium: 4.8 mmol/L (ref 3.5–5.2)
Sodium: 138 mmol/L (ref 134–144)
Total Protein: 6.7 g/dL (ref 6.0–8.5)
eGFR: 49 mL/min/{1.73_m2} — ABNORMAL LOW (ref >59)

## 2023-03-07 LAB — FOLATE: Folate: 18 ng/mL (ref >3.0)

## 2023-03-07 LAB — LIPID PANEL W/O CHOL/HDL RATIO
Cholesterol, Total: 222 mg/dL — ABNORMAL HIGH (ref 100–199)
HDL: 64 mg/dL (ref >39)
LDL Chol Calc (NIH): 142 mg/dL — ABNORMAL HIGH (ref 0–99)
Triglycerides: 92 mg/dL (ref 0–149)
VLDL Cholesterol Cal: 16 mg/dL (ref 5–40)

## 2023-03-07 LAB — VITAMIN B12: Vitamin B-12: 389 pg/mL (ref 232–1245)

## 2023-03-07 LAB — TSH: TSH: 2.06 u[IU]/mL (ref 0.450–4.500)

## 2023-03-07 LAB — SPECIMEN STATUS REPORT

## 2023-03-10 ENCOUNTER — Ambulatory Visit (INDEPENDENT_AMBULATORY_CARE_PROVIDER_SITE_OTHER): Payer: Medicare Other

## 2023-03-10 DIAGNOSIS — R55 Syncope and collapse: Secondary | ICD-10-CM

## 2023-03-17 NOTE — Progress Notes (Signed)
Carelink Summary Report / Loop Recorder 

## 2023-03-17 NOTE — Progress Notes (Signed)
Cardiology Office Note:    Date:  03/18/2023   ID:  Jason Salazar, DOB [birth year], MRN [id number]  PCP:  Fleet Contras, MD  Cardiologist:  Christell Constant, MD   Referring MD: Fleet Contras, MD   CC: Transition to new caridologist  History of Present Illness:    Jason Salazar is a 77 y.o. 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  2023: had syncope.  S/p ILR.  Dr. Katrinka Blazing had wanted to do testing for cardiac sarcoidosis. . Patient notes that he is doing OK.   Since last visit notes that he spends most of his time around the house . There are no interval hospital/ED visit.    No chest pain or pressure .  No SOB/DOE and no PND/Orthopnea.  No weight gain or leg swelling.  No palpitations or syncope.  Notes constipation on the lasix.  He takes it 3 X week on average.   Past Medical History:  Diagnosis Date   Allergic rhinitis    Balantidiasis    Cancer    prostate cancer treated 2007 with radiation seed placement   Diabetes    H/O prostate cancer    Radioactive seeds   Hypercholesteremia    Hyperlipidemia    Hyperlipoproteinemia    Hypertension    Sleep apnea     Past Surgical History:  Procedure Laterality Date   cyst removed from right kidney      Current Medications: Current Meds  Medication Sig   allopurinol (ZYLOPRIM) 100 MG tablet Take 100 mg by mouth 2 (two) times daily.   aspirin EC 81 MG tablet Take 81 mg by mouth daily.   b complex vitamins tablet Take 1 tablet by mouth daily.   bimatoprost (LUMIGAN) 0.01 % SOLN Instill 1 drop into both eyes at bedtime   Chlorphen-PE-Acetaminophen 4-10-325 MG TABS Take 1 tablet by mouth 2 (two) times daily as needed.   cloNIDine (CATAPRES) 0.1 MG tablet Take 0.1 mg by mouth daily.   Colchicine 0.6 MG CAPS Take 1 capsule by mouth 2 (two) times daily as needed for  gout flare up   [facility] MELATONIN 5 MG TABS as needed (sleep).   dapagliflozin propanediol (FARXIGA) 10 MG TABS tablet Take 1 tablet (10 mg total) by mouth daily.   diclofenac Sodium (VOLTAREN) 1 % GEL Apply 4 g topically 4 (four) times daily as needed for pain   DILANTIN 100 MG ER capsule Take 4 capsules (400 mg total) by mouth at bedtime.   ezetimibe (ZETIA) 10 MG tablet Take 10 mg by mouth daily.   fluticasone (FLONASE) 50 MCG/ACT nasal spray Place 1 spray into both nostrils as needed.    furosemide (LASIX) 20 MG tablet Take 1 tablet (20 mg total) by mouth daily.   gabapentin (NEURONTIN) 300 MG capsule Take 1 capsule (300 mg total) by mouth at bedtime. (Patient taking differently: Take 300 mg by mouth as needed.)   glucose blood test strip Use for once daily glucose testing (Dx: E11.9)   insulin degludec (TRESIBA) 100 UNIT/ML FlexTouch Pen Inject 10 Units into the skin every evening.   Insulin Pen Needle 31G X 5 MM MISC Use for insulin injection 3 times a day   Lancets (ONETOUCH DELICA PLUS LANCET33G) MISC Use for once daily glucose testing (Dx: E11.9)   losartan (COZAAR) 25 MG tablet Take 25 mg by mouth daily.  metoCLOPramide (REGLAN) 5 MG tablet Take 5 mg by mouth 3 (three) times daily as needed.   mometasone (NASONEX) 50 MCG/ACT nasal spray 2 sprays as needed.   Multiple Vitamins-Minerals (ONE-A-DAY MENS 50+ ADVANTAGE PO) Take 1 tablet by mouth daily.   ONETOUCH VERIO test strip SMARTSIG:Strip(s) Via Meter Daily   predniSONE (DELTASONE) 20 MG tablet Take by mouth as needed (pain).   rosuvastatin (CRESTOR) 10 MG tablet Take 1 tablet by mouth once daily.   [DISCONTINUED] LASIX 20 MG tablet Take 20 mg by mouth as needed for fluid or edema.     Allergies:   Atorvastatin   Social History   Socioeconomic History   Marital status: Married    Spouse name: Lanora Manis   Number of children: 1   Years of education: HS   Highest education level: Not on file  Occupational History    Employer:  [employer]  Tobacco Use   Smoking status: Never   Smokeless tobacco: Never  Vaping Use   Vaping Use: Never used  Substance and Sexual Activity   Alcohol use: No   Drug use: No   Sexual activity: Not on file  Other Topics Concern   Not on file  Social History Narrative   Patient is married to [place] and has one son.   Caffeine Use: none; quit a few months ago             Social Determinants of Corporate investment banker Strain: Not on file  Food Insecurity: Not on file  Transportation Needs: Not on file  Physical Activity: Not on file  Stress: Not on file  Social Connections: Not on file     Family History: The patient's family history includes CVA in his mother; Prostate cancer in his father; Sleep apnea in his brother; Thyroid cancer in his father.  ROS:   Please see the history of present illness.    No other complaints all other systems reviewed and are negative.  EKGs/Labs/Other Studies Reviewed:     EKG:   2023: biatrial abnormality, first-degree AV block with PR 238 ms, diffuse ST-T wave abnormality, with relatively prominent voltage raising question of LVH.  Cardiac Studies & Procedures       ECHOCARDIOGRAM  ECHOCARDIOGRAM COMPLETE 12/12/2022  Narrative ECHOCARDIOGRAM REPORT    Patient Name:   Jason Salazar Date of Exam: 12/12/2022 Medical Rec #:  [id number]       Height:       64.0 in Accession #:    [id number]      Weight:       184.0 lb Date of Birth:  Mar 15, 1946      BSA:          1.888 m Patient Age:    76 years        BP:           156/90 mmHg Patient Gender: M               HR:           76 bpm. Exam Location:  [address]  Procedure: 2D Echo, 3D Echo, Cardiac Doppler and Color Doppler  Indications:    R01.1 Murmur  History:        Patient has prior history of Echocardiogram examinations, most recent 08/23/2020. Arrythmias:AV Block, Mobitz 1, Signs/Symptoms:Syncope; Risk Factors:Hypertension, Sleep Apnea, HLD, CKD and  Diabetes.  Sonographer:    Clearence Ped RCS Referring Phys: [id number] Barry Dienes [facility]  IMPRESSIONS   1.  Left ventricular ejection fraction, by estimation, is 60 to 65%. The left ventricle has normal function. The left ventricle has no regional wall motion abnormalities. There is mild concentric left ventricular hypertrophy. Left ventricular diastolic parameters are consistent with Grade I diastolic dysfunction (impaired relaxation). 2. Right ventricular systolic function is normal. The right ventricular size is normal. Tricuspid regurgitation signal is inadequate for assessing PA pressure. 3. No evidence of mitral valve regurgitation. 4. Aortic valve regurgitation is not visualized. Aortic valve sclerosis is present, with no evidence of aortic valve stenosis. 5. The inferior vena cava is normal in size with greater than 50% respiratory variability, suggesting right atrial pressure of 3 mmHg.  Comparison(s): No significant change from prior study.  FINDINGS Left Ventricle: Left ventricular ejection fraction, by estimation, is 60 to 65%. The left ventricle has normal function. The left ventricle has no regional wall motion abnormalities. The left ventricular internal cavity size was normal in size. There is mild concentric left ventricular hypertrophy. Left ventricular diastolic parameters are consistent with Grade I diastolic dysfunction (impaired relaxation).  Right Ventricle: The right ventricular size is normal. Right ventricular systolic function is normal. Tricuspid regurgitation signal is inadequate for assessing PA pressure.  Left Atrium: Left atrial size was normal in size.  Right Atrium: Right atrial size was normal in size.  Pericardium: There is no evidence of pericardial effusion.  Mitral Valve: No evidence of mitral valve regurgitation.  Tricuspid Valve: Tricuspid valve regurgitation is not demonstrated.  Aortic Valve: Aortic valve regurgitation is not visualized. Aortic valve  sclerosis is present, with no evidence of aortic valve stenosis.  Pulmonic Valve: Pulmonic valve regurgitation is not visualized.  Venous: The inferior vena cava is normal in size with greater than 50% respiratory variability, suggesting right atrial pressure of 3 mmHg.  IAS/Shunts: No atrial level shunt detected by color flow Doppler.   LEFT VENTRICLE PLAX 2D LVIDd:         4.20 cm   Diastology LVIDs:         2.60 cm   LV e' medial:    8.38 cm/s LV PW:         1.00 cm   LV E/e' medial:  10.8 LV IVS:        1.00 cm   LV e' lateral:   6.20 cm/s LVOT diam:     1.80 cm   LV E/e' lateral: 14.6 LV SV:         41 LV SV Index:   22 LVOT Area:     2.54 cm  3D Volume EF: 3D EF:        55 % LV EDV:       89 ml LV ESV:       40 ml LV SV:        49 ml  RIGHT VENTRICLE RV Basal diam:  3.10 cm RV S prime:     15.40 cm/s TAPSE (M-mode): 1.9 cm  LEFT ATRIUM             Index        RIGHT ATRIUM          Index LA diam:        3.30 cm 1.75 cm/m   RA Area:     8.62 cm LA Vol (A2C):   58.5 ml 30.98 ml/m  RA Volume:   14.30 ml 7.57 ml/m LA Vol (A4C):   44.1 ml 23.35 ml/m LA Biplane Vol: 51.2 ml 27.11 ml/m AORTIC VALVE LVOT  Vmax:   86.20 cm/s LVOT Vmean:  60.500 cm/s LVOT VTI:    0.163 m  AORTA Ao Root diam: 3.10 cm Ao Asc diam:  3.00 cm  MITRAL VALVE MV Area (PHT):             SHUNTS MV Decel Time:             Systemic VTI:  0.16 m MV E velocity: 90.70 cm/s  Systemic Diam: 1.80 cm MV A velocity: 98.00 cm/s MV E/A ratio:  0.93  Photographer signed by Carolan Clines Signature Date/Time: 12/12/2022/9:54:11 AM    Final    MONITORS  CARDIAC EVENT MONITOR 09/29/2020  Narrative  Normal sinus rhythm with occasional 1st degree AV block  Occasional Mobitz 1 Second degree AV block during sleep  No arrhythmia noted with complaints.            Recent Labs: 11/12/2022: Hemoglobin 15.1; Platelets 158 03/06/2023: ALT 36; BUN 27; Creatinine, Ser 1.49; Potassium  4.8; Sodium 138; TSH 2.060  Recent Lipid Panel    Component Value Date/Time   CHOL 222 (H) 03/06/2023 0802   TRIG 92 03/06/2023 0802   HDL 64 03/06/2023 0802   CHOLHDL 3.2 03/19/2021 0913   LDLCALC 142 (H) 03/06/2023 0802   LDLCALC 83 03/19/2021 0913    Physical Exam:    VS:  BP 130/60   Pulse 80   Ht 5\' 3"  (1.6 m)   Wt 183 lb (83 kg)   SpO2 98%   BMI 32.42 kg/m     Wt Readings from Last 3 Encounters:  03/18/23 183 lb (83 kg)  12/16/22 183 lb (83 kg)  11/13/22 184 lb (83.5 kg)     GEN: No acute distress HEENT: Normal CARDIAC: Soft systolic murmur, RRR, and +1 edema VASCULAR:  Normal Pulses.  RESPIRATORY:  Clear to auscultation without rales, wheezing or rhonchi  ABDOMEN: Soft, non-tenderly distended MUSCULOSKELETAL: No deformity  SKIN: Warm and dry NEUROLOGIC:  Alert and oriented x 3 PSYCHIATRIC:  Normal affect   ASSESSMENT:    1. Hypercholesteremia   2. AV block, Mobitz 1   3. Hypertensive heart disease without heart failure   4. Obstructive sleep apnea     PLAN:    Mobitz Type I HB Biatrial enlargement LVH - planned for CMR by Dr. Katrinka Blazing to r/o infiltrative disease  - s/p ILR  LE - change to lasix 20 mg PO daily, BMP in 1-2 weeks  HTN OSA - continue CPAP - on clonidine and BP controlled - continue ARB  HLD with DM - LDL is above goal rosuvastatin on current therapy - set LDL goal < 70 - He will start walking and a plant based diet, if above goal this fall, will increase statin, will do CAC score largely   Fall follow up with me and/or team   Medication Adjustments/Labs and Tests Ordered: Current medicines are reviewed at length with the patient today.  Concerns regarding medicines are outlined above.  Orders Placed This Encounter  Procedures   MR CARDIAC MORPHOLOGY W WO CONTRAST   Basic metabolic panel   CBC   Meds ordered this encounter  Medications   furosemide (LASIX) 20 MG tablet    Sig: Take 1 tablet (20 mg total) by mouth  daily.    Dispense:  90 tablet    Refill:  3    Patient Instructions  Medication Instructions:  Your physician has recommended you make the following change in your medication:  TAKE: furosemide (  Lasix) 20 mg by mouth once daily  *If you need a refill on your cardiac medications before your next appointment, please call your pharmacy*   Lab Work: IN 2 WEEKS: CBC, BMP  If you have labs (blood work) drawn today and your tests are completely normal, you will receive your results only by: MyChart Message (if you have MyChart) OR A paper copy in the mail If you have any lab test that is abnormal or we need to change your treatment, we will call you to review the results.   Testing/Procedures: Your physician has requested that you have a cardiac MRI. Cardiac MRI uses a computer to create images of your heart as its beating, producing both still and moving pictures of your heart and major blood vessels. For further information please visit [email or web address]. Please follow the instruction sheet given to you today for more information.    Follow-Up: At [facility], you and your health needs are our priority.  As part of our continuing mission to provide you with exceptional heart care, we have created designated Provider Care Teams.  These Care Teams include your primary Cardiologist (physician) and Advanced Practice Providers (APPs -  Physician Assistants and Nurse Practitioners) who all work together to provide you with the care you need, when you need it.   Your next appointment:   6 month(s)  Provider:   Christell ConstantMahesh A Megen Madewell, MD     OTHER INSTRUCTIONS   You are scheduled for Cardiac MRI on ______________. Please arrive for your appointment at ______________ ( arrive 30-45 minutes prior to test start time). ?  [facility] [address] [place], [place] [address] [phone] Please take advantage of the free valet parking available at the MAIN  entrance (A entrance).  Proceed to the [facility] Radiology Department (First Floor) for check-in.   OR   [facility] [address] [place], [place] [address] [phone] Please take advantage of the free valet parking available at the MAIN entrance. Proceed to [facility] registration for check-in (first floor).  Magnetic resonance imaging (MRI) is a painless test that produces images of the inside of the body without using Xrays.  During an MRI, strong magnets and radio waves work together in a Data processing managermagnetic field to form detailed images.   MRI images may provide more details about a medical condition than X-rays, CT scans, and ultrasounds can provide.  You may be given earphones to listen for instructions.  You may eat a light breakfast and take medications as ordered with the exception of furosemide, hydrochlorothiazide, or spironolactone(fluid pill, other). Please avoid stimulants for 12 hr prior to test. (Ie. Caffeine, nicotine, chocolate, or antihistamine medications)  If a contrast material will be used, an IV will be inserted into one of your veins. Contrast material will be injected into your IV. It will leave your body through your urine within a day. You may be told to drink plenty of fluids to help flush the contrast material out of your system.  You will be asked to remove all metal, including: Watch, jewelry, and other metal objects including hearing aids, hair pieces and dentures. Also wearable glucose monitoring systems (ie. Freestyle Libre and Omnipods) (Braces and fillings normally are not a problem.)   TEST WILL TAKE APPROXIMATELY 1 HOUR  PLEASE NOTIFY SCHEDULING AT LEAST 24 HOURS IN ADVANCE IF YOU ARE UNABLE TO KEEP YOUR APPOINTMENT. [phone]  Please call [place] AlexandriaSara Wallace, cardiac imaging nurse navigator with any questions/concerns. [place] DecSara  Earlene Plater RN Navigator Cardiac Imaging Larey Brick RN Navigator Cardiac Imaging [facility] [phone] Office      Signed, Christell Constant, MD  03/18/2023 9:34 AM    [facility]

## 2023-03-18 ENCOUNTER — Other Ambulatory Visit (HOSPITAL_BASED_OUTPATIENT_CLINIC_OR_DEPARTMENT_OTHER): Payer: Self-pay

## 2023-03-18 ENCOUNTER — Encounter: Payer: Self-pay | Admitting: Internal Medicine

## 2023-03-18 ENCOUNTER — Ambulatory Visit: Payer: Medicare Other | Attending: Internal Medicine | Admitting: Internal Medicine

## 2023-03-18 VITALS — BP 130/60 | HR 80 | Ht 63.0 in | Wt 183.0 lb

## 2023-03-18 DIAGNOSIS — E7800 Pure hypercholesterolemia, unspecified: Secondary | ICD-10-CM | POA: Diagnosis not present

## 2023-03-18 DIAGNOSIS — I441 Atrioventricular block, second degree: Secondary | ICD-10-CM

## 2023-03-18 DIAGNOSIS — G4733 Obstructive sleep apnea (adult) (pediatric): Secondary | ICD-10-CM

## 2023-03-18 DIAGNOSIS — I119 Hypertensive heart disease without heart failure: Secondary | ICD-10-CM

## 2023-03-18 MED ORDER — FUROSEMIDE 20 MG PO TABS
20.0000 mg | ORAL_TABLET | Freq: Every day | ORAL | 3 refills | Status: AC
  Filled 2023-03-18: qty 90, 90d supply, fill #0

## 2023-03-18 NOTE — Patient Instructions (Addendum)
Medication Instructions:  Your physician has recommended you make the following change in your medication:  TAKE: furosemide (Lasix) 20 mg by mouth once daily  *If you need a refill on your cardiac medications before your next appointment, please call your pharmacy*   Lab Work: IN 2 WEEKS: CBC, BMP  If you have labs (blood work) drawn today and your tests are completely normal, you will receive your results only by: MyChart Message (if you have MyChart) OR A paper copy in the mail If you have any lab test that is abnormal or we need to change your treatment, we will call you to review the results.   Testing/Procedures: Your physician has requested that you have a cardiac MRI. Cardiac MRI uses a computer to create images of your heart as its beating, producing both still and moving pictures of your heart and major blood vessels. For further information please visit [email or web address]. Please follow the instruction sheet given to you today for more information.    Follow-Up: At [facility], you and your health needs are our priority.  As part of our continuing mission to provide you with exceptional heart care, we have created designated Provider Care Teams.  These Care Teams include your primary Cardiologist (physician) and Advanced Practice Providers (APPs -  Physician Assistants and Nurse Practitioners) who all work together to provide you with the care you need, when you need it.   Your next appointment:   6 month(s)  Provider:   Christell Constant, MD     OTHER INSTRUCTIONS   You are scheduled for Cardiac MRI on ______________. Please arrive for your appointment at ______________ ( arrive 30-45 minutes prior to test start time). ?  [facility] [address] [place], [place] [address] [phone] Please take advantage of the free valet parking available at the MAIN entrance (A entrance).  Proceed to the [facility] Radiology Department (First  Floor) for check-in.   OR   [facility] [address] [place], [place] [address] [phone] Please take advantage of the free valet parking available at the MAIN entrance. Proceed to [facility] registration for check-in (first floor).  Magnetic resonance imaging (MRI) is a painless test that produces images of the inside of the body without using Xrays.  During an MRI, strong magnets and radio waves work together in a Data processing manager to form detailed images.   MRI images may provide more details about a medical condition than X-rays, CT scans, and ultrasounds can provide.  You may be given earphones to listen for instructions.  You may eat a light breakfast and take medications as ordered with the exception of furosemide, hydrochlorothiazide, or spironolactone(fluid pill, other). Please avoid stimulants for 12 hr prior to test. (Ie. Caffeine, nicotine, chocolate, or antihistamine medications)  If a contrast material will be used, an IV will be inserted into one of your veins. Contrast material will be injected into your IV. It will leave your body through your urine within a day. You may be told to drink plenty of fluids to help flush the contrast material out of your system.  You will be asked to remove all metal, including: Watch, jewelry, and other metal objects including hearing aids, hair pieces and dentures. Also wearable glucose monitoring systems (ie. Freestyle Libre and Omnipods) (Braces and fillings normally are not a problem.)   TEST WILL TAKE APPROXIMATELY 1 HOUR  PLEASE NOTIFY SCHEDULING AT LEAST 24 HOURS IN ADVANCE IF YOU ARE UNABLE  TO KEEP YOUR APPOINTMENT. [phone]  Please call Rockwell Alexandria, cardiac imaging nurse navigator with any questions/concerns. Rockwell Alexandria RN Navigator Cardiac Imaging Larey Brick RN Navigator Cardiac Imaging [facility] [phone] Office

## 2023-03-19 ENCOUNTER — Ambulatory Visit
Admission: RE | Admit: 2023-03-19 | Discharge: 2023-03-19 | Disposition: A | Discharge status: Home / Self Care | Payer: Medicare Other | Source: Ambulatory Visit | Attending: Urology | Admitting: Urology

## 2023-03-19 DIAGNOSIS — N281 Cyst of kidney, acquired: Secondary | ICD-10-CM

## 2023-03-19 MED ORDER — GADOPICLENOL 0.5 MMOL/ML IV SOLN
8.0000 mL | Freq: Once | INTRAVENOUS | Status: AC | PRN
  Administered 2023-03-19: 8 mL via INTRAVENOUS

## 2023-03-21 ENCOUNTER — Other Ambulatory Visit (HOSPITAL_BASED_OUTPATIENT_CLINIC_OR_DEPARTMENT_OTHER): Payer: Self-pay

## 2023-03-24 ENCOUNTER — Other Ambulatory Visit (HOSPITAL_BASED_OUTPATIENT_CLINIC_OR_DEPARTMENT_OTHER): Payer: Self-pay

## 2023-04-04 ENCOUNTER — Other Ambulatory Visit (HOSPITAL_BASED_OUTPATIENT_CLINIC_OR_DEPARTMENT_OTHER): Payer: Self-pay

## 2023-04-04 MED ORDER — ALLOPURINOL 100 MG PO TABS
200.0000 mg | ORAL_TABLET | Freq: Every day | ORAL | 2 refills | Status: DC
  Filled 2023-04-04: qty 180, 90d supply, fill #0

## 2023-04-09 ENCOUNTER — Ambulatory Visit (INDEPENDENT_AMBULATORY_CARE_PROVIDER_SITE_OTHER): Payer: Medicare Other

## 2023-04-09 DIAGNOSIS — I441 Atrioventricular block, second degree: Secondary | ICD-10-CM | POA: Diagnosis not present

## 2023-04-09 LAB — CUP PACEART REMOTE DEVICE CHECK: Date Time Interrogation Session: 20240428000228

## 2023-04-14 ENCOUNTER — Ambulatory Visit: Payer: Medicare Other

## 2023-04-20 ENCOUNTER — Other Ambulatory Visit (HOSPITAL_BASED_OUTPATIENT_CLINIC_OR_DEPARTMENT_OTHER): Payer: Self-pay

## 2023-04-21 ENCOUNTER — Other Ambulatory Visit (HOSPITAL_BASED_OUTPATIENT_CLINIC_OR_DEPARTMENT_OTHER): Payer: Self-pay

## 2023-04-21 NOTE — Progress Notes (Signed)
Carelink Summary Report / Loop Recorder 

## 2023-04-30 NOTE — Progress Notes (Signed)
Carelink Summary Report / Loop Recorder 

## 2023-05-02 ENCOUNTER — Ambulatory Visit: Payer: Medicare Other | Admitting: "Endocrinology

## 2023-05-02 ENCOUNTER — Encounter: Payer: Self-pay | Admitting: "Endocrinology

## 2023-05-02 VITALS — BP 124/68 | HR 76 | Ht 64.0 in | Wt 186.2 lb

## 2023-05-02 DIAGNOSIS — I10 Essential (primary) hypertension: Secondary | ICD-10-CM

## 2023-05-02 DIAGNOSIS — E1122 Type 2 diabetes mellitus with diabetic chronic kidney disease: Secondary | ICD-10-CM

## 2023-05-02 DIAGNOSIS — Z794 Long term (current) use of insulin: Secondary | ICD-10-CM

## 2023-05-02 DIAGNOSIS — E782 Mixed hyperlipidemia: Secondary | ICD-10-CM

## 2023-05-02 DIAGNOSIS — N1831 Chronic kidney disease, stage 3a: Secondary | ICD-10-CM | POA: Diagnosis not present

## 2023-05-02 MED ORDER — INSULIN DEGLUDEC 100 UNIT/ML ~~LOC~~ SOPN: 40.0000 [IU] | PEN_INJECTOR | Freq: Every day | SUBCUTANEOUS | 1 refills | Status: DC

## 2023-05-02 NOTE — Patient Instructions (Signed)
                                     Advice for Weight Management  -For most of us the best way to lose weight is by diet management. Generally speaking, diet management means consuming less calories intentionally which over time brings about progressive weight loss.  This can be achieved more effectively by avoiding ultra processed carbohydrates, processed meats, unhealthy fats.    It is critically important to know your numbers: how much calorie you are consuming and how much calorie you need. More importantly, our carbohydrates sources should be unprocessed naturally occurring  complex starch food items.  It is always important to balance nutrition also by  appropriate intake of proteins (mainly plant-based), healthy fats/oils, plenty of fruits and vegetables.   -The American College of Lifestyle Medicine (ACL M) recommends nutrition derived mostly from Whole Food, Plant Predominant Sources example an apple instead of applesauce or apple pie. Eat Plenty of vegetables, Mushrooms, fruits, Legumes, Whole Grains, Nuts, seeds in lieu of processed meats, processed snacks/pastries red meat, poultry, eggs.  Use only water or unsweetened tea for hydration.  The College also recommends the need to stay away from risky substances including alcohol, smoking; obtaining 7-9 hours of restorative sleep, at least 150 minutes of moderate intensity exercise weekly, importance of healthy social connections, and being mindful of stress and seek help when it is overwhelming.    -Sticking to a routine mealtime to eat 3 meals a day and avoiding unnecessary snacks is shown to have a big role in weight control. Under normal circumstances, the only time we burn stored energy is when we are hungry, so allow  some hunger to take place- hunger means no food between appropriate meal times, only water.  It is not advisable to starve.   -It is better to avoid simple carbohydrates including:  Cakes, Sweet Desserts, Ice Cream, Soda (diet and regular), Sweet Tea, Candies, Chips, Cookies, Store Bought Juices, Alcohol in Excess of  1-2 drinks a day, Lemonade,  Artificial Sweeteners, Doughnuts, Coffee Creamers, "Sugar-free" Products, etc, etc.  This is not a complete list.....    -Consulting with certified diabetes educators is proven to provide you with the most accurate and current information on diet.  Also, you may be  interested in discussing diet options/exchanges , we can schedule a visit with Jason Salazar, RDN, CDE for individualized nutrition education.  -Exercise: If you are able: 30 -60 minutes a day ,4 days a week, or 150 minutes of moderate intensity exercise weekly.    The longer the better if tolerated.  Combine stretch, strength, and aerobic activities.  If you were told in the past that you have high risk for cardiovascular diseases, or if you are currently symptomatic, you may seek evaluation by your heart doctor prior to initiating moderate to intense exercise programs.                                  Additional Care Considerations for Diabetes/Prediabetes   -Diabetes  is a chronic disease.  The most important care consideration is regular follow-up with your diabetes care provider with the goal being avoiding or delaying its complications and to take advantage of advances in medications and technology.  If appropriate actions are taken early enough, type 2 diabetes can even be   reversed.  Seek information from the right source.  - Whole Food, Plant Predominant Nutrition is highly recommended: Eat Plenty of vegetables, Mushrooms, fruits, Legumes, Whole Grains, Nuts, seeds in lieu of processed meats, processed snacks/pastries red meat, poultry, eggs as recommended by American College of  Lifestyle Medicine (ACLM).  -Type 2 diabetes is known to coexist with other important comorbidities such as high blood pressure and high cholesterol.  It is critical to control not only the  diabetes but also the high blood pressure and high cholesterol to minimize and delay the risk of complications including coronary artery disease, stroke, amputations, blindness, etc.  The good news is that this diet recommendation for type 2 diabetes is also very helpful for managing high cholesterol and high blood blood pressure.  - Studies showed that people with diabetes will benefit from a class of medications known as ACE inhibitors and statins.  Unless there are specific reasons not to be on these medications, the standard of care is to consider getting one from these groups of medications at an optimal doses.  These medications are generally considered safe and proven to help protect the heart and the kidneys.    - People with diabetes are encouraged to initiate and maintain regular follow-up with eye doctors, foot doctors, dentists , and if necessary heart and kidney doctors.     - It is highly recommended that people with diabetes quit smoking or stay away from smoking, and get yearly  flu vaccine and pneumonia vaccine at least every 5 years.  See above for additional recommendations on exercise, sleep, stress management , and healthy social connections.      

## 2023-05-02 NOTE — Progress Notes (Signed)
Endocrinology Consult Note       05/02/2023, 9:36 AM   Subjective:    Patient ID: Jason Salazar, male    DOB: [birth year].  Jason Salazar is being seen in consultation for management of currently uncontrolled symptomatic diabetes requested by  Fleet Contras, MD.   Past Medical History:  Diagnosis Date   Allergic rhinitis    Balantidiasis    Cancer [facility])    prostate cancer treated 2007 with radiation seed placement   Diabetes (HCC)    H/O prostate cancer    Radioactive seeds   Hypercholesteremia    Hyperlipidemia    Hyperlipoproteinemia    Hypertension    Sleep apnea     Past Surgical History:  Procedure Laterality Date   cyst removed from right kidney      Social History   Socioeconomic History   Marital status: Married    Spouse name: Lanora Manis   Number of children: 1   Years of education: HS   Highest education level: Not on file  Occupational History    Employer: [employer]  Tobacco Use   Smoking status: Never   Smokeless tobacco: Never  Vaping Use   Vaping Use: Never used  Substance and Sexual Activity   Alcohol use: No   Drug use: No   Sexual activity: Not on file  Other Topics Concern   Not on file  Social History Narrative   Patient is married to [place] and has one son.   Caffeine Use: none; quit a few months ago             Social Determinants of Corporate investment banker Strain: Not on file  Food Insecurity: Not on file  Transportation Needs: Not on file  Physical Activity: Not on file  Stress: Not on file  Social Connections: Not on file    Family History  Problem Relation Age of Onset   CVA Mother    Sleep apnea Brother    Thyroid cancer Father    Prostate cancer Father     Outpatient Encounter Medications as of 05/02/2023  Medication Sig   loratadine (CLARITIN) 10 MG tablet Take 10 mg by mouth daily.   tiZANidine (ZANAFLEX) 4 MG  tablet Take 4 mg by mouth 2 (two) times daily as needed.   allopurinol (ZYLOPRIM) 100 MG tablet Take 100 mg by mouth 2 (two) times daily.   allopurinol (ZYLOPRIM) 100 MG tablet Take 2 tablets (200 mg total) by mouth daily.   aspirin EC 81 MG tablet Take 81 mg by mouth daily.   b complex vitamins tablet Take 1 tablet by mouth daily.   bimatoprost (LUMIGAN) 0.01 % SOLN Instill 1 drop into both eyes at bedtime   Chlorphen-PE-Acetaminophen 4-10-325 MG TABS Take 1 tablet by mouth 2 (two) times daily as needed.   cloNIDine (CATAPRES) 0.1 MG tablet Take 0.1 mg by mouth daily.   Colchicine 0.6 MG CAPS Take 1 capsule by mouth 2 (two) times daily as needed for gout flare up   dapagliflozin propanediol (FARXIGA) 10 MG TABS tablet Take 1 tablet (10 mg total) by mouth daily.   diclofenac  Sodium (VOLTAREN) 1 % GEL Apply 4 g topically 4 (four) times daily as needed for pain (Patient not taking: Reported on 05/02/2023)   DILANTIN 100 MG ER capsule Take 4 capsules (400 mg total) by mouth at bedtime.   ezetimibe (ZETIA) 10 MG tablet Take 10 mg by mouth daily.   fluticasone (FLONASE) 50 MCG/ACT nasal spray Place 1 spray into both nostrils as needed.    furosemide (LASIX) 20 MG tablet Take 1 tablet (20 mg total) by mouth daily.   gabapentin (NEURONTIN) 300 MG capsule Take 1 capsule (300 mg total) by mouth at bedtime. (Patient taking differently: Take 300 mg by mouth as needed.)   glucose blood test strip Use for once daily glucose testing (Dx: E11.9)   insulin degludec (TRESIBA) 100 UNIT/ML FlexTouch Pen Inject 40 Units into the skin at bedtime.   Insulin Pen Needle 31G X 5 MM MISC Use for insulin injection 3 times a day   Lancets (ONETOUCH DELICA PLUS LANCET33G) MISC Use for once daily glucose testing (Dx: E11.9)   losartan (COZAAR) 25 MG tablet Take 25 mg by mouth daily.   metoCLOPramide (REGLAN) 5 MG tablet Take 5 mg by mouth 3 (three) times daily as needed. (Patient not taking: Reported on 05/02/2023)    mometasone (NASONEX) 50 MCG/ACT nasal spray 2 sprays as needed.   Multiple Vitamins-Minerals (ONE-A-DAY MENS 50+ ADVANTAGE PO) Take 1 tablet by mouth daily.   ONETOUCH VERIO test strip SMARTSIG:Strip(s) Via Meter Daily   predniSONE (DELTASONE) 20 MG tablet Take by mouth as needed (pain).   rosuvastatin (CRESTOR) 10 MG tablet Take 1 tablet by mouth once daily.   [DISCONTINUED] [employer] MELATONIN 5 MG TABS as needed (sleep).   [DISCONTINUED] insulin degludec (TRESIBA) 100 UNIT/ML FlexTouch Pen Inject 10 Units into the skin every evening. (Patient taking differently: Inject 30 Units into the skin every evening.)   No facility-administered encounter medications on file as of 05/02/2023.    ALLERGIES: Allergies  Allergen Reactions   Atorvastatin Other (See Comments)    Other reaction(s): lightheaded    VACCINATION STATUS: Immunization History  Administered Date(s) Administered   COVID-19, mRNA, vaccine(Comirnaty)12 years and older 02/06/2023   Fluad Quad(high Dose 65+) 09/18/2021   Influenza Split 09/08/2012, 09/08/2013   Influenza, High Dose Seasonal PF 09/02/2019   Influenza-Unspecified 09/08/2012, 08/09/2022   PFIZER(Purple Top)SARS-COV-2 Vaccination 01/22/2020, 02/13/2020   Pfizer Covid-19 Vaccine Bivalent Booster 3yrs & up 10/26/2021    Diabetes He presents for his initial diabetic visit. He has type 2 diabetes mellitus. His disease course has been worsening. There are no hypoglycemic associated symptoms. Pertinent negatives for hypoglycemia include no confusion, headaches, pallor or seizures. Associated symptoms include polydipsia and polyuria. Pertinent negatives for diabetes include no chest pain, no fatigue, no polyphagia and no weakness. There are no hypoglycemic complications. 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 (Obesity, hyperlipidemia, hypertension, sedentary life) Risk factors for coronary artery disease include diabetes mellitus, dyslipidemia, family history,  hypertension, male sex, obesity and sedentary lifestyle. Current diabetic treatment includes insulin injections and oral agent (monotherapy) (He is currently on Tresiba 30 units nightly, Farxiga 10 mg p.o.). His weight is increasing steadily. He is following a generally unhealthy diet. When asked about meal planning, he reported none. He has not had a previous visit with a dietitian. He rarely participates in exercise. His home blood glucose trend is increasing steadily. His overall blood glucose range is >200 mg/dl. (He wears a CGM.  He has 40% time range, 60% hyperglycemia.  No  hypoglycemia.  His recent A1c was 10%.) An ACE inhibitor/angiotensin II receptor blocker is being taken.  Hyperlipidemia This is a chronic problem. The current episode started more than 1 year ago. The problem is uncontrolled. Exacerbating diseases include chronic renal disease. Pertinent negatives include no chest pain, myalgias or shortness of breath. Current antihyperlipidemic treatment includes statins. Risk factors for coronary artery disease include dyslipidemia, diabetes mellitus, hypertension, male sex, obesity and a sedentary lifestyle.  Hypertension This is a chronic problem. The current episode started more than 1 year ago. The problem is controlled. Pertinent negatives include no chest pain, headaches, neck pain, palpitations or shortness of breath. Risk factors for coronary artery disease include dyslipidemia, obesity, male gender, diabetes mellitus and sedentary lifestyle. Past treatments include angiotensin blockers. Hypertensive end-organ damage includes kidney disease and CVA. Identifiable causes of hypertension include chronic renal disease.     Review of Systems  Constitutional:  Negative for chills, fatigue, fever and unexpected weight change.  HENT:  Negative for dental problem, mouth sores and trouble swallowing.   Eyes:  Negative for visual disturbance.  Respiratory:  Negative for cough, choking, chest  tightness, shortness of breath and wheezing.   Cardiovascular:  Negative for chest pain, palpitations and leg swelling.  Gastrointestinal:  Negative for abdominal distention, abdominal pain, constipation, diarrhea, nausea and vomiting.  Endocrine: Positive for polydipsia and polyuria. Negative for polyphagia.  Genitourinary:  Negative for dysuria, flank pain, hematuria and urgency.  Musculoskeletal:  Negative for back pain, gait problem, myalgias and neck pain.  Skin:  Negative for pallor, rash and wound.  Neurological:  Negative for seizures, syncope, weakness, numbness and headaches.  Psychiatric/Behavioral:  Negative for confusion and dysphoric mood.     Objective:       05/02/2023    8:45 AM 03/18/2023    8:34 AM 12/16/2022    9:41 AM  Vitals with BMI  Height 5\' 4"  5\' 3"  5\' 4"   Weight 186 lbs 3 oz 183 lbs 183 lbs  BMI 31.95 32.43 31.4  Systolic 124 130 938  Diastolic 68 60 68  Pulse 76 80 79    BP 124/68   Pulse 76   Ht 5\' 4"  (1.626 m)   Wt 186 lb 3.2 oz (84.5 kg)   BMI 31.96 kg/m   Wt Readings from Last 3 Encounters:  05/02/23 186 lb 3.2 oz (84.5 kg)  03/18/23 183 lb (83 kg)  12/16/22 183 lb (83 kg)     Physical Exam Constitutional:      General: He is not in acute distress.    Appearance: He is well-developed.  HENT:     Head: Normocephalic and atraumatic.  Neck:     Thyroid: No thyromegaly.     Trachea: No tracheal deviation.  Cardiovascular:     Rate and Rhythm: Normal rate.     Pulses:          Dorsalis pedis pulses are 1+ on the right side and 1+ on the left side.       Posterior tibial pulses are 1+ on the right side and 1+ on the left side.     Heart sounds: Normal heart sounds, S1 normal and S2 normal. No murmur heard.    No gallop.  Pulmonary:     Effort: No respiratory distress.     Breath sounds: Normal breath sounds. No wheezing.  Abdominal:     General: Bowel sounds are normal. There is no distension.     Palpations: Abdomen is soft.  Tenderness: There is no abdominal tenderness. There is no guarding.  Musculoskeletal:     Right shoulder: No swelling or deformity.     Cervical back: Normal range of motion and neck supple.  Skin:    General: Skin is warm and dry.     Findings: No rash.     Nails: There is no clubbing.  Neurological:     Mental Status: He is alert and oriented to person, place, and time.     Cranial Nerves: No cranial nerve deficit.     Sensory: No sensory deficit.     Gait: Gait normal.     Deep Tendon Reflexes: Reflexes are normal and symmetric.  Psychiatric:        Speech: Speech normal.        Behavior: Behavior normal. Behavior is cooperative.        Thought Content: Thought content normal.        Judgment: Judgment normal.     CMP ( most recent) CMP     Component Value Date/Time   NA 138 03/06/2023 0802   K 4.8 03/06/2023 0802   CL 100 03/06/2023 0802   CO2 23 03/06/2023 0802   GLUCOSE 111 (H) 03/06/2023 0802   GLUCOSE 192 (H) 11/12/2022 1031   BUN 27 03/06/2023 0802   CREATININE 1.49 (H) 03/06/2023 0802   CREATININE 1.32 (H) 11/12/2022 1031   CALCIUM 8.8 03/06/2023 0802   PROT 6.7 03/06/2023 0802   ALBUMIN 4.1 03/06/2023 0802   AST 27 03/06/2023 0802   ALT 36 03/06/2023 0802   ALKPHOS 137 (H) 03/06/2023 0802   BILITOT 0.2 03/06/2023 0802   GFRNONAA 46 (L) 03/19/2021 0913   GFRAA 53 (L) 03/19/2021 0913     Lipid Panel ( most recent) Lipid Panel     Component Value Date/Time   CHOL 222 (H) 03/06/2023 0802   TRIG 92 03/06/2023 0802   HDL 64 03/06/2023 0802   CHOLHDL 3.2 03/19/2021 0913   LDLCALC 142 (H) 03/06/2023 0802   LDLCALC 83 03/19/2021 0913   LABVLDL 16 03/06/2023 0802      Lab Results  Component Value Date   TSH 2.060 03/06/2023   TSH 1.530 10/09/2021   TSH 1.96 03/19/2021           Assessment & Plan:   1. Type 2 diabetes mellitus with stage 3a chronic kidney disease, with long-term current use of insulin (HCC)   - Travarius Bacher has currently  uncontrolled symptomatic type 2 DM since  77 years of age,  with most recent A1c of 10 %. Recent labs reviewed. - I had a long discussion with him about the possible risk factors and  the pathology behind its diabetes and its complications. -his diabetes is complicated by CVA, CKD stage 3a, obesity/sedentary life, comorbid hypertension, hyperlipidemia, and he remains at exceedingly high risk for more acute and chronic complications which include CAD, CVA, CKD, retinopathy, and neuropathy. These are all discussed in detail with him.  - I discussed all available options of managing his diabetes including de-escalation of medications. I have counseled him on Food as Medicine by adopting a Whole Food , Plant Predominant  ( WFPP) nutrition as recommended by [employer] of Lifestyle Medicine. Patient is encouraged to switch to  unprocessed or minimally processed  complex starch, adequate protein intake (mainly plant source), minimal liquid fat, plenty of fruits, and vegetables. -  he is advised to stick to a routine mealtimes to eat 3 complete meals a  day and snack only when necessary ( to snack only to correct hypoglycemia BG <70 day time or <100 at night).   - he acknowledges that there is a room for improvement in his food and drink choices. - Further Specific Suggestion is made for him to avoid simple carbohydrates  from his diet including Cakes, Sweet Desserts, Ice Cream, Soda (diet and regular), Sweet Tea, Candies, Chips, Cookies, Store Bought Juices, Alcohol ,  Artificial Sweeteners,  Coffee Creamer, and "Sugar-free" Products. This will help patient to have more stable blood glucose profile and potentially avoid unintended weight gain.  The following Lifestyle Medicine recommendations according to American College of Lifestyle Medicine [facility]) were discussed and offered to patient and he agrees to start the journey:  A.  Whole Foods, Plant-based plate comprising of fruits and vegetables, plant-based  proteins, whole-grain carbohydrates was discussed in detail with the patient.   A list for source of those nutrients were also provided to the patient.  Patient will use only water or unsweetened tea for hydration. B.  The need to stay away from risky substances including alcohol, smoking; obtaining 7 to 9 hours of restorative sleep, at least 150 minutes of moderate intensity exercise weekly, the importance of healthy social connections,  and stress reduction techniques were discussed. C.  A full color page of  Calorie density of various food groups per pound showing examples of each food groups was provided to the patient.  - he will be scheduled with Norm Salt, RDN, CDE for individualized diabetes education.  - I have approached him with the following individualized plan to manage  his diabetes and patient agrees:  -In light of his current and prevailing hyperglycemic burden, he will continue to need insulin treatment in order for him to achieve control of diabetes to target.  Accordingly, he is advised to increase his [place] to 40 units nightly, associated with continuous monitoring of his blood glucose using his CGM device.    - he is warned not to take insulin without proper monitoring per orders. - Adjustment parameters are given to him for hypo and hyperglycemia in writing. - he is encouraged to call clinic for blood glucose levels less than 70 or above 200 mg /dl. - he is advised to continue Farxiga 10 mg p.o. daily at breakfast, therapeutically suitable for patient . -He may requalify for low-dose metformin on subsequent visits.   - he will be considered for GLP-1 receptor agonists as appropriate next visit.    - Specific targets for  A1c;  LDL, HDL,  and Triglycerides were discussed with the patient.  2) Blood Pressure /Hypertension:  his blood pressure is  controlled to target.   he is advised to continue his current medications including clonidine 0.1 mg p.o. daily, losartan 25 mg  p.o. daily with breakfast . 3) Lipids/Hyperlipidemia:   Review of his recent lipid panel showed uncontrolled  LDL at 145 .  he  is advised to continue    Crestor 10 mg daily at bedtime.  Side effects and precautions discussed with him.  4) obesity wit class 1-  Body mass index is 31.96 kg/m.  -   clearly complicating his diabetes care.   he is  a candidate for weight loss. I discussed with him the fact that loss of 5 - 10% of his  current body weight will have the most impact on his diabetes management.  The above detailed  ACLM recommendations for nutrition, exercise, sleep, social life, avoidance of risky  substances, the need for restorative sleep   information will also detailed on discharge instructions.  5) Chronic Care/Health Maintenance:  -he  is on ACEI/ARB and Statin medications and  is encouraged to initiate and continue to follow up with Ophthalmology, Dentist,  Podiatrist at least yearly or according to recommendations, and advised to   stay away from smoking. I have recommended yearly flu vaccine and pneumonia vaccine at least every 5 years; moderate intensity exercise for up to 150 minutes weekly; and  sleep for 7- 9 hours a day.  - he is  advised to maintain close follow up with Fleet Contras, MD for primary care needs, as well as his other providers for optimal and coordinated care.   I spent 62 minutes in the care of the patient today including review of labs from CMP, Lipids, Thyroid Function, Hematology (current and previous including abstractions from other facilities); face-to-face time discussing  his blood glucose readings/logs, discussing hypoglycemia and hyperglycemia episodes and symptoms, medications doses, his options of short and long term treatment based on the latest standards of care / guidelines;  discussion about incorporating lifestyle medicine;  and documenting the encounter. Risk reduction counseling performed per USPSTF guidelines to reduce  obesity and  cardiovascular risk factors.      Please refer to Patient Instructions for Blood Glucose Monitoring and Insulin/Medications Dosing Guide"  in media tab for additional information. Please  also refer to " Patient Self Inventory" in the Media  tab for reviewed elements of pertinent patient history.  [name] participated in the discussions, expressed understanding, and voiced agreement with the above plans.  All questions were answered to his satisfaction. he is encouraged to contact clinic should he have any questions or concerns prior to his return visit.   Follow up plan: - Return in about 2 weeks (around 05/16/2023) for F/U with Meter/CGM Megan Salon Only - no Labs.  Marquis Lunch, MD [facility] [address] [place], [place] [address] Phone: [phone]  Fax: [phone]    05/02/2023, 9:36 AM  This note was partially dictated with voice recognition software. Similar sounding words can be transcribed inadequately or may not  be corrected upon review.

## 2023-05-12 ENCOUNTER — Ambulatory Visit: Payer: Medicare Other | Admitting: Diagnostic Neuroimaging

## 2023-05-12 ENCOUNTER — Telehealth: Payer: Self-pay | Admitting: Internal Medicine

## 2023-05-12 ENCOUNTER — Ambulatory Visit (INDEPENDENT_AMBULATORY_CARE_PROVIDER_SITE_OTHER): Payer: Medicare Other

## 2023-05-12 ENCOUNTER — Encounter: Payer: Self-pay | Admitting: Diagnostic Neuroimaging

## 2023-05-12 VITALS — BP 137/73 | HR 81 | Ht 64.0 in | Wt 187.6 lb

## 2023-05-12 DIAGNOSIS — R55 Syncope and collapse: Secondary | ICD-10-CM

## 2023-05-12 DIAGNOSIS — G40309 Generalized idiopathic epilepsy and epileptic syndromes, not intractable, without status epilepticus: Secondary | ICD-10-CM | POA: Diagnosis not present

## 2023-05-12 DIAGNOSIS — R4020 Unspecified coma: Secondary | ICD-10-CM

## 2023-05-12 MED ORDER — DILANTIN 100 MG PO CAPS: 400.0000 mg | ORAL_CAPSULE | Freq: Every day | ORAL | 4 refills | Status: DC

## 2023-05-12 NOTE — Telephone Encounter (Signed)
Pt returning call

## 2023-05-12 NOTE — Telephone Encounter (Signed)
LMOVM for patient to call the device clinic. I left the device clinic number for the pt to call back.
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
## 2023-05-12 NOTE — Telephone Encounter (Signed)
I spoke with the patient and let him know that his monitor works automatically.
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
## 2023-05-12 NOTE — Telephone Encounter (Signed)
Patient is requesting a call back to get further instructions and information for his device. Please advise.

## 2023-05-13 LAB — CUP PACEART REMOTE DEVICE CHECK: Date Time Interrogation Session: 20240602230235

## 2023-05-19 ENCOUNTER — Ambulatory Visit: Payer: Medicare Other

## 2023-05-21 ENCOUNTER — Other Ambulatory Visit (HOSPITAL_BASED_OUTPATIENT_CLINIC_OR_DEPARTMENT_OTHER): Payer: Self-pay

## 2023-05-30 ENCOUNTER — Ambulatory Visit: Payer: Medicare Other | Admitting: "Endocrinology

## 2023-05-30 ENCOUNTER — Encounter: Payer: Self-pay | Admitting: "Endocrinology

## 2023-05-30 VITALS — BP 124/72 | HR 72 | Ht 64.0 in | Wt 191.8 lb

## 2023-05-30 DIAGNOSIS — Z794 Long term (current) use of insulin: Secondary | ICD-10-CM | POA: Diagnosis not present

## 2023-05-30 DIAGNOSIS — E1122 Type 2 diabetes mellitus with diabetic chronic kidney disease: Secondary | ICD-10-CM

## 2023-05-30 DIAGNOSIS — N1831 Chronic kidney disease, stage 3a: Secondary | ICD-10-CM

## 2023-05-30 DIAGNOSIS — Z7985 Long-term (current) use of injectable non-insulin antidiabetic drugs: Secondary | ICD-10-CM | POA: Insufficient documentation

## 2023-05-30 DIAGNOSIS — E782 Mixed hyperlipidemia: Secondary | ICD-10-CM

## 2023-05-30 DIAGNOSIS — E6609 Other obesity due to excess calories: Secondary | ICD-10-CM | POA: Diagnosis not present

## 2023-05-30 DIAGNOSIS — I10 Essential (primary) hypertension: Secondary | ICD-10-CM

## 2023-05-30 DIAGNOSIS — Z6832 Body mass index (BMI) 32.0-32.9, adult: Secondary | ICD-10-CM

## 2023-05-30 LAB — POCT GLYCOSYLATED HEMOGLOBIN (HGB A1C): HbA1c, POC (controlled diabetic range): 8.7 % — AB (ref 0.0–7.0)

## 2023-05-30 MED ORDER — OZEMPIC (0.25 OR 0.5 MG/DOSE) 2 MG/3ML ~~LOC~~ SOPN: 0.2500 mg | PEN_INJECTOR | SUBCUTANEOUS | 0 refills | Status: DC

## 2023-05-30 NOTE — Progress Notes (Signed)
Endocrinology Consult Note       05/30/2023, 10:51 AM   Subjective:    Patient ID: Jason Salazar, male    DOB: 25-Mar-1946.  Jason Salazar is being seen in consultation for management of currently uncontrolled symptomatic diabetes requested by  Fleet Contras, MD.   Past Medical History:  Diagnosis Date   Allergic rhinitis    Balantidiasis    Cancer [facility])    prostate cancer treated 2007 with radiation seed placement   Diabetes (HCC)    H/O prostate cancer    Radioactive seeds   Hypercholesteremia    Hyperlipidemia    Hyperlipoproteinemia    Hypertension    Sleep apnea     Past Surgical History:  Procedure Laterality Date   cyst removed from right kidney      Social History   Socioeconomic History   Marital status: Married    Spouse name: Jason Salazar   Number of children: 1   Years of education: HS   Highest education level: Not on file  Occupational History    Employer: [employer]  Tobacco Use   Smoking status: Never   Smokeless tobacco: Never  Vaping Use   Vaping Use: Never used  Substance and Sexual Activity   Alcohol use: No   Drug use: No   Sexual activity: Not on file  Other Topics Concern   Not on file  Social History Narrative   Patient is married to [place] and has one son.   Caffeine Use: none; quit a few months ago             Social Determinants of Corporate investment banker Strain: Not on file  Food Insecurity: Not on file  Transportation Needs: Not on file  Physical Activity: Not on file  Stress: Not on file  Social Connections: Not on file    Family History  Problem Relation Age of Onset   CVA Mother    Sleep apnea Brother    Thyroid cancer Father    Prostate cancer Father     Outpatient Encounter Medications as of 05/30/2023  Medication Sig   Semaglutide,0.25 or 0.5MG /DOS, (OZEMPIC, 0.25 OR 0.5 MG/DOSE,) 2 MG/3ML SOPN Inject 0.25 mg  into the skin once a week.   allopurinol (ZYLOPRIM) 100 MG tablet Take 100 mg by mouth 2 (two) times daily.   allopurinol (ZYLOPRIM) 100 MG tablet Take 2 tablets (200 mg total) by mouth daily.   aspirin EC 81 MG tablet Take 81 mg by mouth daily.   b complex vitamins tablet Take 1 tablet by mouth daily.   bimatoprost (LUMIGAN) 0.01 % SOLN Instill 1 drop into both eyes at bedtime   Cholecalciferol (VITAMIN D3) 1000 units CAPS Take 1,000 Units by mouth 1 day or 1 dose.   Colchicine 0.6 MG CAPS Take 1 capsule by mouth 2 (two) times daily as needed for gout flare up   dapagliflozin propanediol (FARXIGA) 10 MG TABS tablet Take 1 tablet (10 mg total) by mouth daily.   DILANTIN 100 MG ER capsule Take 4 capsules (400 mg total) by mouth at bedtime.   furosemide (LASIX) 20 MG tablet  Take 1 tablet (20 mg total) by mouth daily.   gabapentin (NEURONTIN) 300 MG capsule Take 1 capsule (300 mg total) by mouth at bedtime. (Patient taking differently: Take 300 mg by mouth as needed.)   insulin degludec (TRESIBA) 100 UNIT/ML FlexTouch Pen Inject 40 Units into the skin at bedtime.   Insulin Pen Needle 31G X 5 MM MISC Use for insulin injection 3 times a day   losartan (COZAAR) 25 MG tablet Take 25 mg by mouth daily.   mometasone (NASONEX) 50 MCG/ACT nasal spray 2 sprays as needed.   Multiple Vitamins-Minerals (ONE-A-DAY MENS 50+ ADVANTAGE PO) Take 1 tablet by mouth daily.   predniSONE (DELTASONE) 20 MG tablet Take by mouth as needed (pain).   rosuvastatin (CRESTOR) 10 MG tablet Take 1 tablet by mouth once daily.   tiZANidine (ZANAFLEX) 4 MG tablet Take 4 mg by mouth 2 (two) times daily as needed.   Vitamin C 250 MG TABS Take 250 mg by mouth 1 day or 1 dose.   Zinc 22.5 MG TABS Take 22.5 mg by mouth 1 day or 1 dose.   No facility-administered encounter medications on file as of 05/30/2023.    ALLERGIES: Allergies  Allergen Reactions   Atorvastatin Other (See Comments)    Other reaction(s): lightheaded     VACCINATION STATUS: Immunization History  Administered Date(s) Administered   COVID-19, mRNA, vaccine(Comirnaty)12 years and older 02/06/2023   Fluad Quad(high Dose 65+) 09/18/2021   Influenza Split 09/08/2012, 09/08/2013   Influenza, High Dose Seasonal PF 09/02/2019   Influenza-Unspecified 09/08/2012, 08/09/2022   PFIZER(Purple Top)SARS-COV-2 Vaccination 01/22/2020, 02/13/2020   Pfizer Covid-19 Vaccine Bivalent Booster 53yrs & up 10/26/2021    Diabetes He presents for his follow-up diabetic visit. He has type 2 diabetes mellitus. His disease course has been improving. There are no hypoglycemic associated symptoms. Pertinent negatives for hypoglycemia include no confusion, headaches, pallor or seizures. Associated symptoms include polydipsia and polyuria. Pertinent negatives for diabetes include no chest pain, no fatigue, no polyphagia and no weakness. There are no hypoglycemic complications. Symptoms are improving. 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 (Obesity, hyperlipidemia, hypertension, sedentary life) Risk factors for coronary artery disease include diabetes mellitus, dyslipidemia, family history, hypertension, male sex, obesity and sedentary lifestyle. Current diabetic treatment includes insulin injections and oral agent (monotherapy). His weight is increasing steadily. He is following a generally unhealthy diet. When asked about meal planning, he reported none. He has not had a previous visit with a dietitian. He rarely participates in exercise. His home blood glucose trend is decreasing steadily. His breakfast blood glucose range is generally >200 mg/dl. His lunch blood glucose range is generally >200 mg/dl. His dinner blood glucose range is generally >200 mg/dl. His bedtime blood glucose range is generally >200 mg/dl. His overall blood glucose range is >200 mg/dl. (He wears a CGM.  He has 38% time range, 41% hyperglycemia, 21% Libre to hyperglycemia.  No hypoglycemia.   His point-of-care A1c is 8.7% improving from 10%. ) An ACE inhibitor/angiotensin II receptor blocker is being taken.  Hyperlipidemia This is a chronic problem. The current episode started more than 1 year ago. The problem is uncontrolled. Exacerbating diseases include chronic renal disease and obesity. Pertinent negatives include no chest pain, myalgias or shortness of breath. Current antihyperlipidemic treatment includes statins. Risk factors for coronary artery disease include dyslipidemia, diabetes mellitus, hypertension, male sex, obesity and a sedentary lifestyle.  Hypertension This is a chronic problem. The current episode started more than 1 year ago. The  problem is controlled. Pertinent negatives include no chest pain, headaches, neck pain, palpitations or shortness of breath. Risk factors for coronary artery disease include dyslipidemia, obesity, male gender, diabetes mellitus and sedentary lifestyle. Past treatments include angiotensin blockers. Hypertensive end-organ damage includes kidney disease and CVA. Identifiable causes of hypertension include chronic renal disease.     Review of Systems  Constitutional:  Negative for chills, fatigue, fever and unexpected weight change.  HENT:  Negative for dental problem, mouth sores and trouble swallowing.   Eyes:  Negative for visual disturbance.  Respiratory:  Negative for cough, choking, chest tightness, shortness of breath and wheezing.   Cardiovascular:  Negative for chest pain, palpitations and leg swelling.  Gastrointestinal:  Negative for abdominal distention, abdominal pain, constipation, diarrhea, nausea and vomiting.  Endocrine: Positive for polydipsia and polyuria. Negative for polyphagia.  Genitourinary:  Negative for dysuria, flank pain, hematuria and urgency.  Musculoskeletal:  Negative for back pain, gait problem, myalgias and neck pain.  Skin:  Negative for pallor, rash and wound.  Neurological:  Negative for seizures, syncope,  weakness, numbness and headaches.  Psychiatric/Behavioral:  Negative for confusion and dysphoric mood.     Objective:       05/30/2023    8:18 AM 05/12/2023    9:31 AM 05/02/2023    8:45 AM  Vitals with BMI  Height 5\' 4"  5\' 4"  5\' 4"   Weight 191 lbs 13 oz 187 lbs 10 oz 186 lbs 3 oz  BMI 32.91 32.19 31.95  Systolic 124 137 093  Diastolic 72 73 68  Pulse 72 81 76    BP 124/72   Pulse 72   Ht 5\' 4"  (1.626 m)   Wt 191 lb 12.8 oz (87 kg)   BMI 32.92 kg/m   Wt Readings from Last 3 Encounters:  05/30/23 191 lb 12.8 oz (87 kg)  05/12/23 187 lb 9.6 oz (85.1 kg)  05/02/23 186 lb 3.2 oz (84.5 kg)       CMP ( most recent) CMP     Component Value Date/Time   NA 138 03/06/2023 0802   K 4.8 03/06/2023 0802   CL 100 03/06/2023 0802   CO2 23 03/06/2023 0802   GLUCOSE 111 (H) 03/06/2023 0802   GLUCOSE 192 (H) 11/12/2022 1031   BUN 27 03/06/2023 0802   CREATININE 1.49 (H) 03/06/2023 0802   CREATININE 1.32 (H) 11/12/2022 1031   CALCIUM 8.8 03/06/2023 0802   PROT 6.7 03/06/2023 0802   ALBUMIN 4.1 03/06/2023 0802   AST 27 03/06/2023 0802   ALT 36 03/06/2023 0802   ALKPHOS 137 (H) 03/06/2023 0802   BILITOT 0.2 03/06/2023 0802   GFRNONAA 46 (L) 03/19/2021 0913   GFRAA 53 (L) 03/19/2021 0913     Lipid Panel ( most recent) Lipid Panel     Component Value Date/Time   CHOL 222 (H) 03/06/2023 0802   TRIG 92 03/06/2023 0802   HDL 64 03/06/2023 0802   CHOLHDL 3.2 03/19/2021 0913   LDLCALC 142 (H) 03/06/2023 0802   LDLCALC 83 03/19/2021 0913   LABVLDL 16 03/06/2023 0802      Lab Results  Component Value Date   TSH 2.060 03/06/2023   TSH 1.530 10/09/2021   TSH 1.96 03/19/2021      Assessment & Plan:   1. Type 2 diabetes mellitus with stage 3a chronic kidney disease, with long-term current use of insulin (HCC)   - Jason Salazar has currently uncontrolled symptomatic type 2 DM since  77 years of  age.  He wears a CGM.  He has 38% time range, 41% hyperglycemia, 21%  Libre to hyperglycemia.  No hypoglycemia.  His point-of-care A1c is 8.7% improving from 10%.  Recent labs reviewed. - I had a long discussion with him about the possible risk factors and  the pathology behind its diabetes and its complications. -his diabetes is complicated by CVA, CKD stage 3a, obesity/sedentary life, comorbid hypertension, hyperlipidemia, and he remains at exceedingly high risk for more acute and chronic complications which include CAD, CVA, CKD, retinopathy, and neuropathy. These are all discussed in detail with him.  - I discussed all available options of managing his diabetes including de-escalation of medications. I have counseled him on Food as Medicine by adopting a Whole Food , Plant Predominant  ( WFPP) nutrition as recommended by [employer] of Lifestyle Medicine. Patient is encouraged to switch to  unprocessed or minimally processed  complex starch, adequate protein intake (mainly plant source), minimal liquid fat, plenty of fruits, and vegetables. -  he is advised to stick to a routine mealtimes to eat 3 complete meals a day and snack only when necessary ( to snack only to correct hypoglycemia BG <70 day time or <100 at night).  - he acknowledges that there is a room for improvement in his food and drink choices. - Suggestion is made for him to avoid simple carbohydrates  from his diet including Cakes, Sweet Desserts, Ice Cream, Soda (diet and regular), Sweet Tea, Candies, Chips, Cookies, Store Bought Juices, Alcohol , Artificial Sweeteners,  Coffee Creamer, and "Sugar-free" Products, Lemonade. This will help patient to have more stable blood glucose profile and potentially avoid unintended weight gain.  The following Lifestyle Medicine recommendations according to American College of Lifestyle Medicine  [facility]) were discussed and and offered to patient and he  agrees to start the journey:  A. Whole Foods, Plant-Based Nutrition comprising of fruits and vegetables,  plant-based proteins, whole-grain carbohydrates was discussed in detail with the patient.   A list for source of those nutrients were also provided to the patient.  Patient will use only water or unsweetened tea for hydration. B.  The need to stay away from risky substances including alcohol, smoking; obtaining 7 to 9 hours of restorative sleep, at least 150 minutes of moderate intensity exercise weekly, the importance of healthy social connections,  and stress management techniques were discussed. C.  A full color page of  Calorie density of various food groups per pound showing examples of each food groups was provided to the patient.    - he will be scheduled with Jason Salazar, RDN, CDE for individualized diabetes education.  - I have approached him with the following individualized plan to manage  his diabetes and patient agrees:  -In light of his current and prevailing hyperglycemic burden, he will continue to need insulin treatment in order for him to achieve control of diabetes to target.  Accordingly, I discussed and increase his Jason Salazar to 50 units nightly,  associated with continuous monitoring of his blood glucose using his CGM device.    - he is warned not to take insulin without proper monitoring per orders. - Adjustment parameters are given to him for hypo and hyperglycemia in writing. - he is encouraged to call clinic for blood glucose levels less than 70 or above 200 mg /dl. - he is advised to continue Farxiga 10 mg p.o. daily at breakfast, therapeutically suitable for patient . -He will benefit from GLP-1 receptor agonist.  I discussed and prescribed Ozempic 0.25 mg subcutaneous weekly.  This medication will be advanced as he tolerates. -Targets for  A1c;  LDL, HDL,  and Triglycerides were discussed with the patient.  2) Blood Pressure /Hypertension:    His blood pressure is controlled to target.  he is advised to continue his current medications including clonidine 0.1 mg p.o.  daily, losartan 25 mg p.o. daily with breakfast . 3) Lipids/Hyperlipidemia:   Review of his recent lipid panel showed uncontrolled  LDL at 145 .  he  is advised to continue Crestor 10 mg p.o. daily at bedtime.  Side effects and precautions discussed with him.  4) obesity wit class 1-  Body mass index is 32.92 kg/m.  -   clearly complicating his diabetes care.   he is  a candidate for weight loss. I discussed with him the fact that loss of 5 - 10% of his  current body weight will have the most impact on his diabetes management.  The above detailed  ACLM recommendations for nutrition, exercise, sleep, social life, avoidance of risky substances, the need for restorative sleep   information will also detailed on discharge instructions.  5) Chronic Care/Health Maintenance:  -he  is on ACEI/ARB and Statin medications and  is encouraged to initiate and continue to follow up with Ophthalmology, Dentist,  Podiatrist at least yearly or according to recommendations, and advised to   stay away from smoking. I have recommended yearly flu vaccine and pneumonia vaccine at least every 5 years; moderate intensity exercise for up to 150 minutes weekly; and  sleep for 7- 9 hours a day.  - he is  advised to maintain close follow up with Fleet Contras, MD for primary care needs, as well as his other providers for optimal and coordinated care.  I spent  26  minutes in the care of the patient today including review of labs from CMP, Lipids, Thyroid Function, Hematology (current and previous including abstractions from other facilities); face-to-face time discussing  his blood glucose readings/logs, discussing hypoglycemia and hyperglycemia episodes and symptoms, medications doses, his options of short and long term treatment based on the latest standards of care / guidelines;  discussion about incorporating lifestyle medicine;  and documenting the encounter. Risk reduction counseling performed per USPSTF guidelines to reduce   obesity and cardiovascular risk factors.     Please refer to Patient Instructions for Blood Glucose Monitoring and Insulin/Medications Dosing Guide"  in media tab for additional information. Please  also refer to " Patient Self Inventory" in the Media  tab for reviewed elements of pertinent patient history.  Jason Salazar participated in the discussions, expressed understanding, and voiced agreement with the above plans.  All questions were answered to his satisfaction. he is encouraged to contact clinic should he have any questions or concerns prior to his return visit.   Follow up plan: - Return in about 3 months (around 08/30/2023) for F/U with Pre-visit Labs, Meter/CGM/Logs, A1c here.  Marquis Lunch, MD [facility] [address] [place], [place] [address] Phone: [phone]  Fax: [phone]    05/30/2023, 10:51 AM  This note was partially dictated with voice recognition software. Similar sounding words can be transcribed inadequately or may not  be corrected upon review.

## 2023-05-30 NOTE — Patient Instructions (Signed)
                                     Advice for Weight Management  -For most of us the best way to lose weight is by diet management. Generally speaking, diet management means consuming less calories intentionally which over time brings about progressive weight loss.  This can be achieved more effectively by avoiding ultra processed carbohydrates, processed meats, unhealthy fats.    It is critically important to know your numbers: how much calorie you are consuming and how much calorie you need. More importantly, our carbohydrates sources should be unprocessed naturally occurring  complex starch food items.  It is always important to balance nutrition also by  appropriate intake of proteins (mainly plant-based), healthy fats/oils, plenty of fruits and vegetables.   -The American College of Lifestyle Medicine (ACL M) recommends nutrition derived mostly from Whole Food, Plant Predominant Sources example an apple instead of applesauce or apple pie. Eat Plenty of vegetables, Mushrooms, fruits, Legumes, Whole Grains, Nuts, seeds in lieu of processed meats, processed snacks/pastries red meat, poultry, eggs.  Use only water or unsweetened tea for hydration.  The College also recommends the need to stay away from risky substances including alcohol, smoking; obtaining 7-9 hours of restorative sleep, at least 150 minutes of moderate intensity exercise weekly, importance of healthy social connections, and being mindful of stress and seek help when it is overwhelming.    -Sticking to a routine mealtime to eat 3 meals a day and avoiding unnecessary snacks is shown to have a big role in weight control. Under normal circumstances, the only time we burn stored energy is when we are hungry, so allow  some hunger to take place- hunger means no food between appropriate meal times, only water.  It is not advisable to starve.   -It is better to avoid simple carbohydrates including:  Cakes, Sweet Desserts, Ice Cream, Soda (diet and regular), Sweet Tea, Candies, Chips, Cookies, Store Bought Juices, Alcohol in Excess of  1-2 drinks a day, Lemonade,  Artificial Sweeteners, Doughnuts, Coffee Creamers, "Sugar-free" Products, etc, etc.  This is not a complete list.....    -Consulting with certified diabetes educators is proven to provide you with the most accurate and current information on diet.  Also, you may be  interested in discussing diet options/exchanges , we can schedule a visit with Jason Salazar, RDN, CDE for individualized nutrition education.  -Exercise: If you are able: 30 -60 minutes a day ,4 days a week, or 150 minutes of moderate intensity exercise weekly.    The longer the better if tolerated.  Combine stretch, strength, and aerobic activities.  If you were told in the past that you have high risk for cardiovascular diseases, or if you are currently symptomatic, you may seek evaluation by your heart doctor prior to initiating moderate to intense exercise programs.                                  Additional Care Considerations for Diabetes/Prediabetes   -Diabetes  is a chronic disease.  The most important care consideration is regular follow-up with your diabetes care provider with the goal being avoiding or delaying its complications and to take advantage of advances in medications and technology.  If appropriate actions are taken early enough, type 2 diabetes can even be   reversed.  Seek information from the right source.  - Whole Food, Plant Predominant Nutrition is highly recommended: Eat Plenty of vegetables, Mushrooms, fruits, Legumes, Whole Grains, Nuts, seeds in lieu of processed meats, processed snacks/pastries red meat, poultry, eggs as recommended by American College of  Lifestyle Medicine (ACLM).  -Type 2 diabetes is known to coexist with other important comorbidities such as high blood pressure and high cholesterol.  It is critical to control not only the  diabetes but also the high blood pressure and high cholesterol to minimize and delay the risk of complications including coronary artery disease, stroke, amputations, blindness, etc.  The good news is that this diet recommendation for type 2 diabetes is also very helpful for managing high cholesterol and high blood blood pressure.  - Studies showed that people with diabetes will benefit from a class of medications known as ACE inhibitors and statins.  Unless there are specific reasons not to be on these medications, the standard of care is to consider getting one from these groups of medications at an optimal doses.  These medications are generally considered safe and proven to help protect the heart and the kidneys.    - People with diabetes are encouraged to initiate and maintain regular follow-up with eye doctors, foot doctors, dentists , and if necessary heart and kidney doctors.     - It is highly recommended that people with diabetes quit smoking or stay away from smoking, and get yearly  flu vaccine and pneumonia vaccine at least every 5 years.  See above for additional recommendations on exercise, sleep, stress management , and healthy social connections.      

## 2023-05-31 ENCOUNTER — Other Ambulatory Visit (HOSPITAL_BASED_OUTPATIENT_CLINIC_OR_DEPARTMENT_OTHER): Payer: Self-pay

## 2023-06-02 ENCOUNTER — Encounter: Payer: Self-pay | Admitting: "Endocrinology

## 2023-06-03 ENCOUNTER — Other Ambulatory Visit: Payer: Self-pay

## 2023-06-03 ENCOUNTER — Other Ambulatory Visit: Payer: Self-pay | Admitting: "Endocrinology

## 2023-06-03 ENCOUNTER — Telehealth: Payer: Self-pay

## 2023-06-03 DIAGNOSIS — Z794 Long term (current) use of insulin: Secondary | ICD-10-CM

## 2023-06-03 MED ORDER — TRESIBA FLEXTOUCH 200 UNIT/ML ~~LOC~~ SOPN: 50.0000 [IU] | PEN_INJECTOR | Freq: Every evening | SUBCUTANEOUS | 3 refills | Status: DC

## 2023-06-03 MED ORDER — TIRZEPATIDE 2.5 MG/0.5ML ~~LOC~~ SOAJ: 2.5000 mg | SUBCUTANEOUS | 2 refills | Status: DC

## 2023-06-03 MED ORDER — INSULIN DEGLUDEC 100 UNIT/ML ~~LOC~~ SOPN
50.0000 [IU] | PEN_INJECTOR | Freq: Every day | SUBCUTANEOUS | 0 refills | Status: DC
Start: 2023-06-03 — End: 2023-06-03

## 2023-06-03 NOTE — Telephone Encounter (Signed)
Left a message requesting pt to return call to the office 

## 2023-06-04 NOTE — Progress Notes (Signed)
Carelink Summary Report / Loop Recorder 

## 2023-06-06 ENCOUNTER — Telehealth (HOSPITAL_COMMUNITY): Payer: Self-pay | Admitting: Emergency Medicine

## 2023-06-06 ENCOUNTER — Telehealth (HOSPITAL_COMMUNITY): Payer: Self-pay | Admitting: *Deleted

## 2023-06-06 NOTE — Telephone Encounter (Signed)
Reaching out to patient to offer assistance regarding upcoming cardiac imaging study; pt verbalizes understanding of appt date/time, parking situation and where to check in, pre-test NPO status and medications ordered, and verified current allergies; name and call back number provided for further questions should they arise Josh Nicolosi RN Navigator Cardiac Imaging [facility] [phone] office [phone] cell 

## 2023-06-06 NOTE — Telephone Encounter (Signed)
Reaching out to patient to offer assistance regarding upcoming cardiac imaging study; pt verbalizes understanding of appt date/time, parking situation and where to check in, and verified current allergies; name and call back number provided for further questions should they arise  Donterrius Santucci RN Navigator Cardiac Imaging [facility] [phone] office [phone] cell  Patient has had an MRI in the past without incident. 

## 2023-06-07 ENCOUNTER — Other Ambulatory Visit: Payer: Self-pay | Admitting: "Endocrinology

## 2023-06-07 ENCOUNTER — Other Ambulatory Visit (HOSPITAL_BASED_OUTPATIENT_CLINIC_OR_DEPARTMENT_OTHER): Payer: Self-pay

## 2023-06-09 ENCOUNTER — Other Ambulatory Visit: Payer: Self-pay | Admitting: Internal Medicine

## 2023-06-09 ENCOUNTER — Ambulatory Visit (HOSPITAL_COMMUNITY)
Admission: RE | Admit: 2023-06-09 | Discharge: 2023-06-09 | Disposition: A | Discharge status: Home / Self Care | Payer: Medicare Other | Source: Ambulatory Visit | Attending: Internal Medicine | Admitting: Internal Medicine

## 2023-06-09 DIAGNOSIS — I119 Hypertensive heart disease without heart failure: Secondary | ICD-10-CM

## 2023-06-09 DIAGNOSIS — G4733 Obstructive sleep apnea (adult) (pediatric): Secondary | ICD-10-CM | POA: Insufficient documentation

## 2023-06-09 DIAGNOSIS — E7800 Pure hypercholesterolemia, unspecified: Secondary | ICD-10-CM

## 2023-06-09 DIAGNOSIS — I361 Nonrheumatic tricuspid (valve) insufficiency: Secondary | ICD-10-CM

## 2023-06-09 DIAGNOSIS — I441 Atrioventricular block, second degree: Secondary | ICD-10-CM

## 2023-06-09 MED ORDER — GADOBUTROL 1 MMOL/ML IV SOLN
10.0000 mL | Freq: Once | INTRAVENOUS | Status: AC | PRN
  Administered 2023-06-09: 10 mL via INTRAVENOUS

## 2023-06-16 ENCOUNTER — Ambulatory Visit (INDEPENDENT_AMBULATORY_CARE_PROVIDER_SITE_OTHER): Payer: Medicare Other

## 2023-06-16 DIAGNOSIS — R55 Syncope and collapse: Secondary | ICD-10-CM | POA: Diagnosis not present

## 2023-06-16 LAB — CUP PACEART REMOTE DEVICE CHECK: Date Time Interrogation Session: 20240705230143

## 2023-06-23 ENCOUNTER — Ambulatory Visit: Payer: Medicare Other

## 2023-07-02 ENCOUNTER — Ambulatory Visit: Payer: Medicare Other | Admitting: Nutrition

## 2023-07-03 NOTE — Progress Notes (Signed)
Carelink Summary Report / Loop Recorder 

## 2023-07-04 ENCOUNTER — Other Ambulatory Visit (HOSPITAL_BASED_OUTPATIENT_CLINIC_OR_DEPARTMENT_OTHER): Payer: Self-pay

## 2023-07-05 ENCOUNTER — Other Ambulatory Visit (HOSPITAL_BASED_OUTPATIENT_CLINIC_OR_DEPARTMENT_OTHER): Payer: Self-pay

## 2023-07-21 ENCOUNTER — Ambulatory Visit: Payer: Medicare Other

## 2023-07-21 DIAGNOSIS — R55 Syncope and collapse: Secondary | ICD-10-CM | POA: Diagnosis not present

## 2023-07-28 ENCOUNTER — Other Ambulatory Visit (HOSPITAL_BASED_OUTPATIENT_CLINIC_OR_DEPARTMENT_OTHER): Payer: Self-pay

## 2023-07-28 ENCOUNTER — Ambulatory Visit: Payer: Medicare Other

## 2023-07-31 ENCOUNTER — Telehealth: Payer: Self-pay

## 2023-07-31 NOTE — Telephone Encounter (Signed)
Attempted to call pt. Lft VM for pt to call us back.

## 2023-07-31 NOTE — Telephone Encounter (Signed)
Tried to return a call to pt but did not receive an answer and was unable to leave a message.

## 2023-07-31 NOTE — Telephone Encounter (Signed)
Called pt back several more times as he has left several msgs. Pt did not answer.

## 2023-08-04 ENCOUNTER — Other Ambulatory Visit: Payer: Self-pay | Admitting: "Endocrinology

## 2023-08-05 NOTE — Progress Notes (Signed)
Carelink Summary Report / Loop Recorder 

## 2023-08-08 ENCOUNTER — Telehealth: Payer: Self-pay | Admitting: Internal Medicine

## 2023-08-08 DIAGNOSIS — G4733 Obstructive sleep apnea (adult) (pediatric): Secondary | ICD-10-CM

## 2023-08-08 DIAGNOSIS — Z72821 Inadequate sleep hygiene: Secondary | ICD-10-CM

## 2023-08-08 NOTE — Telephone Encounter (Addendum)
[facility] is no longer covering Pt CPAP due to insurance. [facility] is picking up his machine on Sunday. Pt wife states that [facility] is now taking over for his CPAP they are requesting for last OV notes and Prescription as well as his sleep study results   Fax : [phone]

## 2023-08-14 NOTE — Telephone Encounter (Signed)
Updated DME order placed

## 2023-08-25 ENCOUNTER — Ambulatory Visit: Payer: Medicare Other

## 2023-08-25 ENCOUNTER — Telehealth: Payer: Self-pay | Admitting: Internal Medicine

## 2023-08-25 ENCOUNTER — Other Ambulatory Visit: Payer: Self-pay | Admitting: "Endocrinology

## 2023-08-25 DIAGNOSIS — R55 Syncope and collapse: Secondary | ICD-10-CM

## 2023-08-25 DIAGNOSIS — G4733 Obstructive sleep apnea (adult) (pediatric): Secondary | ICD-10-CM

## 2023-08-25 NOTE — Telephone Encounter (Signed)
Patient needs last office notes and Last cpap readings sent to [facility]. [facility] needs a prescription for a Cpap His insurance no longer covers [facility]

## 2023-08-26 ENCOUNTER — Other Ambulatory Visit: Payer: Self-pay | Admitting: "Endocrinology

## 2023-08-26 DIAGNOSIS — E1122 Type 2 diabetes mellitus with diabetic chronic kidney disease: Secondary | ICD-10-CM

## 2023-08-26 LAB — CUP PACEART REMOTE DEVICE CHECK
Date Time Interrogation Session: 20240913230034
Implantable Pulse Generator Implant Date: 20231128

## 2023-08-29 ENCOUNTER — Encounter: Payer: Self-pay | Admitting: "Endocrinology

## 2023-08-30 NOTE — Telephone Encounter (Signed)
Called and spoke with patient. He is requesting to have an order sent to [facility] for his cpap machine. He is currently established with [facility] but [facility] is now out of network with his insurance.   Per his chart, on 08/14/23, an order was placed for [facility]. He was not made aware of this as no one had called him. He is not familiar with [facility] and does not want business with them. I advised him I would place the correct order today. He verbalized understanding.  Carollee Herter, can you call [facility] next week to void out the incorrect order you placed on 08/14/23?   New order for Jason Salazar has been placed.

## 2023-09-01 ENCOUNTER — Ambulatory Visit: Payer: Medicare Other

## 2023-09-01 NOTE — Telephone Encounter (Signed)
Message sent to [facility] to cancel order.

## 2023-09-04 ENCOUNTER — Encounter: Payer: Self-pay | Admitting: "Endocrinology

## 2023-09-04 ENCOUNTER — Ambulatory Visit: Payer: Medicare Other | Admitting: "Endocrinology

## 2023-09-04 VITALS — BP 126/74 | HR 80 | Ht 64.0 in | Wt 188.4 lb

## 2023-09-04 DIAGNOSIS — Z794 Long term (current) use of insulin: Secondary | ICD-10-CM

## 2023-09-04 DIAGNOSIS — E782 Mixed hyperlipidemia: Secondary | ICD-10-CM | POA: Diagnosis not present

## 2023-09-04 DIAGNOSIS — I10 Essential (primary) hypertension: Secondary | ICD-10-CM | POA: Diagnosis not present

## 2023-09-04 DIAGNOSIS — E6609 Other obesity due to excess calories: Secondary | ICD-10-CM | POA: Diagnosis not present

## 2023-09-04 DIAGNOSIS — E1122 Type 2 diabetes mellitus with diabetic chronic kidney disease: Secondary | ICD-10-CM | POA: Diagnosis not present

## 2023-09-04 DIAGNOSIS — N1831 Chronic kidney disease, stage 3a: Secondary | ICD-10-CM

## 2023-09-04 DIAGNOSIS — Z6832 Body mass index (BMI) 32.0-32.9, adult: Secondary | ICD-10-CM

## 2023-09-04 LAB — TSH: TSH: 2.49 u[IU]/mL (ref 0.450–4.500)

## 2023-09-04 LAB — COMPREHENSIVE METABOLIC PANEL
ALT: 46 IU/L — ABNORMAL HIGH (ref 0–44)
AST: 27 IU/L (ref 0–40)
Albumin: 4.2 g/dL (ref 3.8–4.8)
Alkaline Phosphatase: 135 IU/L — ABNORMAL HIGH (ref 44–121)
BUN/Creatinine Ratio: 17 (ref 10–24)
BUN: 25 mg/dL (ref 8–27)
Bilirubin Total: 0.2 mg/dL (ref 0.0–1.2)
CO2: 24 mmol/L (ref 20–29)
Calcium: 9 mg/dL (ref 8.6–10.2)
Chloride: 98 mmol/L (ref 96–106)
Creatinine, Ser: 1.46 mg/dL — ABNORMAL HIGH (ref 0.76–1.27)
Globulin, Total: 2.4 g/dL (ref 1.5–4.5)
Glucose: 118 mg/dL — ABNORMAL HIGH (ref 70–99)
Potassium: 4.4 mmol/L (ref 3.5–5.2)
Sodium: 137 mmol/L (ref 134–144)
Total Protein: 6.6 g/dL (ref 6.0–8.5)
eGFR: 50 mL/min/{1.73_m2} — ABNORMAL LOW (ref >59)

## 2023-09-04 LAB — LIPID PANEL
Chol/HDL Ratio: 3.5 ratio (ref 0.0–5.0)
Cholesterol, Total: 194 mg/dL (ref 100–199)
HDL: 56 mg/dL (ref >39)
LDL Chol Calc (NIH): 103 mg/dL — ABNORMAL HIGH (ref 0–99)
Triglycerides: 204 mg/dL — ABNORMAL HIGH (ref 0–149)
VLDL Cholesterol Cal: 35 mg/dL (ref 5–40)

## 2023-09-04 LAB — POCT GLYCOSYLATED HEMOGLOBIN (HGB A1C): HbA1c, POC (controlled diabetic range): 7.1 % — AB (ref 0.0–7.0)

## 2023-09-04 LAB — T4, FREE: Free T4: 0.82 ng/dL (ref 0.82–1.77)

## 2023-09-04 MED ORDER — TIRZEPATIDE 5 MG/0.5ML ~~LOC~~ SOAJ: 5.0000 mg | SUBCUTANEOUS | 1 refills | Status: DC

## 2023-09-04 MED ORDER — TRESIBA FLEXTOUCH 100 UNIT/ML ~~LOC~~ SOPN: 40.0000 [IU] | PEN_INJECTOR | Freq: Every day | SUBCUTANEOUS | 1 refills | Status: DC

## 2023-09-04 NOTE — Patient Instructions (Signed)

## 2023-09-04 NOTE — Progress Notes (Signed)
09/04/2023, 12:35 PM  Endocrinology follow-up note   Subjective:    Patient ID: Jason Salazar, male    DOB: [birth year].  Jason Salazar is being seen in consultation for management of currently uncontrolled symptomatic diabetes requested by  Fleet Contras, MD.   Past Medical History:  Diagnosis Date   Allergic rhinitis    Balantidiasis    Cancer [facility])    prostate cancer treated 2007 with radiation seed placement   Diabetes (HCC)    H/O prostate cancer    Radioactive seeds   Hypercholesteremia    Hyperlipidemia    Hyperlipoproteinemia    Hypertension    Sleep apnea     Past Surgical History:  Procedure Laterality Date   cyst removed from right kidney      Social History   Socioeconomic History   Marital status: Married    Spouse name: Lanora Manis   Number of children: 1   Years of education: HS   Highest education level: Not on file  Occupational History    Employer: [employer]  Tobacco Use   Smoking status: Never   Smokeless tobacco: Never  Vaping Use   Vaping status: Never Used  Substance and Sexual Activity   Alcohol use: No   Drug use: No   Sexual activity: Not on file  Other Topics Concern   Not on file  Social History Narrative   Patient is married to [place] and has one son.   Caffeine Use: none; quit a few months ago             Social Determinants of Health   Financial Resource Strain: Not on file  Food Insecurity: Not on file  Transportation Needs: Not on file  Physical Activity: Not on file  Stress: Not on file  Social Connections: Unknown (04/22/2022)   [facility]   Social Network    Social Network: Not on file    Family History  Problem Relation Age of Onset   CVA Mother    Sleep apnea Brother    Thyroid cancer Father    Prostate cancer Father     Outpatient Encounter Medications as of 09/04/2023   Medication Sig   tirzepatide (MOUNJARO) 5 MG/0.5ML Pen Inject 5 mg into the skin once a week.   allopurinol (ZYLOPRIM) 100 MG tablet Take 100 mg by mouth 2 (two) times daily.   allopurinol (ZYLOPRIM) 100 MG tablet Take 2 tablets (200 mg total) by mouth daily.   aspirin EC 81 MG tablet Take 81 mg by mouth daily.   b complex vitamins tablet Take 1 tablet by mouth daily.   bimatoprost (LUMIGAN) 0.01 % SOLN Instill 1 drop into both eyes at bedtime   Cholecalciferol (VITAMIN D3) 1000 units CAPS Take 1,000 Units by mouth 1 day or 1 dose.   Colchicine 0.6 MG CAPS Take 1 capsule by mouth 2 (two) times daily as needed for gout flare up   dapagliflozin propanediol (FARXIGA) 10 MG TABS tablet Take 1 tablet (10 mg total) by mouth daily.   DILANTIN 100 MG ER capsule Take 4 capsules (400 mg total) by mouth at bedtime.   furosemide (  LASIX) 20 MG tablet Take 1 tablet (20 mg total) by mouth daily.   gabapentin (NEURONTIN) 300 MG capsule Take 1 capsule (300 mg total) by mouth at bedtime. (Patient taking differently: Take 300 mg by mouth as needed.)   insulin degludec (TRESIBA FLEXTOUCH) 100 UNIT/ML FlexTouch Pen Inject 40 Units into the skin at bedtime.   Insulin Pen Needle 31G X 5 MM MISC Use for insulin injection 3 times a day   losartan (COZAAR) 25 MG tablet Take 25 mg by mouth daily.   mometasone (NASONEX) 50 MCG/ACT nasal spray 2 sprays as needed.   Multiple Vitamins-Minerals (ONE-A-DAY MENS 50+ ADVANTAGE PO) Take 1 tablet by mouth daily.   predniSONE (DELTASONE) 20 MG tablet Take by mouth as needed (pain).   rosuvastatin (CRESTOR) 10 MG tablet Take 1 tablet by mouth once daily.   tiZANidine (ZANAFLEX) 4 MG tablet Take 4 mg by mouth 2 (two) times daily as needed.   Vitamin C 250 MG TABS Take 250 mg by mouth 1 day or 1 dose.   Zinc 22.5 MG TABS Take 22.5 mg by mouth 1 day or 1 dose.   [DISCONTINUED] insulin degludec (TRESIBA FLEXTOUCH) 100 UNIT/ML FlexTouch Pen INJECT 50 UNITS INTO THE SKIN AT BEDTIME.    [DISCONTINUED] tirzepatide (MOUNJARO) 2.5 MG/0.5ML Pen Inject 2.5 mg into the skin once a week.   No facility-administered encounter medications on file as of 09/04/2023.    ALLERGIES: Allergies  Allergen Reactions   Atorvastatin Other (See Comments)    Other reaction(s): lightheaded    VACCINATION STATUS: Immunization History  Administered Date(s) Administered   Fluad Quad(high Dose 65+) 09/18/2021   Influenza Split 09/08/2012, 09/08/2013   Influenza, High Dose Seasonal PF 09/02/2019   Influenza-Unspecified 09/08/2012, 08/09/2022   PFIZER(Purple Top)SARS-COV-2 Vaccination 01/22/2020, 02/13/2020   Pfizer Covid-19 Vaccine Bivalent Booster 27yrs & up 10/26/2021   Pfizer(Comirnaty)Fall Seasonal Vaccine 12 years and older 02/06/2023    Diabetes He presents for his follow-up diabetic visit. He has type 2 diabetes mellitus. His disease course has been improving. There are no hypoglycemic associated symptoms. Pertinent negatives for hypoglycemia include no confusion, headaches, pallor or seizures. Associated symptoms include polydipsia and polyuria. Pertinent negatives for diabetes include no chest pain, no fatigue, no polyphagia and no weakness. There are no hypoglycemic complications. Symptoms are improving. 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 (Obesity, hyperlipidemia, hypertension, sedentary life) Risk factors for coronary artery disease include diabetes mellitus, dyslipidemia, family history, hypertension, male sex, obesity and sedentary lifestyle. Current diabetic treatment includes insulin injections and oral agent (monotherapy). His weight is decreasing steadily. He is following a generally unhealthy diet. When asked about meal planning, he reported none. He has not had a previous visit with a dietitian. He rarely participates in exercise. His home blood glucose trend is decreasing steadily. His breakfast blood glucose range is generally 140-180 mg/dl. His lunch blood  glucose range is generally 140-180 mg/dl. His dinner blood glucose range is generally 140-180 mg/dl. His bedtime blood glucose range is generally 140-180 mg/dl. His overall blood glucose range is 140-180 mg/dl. (He presents with significant improvement in his glycemic profile averaging 149 for the last 14 days.  His freestyle libre AGP shows 79% time in range, 19% level 1 hyperglycemia, 2% level 2 hyperglycemia.  He did not document any significant hypoglycemia.  His point-of-care A1c is 7.1% progressively improving from 10%.   ) An ACE inhibitor/angiotensin II receptor blocker is being taken.  Hyperlipidemia This is a chronic problem. The current episode started  more than 1 year ago. The problem is uncontrolled. Exacerbating diseases include chronic renal disease, diabetes and obesity. Pertinent negatives include no chest pain, myalgias or shortness of breath. Current antihyperlipidemic treatment includes statins. Risk factors for coronary artery disease include dyslipidemia, diabetes mellitus, hypertension, male sex, obesity, a sedentary lifestyle and family history.  Hypertension This is a chronic problem. The current episode started more than 1 year ago. The problem is controlled. Pertinent negatives include no chest pain, headaches, neck pain, palpitations or shortness of breath. Risk factors for coronary artery disease include dyslipidemia, obesity, male gender, diabetes mellitus and sedentary lifestyle. Past treatments include angiotensin blockers. Hypertensive end-organ damage includes kidney disease and CVA. Identifiable causes of hypertension include chronic renal disease.     Review of Systems  Constitutional:  Negative for chills, fatigue, fever and unexpected weight change.  HENT:  Negative for dental problem, mouth sores and trouble swallowing.   Eyes:  Negative for visual disturbance.  Respiratory:  Negative for cough, choking, chest tightness, shortness of breath and wheezing.    Cardiovascular:  Negative for chest pain, palpitations and leg swelling.  Gastrointestinal:  Negative for abdominal distention, abdominal pain, constipation, diarrhea, nausea and vomiting.  Endocrine: Positive for polydipsia and polyuria. Negative for polyphagia.  Genitourinary:  Negative for dysuria, flank pain, hematuria and urgency.  Musculoskeletal:  Negative for back pain, gait problem, myalgias and neck pain.  Skin:  Negative for pallor, rash and wound.  Neurological:  Negative for seizures, syncope, weakness, numbness and headaches.  Psychiatric/Behavioral:  Negative for confusion and dysphoric mood.     Objective:       09/04/2023    8:38 AM 05/30/2023    8:18 AM 05/12/2023    9:31 AM  Vitals with BMI  Height 5\' 4"  5\' 4"  5\' 4"   Weight 188 lbs 6 oz 191 lbs 13 oz 187 lbs 10 oz  BMI 32.32 32.91 32.19  Systolic 126 124 474  Diastolic 74 72 73  Pulse 80 72 81    BP 126/74   Pulse 80   Ht 5\' 4"  (1.626 m)   Wt 188 lb 6.4 oz (85.5 kg)   BMI 32.34 kg/m   Wt Readings from Last 3 Encounters:  09/04/23 188 lb 6.4 oz (85.5 kg)  05/30/23 191 lb 12.8 oz (87 kg)  05/12/23 187 lb 9.6 oz (85.1 kg)       CMP ( most recent) CMP     Component Value Date/Time   NA 137 09/03/2023 0824   K 4.4 09/03/2023 0824   CL 98 09/03/2023 0824   CO2 24 09/03/2023 0824   GLUCOSE 118 (H) 09/03/2023 0824   GLUCOSE 192 (H) 11/12/2022 1031   BUN 25 09/03/2023 0824   CREATININE 1.46 (H) 09/03/2023 0824   CREATININE 1.32 (H) 11/12/2022 1031   CALCIUM 9.0 09/03/2023 0824   PROT 6.6 09/03/2023 0824   ALBUMIN 4.2 09/03/2023 0824   AST 27 09/03/2023 0824   ALT 46 (H) 09/03/2023 0824   ALKPHOS 135 (H) 09/03/2023 0824   BILITOT 0.2 09/03/2023 0824   GFRNONAA 46 (L) 03/19/2021 0913   GFRAA 53 (L) 03/19/2021 0913     Lipid Panel ( most recent) Lipid Panel     Component Value Date/Time   CHOL 194 09/03/2023 0824   TRIG 204 (H) 09/03/2023 0824   HDL 56 09/03/2023 0824   CHOLHDL 3.5  09/03/2023 0824   CHOLHDL 3.2 03/19/2021 0913   LDLCALC 103 (H) 09/03/2023 0824   LDLCALC 83  03/19/2021 0913   LABVLDL 35 09/03/2023 0824      Lab Results  Component Value Date   TSH 2.490 09/03/2023   TSH 2.060 03/06/2023   TSH 1.530 10/09/2021   TSH 1.96 03/19/2021   FREET4 0.82 09/03/2023      Assessment & Plan:   1. Type 2 diabetes mellitus with stage 3a chronic kidney disease, with long-term current use of insulin (HCC)   - Jason Salazar has currently uncontrolled symptomatic type 2 DM since  77 years of age.  He presents with significant improvement in his glycemic profile averaging 149 for the last 14 days.  His freestyle libre AGP shows 79% time in range, 19% level 1 hyperglycemia, 2% level 2 hyperglycemia.  He did not document any significant hypoglycemia.  His point-of-care A1c is 7.1% progressively improving from 10%.    Recent labs reviewed. - I had a long discussion with him about the possible risk factors and  the pathology behind its diabetes and its complications. -his diabetes is complicated by CVA, CKD stage 3a, obesity/sedentary life, comorbid hypertension, hyperlipidemia, and he remains at exceedingly high risk for more acute and chronic complications which include CAD, CVA, CKD, retinopathy, and neuropathy. These are all discussed in detail with him.  - I discussed all available options of managing his diabetes including de-escalation of medications. I have counseled him on Food as Medicine by adopting a Whole Food , Plant Predominant  ( WFPP) nutrition as recommended by [employer] of Lifestyle Medicine. Patient is encouraged to switch to  unprocessed or minimally processed  complex starch, adequate protein intake (mainly plant source), minimal liquid fat, plenty of fruits, and vegetables. -  he is advised to stick to a routine mealtimes to eat 3 complete meals a day and snack only when necessary ( to snack only to correct hypoglycemia BG <70 day time or  <100 at night).   - he acknowledges that there is a room for improvement in his food and drink choices. - Suggestion is made for him to avoid simple carbohydrates  from his diet including Cakes, Sweet Desserts, Ice Cream, Soda (diet and regular), Sweet Tea, Candies, Chips, Cookies, Store Bought Juices, Alcohol , Artificial Sweeteners,  Coffee Creamer, and "Sugar-free" Products, Lemonade. This will help patient to have more stable blood glucose profile and potentially avoid unintended weight gain.  The following Lifestyle Medicine recommendations according to American College of Lifestyle Medicine  [facility]) were discussed and and offered to patient and he  agrees to start the journey:  A. Whole Foods, Plant-Based Nutrition comprising of fruits and vegetables, plant-based proteins, whole-grain carbohydrates was discussed in detail with the patient.   A list for source of those nutrients were also provided to the patient.  Patient will use only water or unsweetened tea for hydration. B.  The need to stay away from risky substances including alcohol, smoking; obtaining 7 to 9 hours of restorative sleep, at least 150 minutes of moderate intensity exercise weekly, the importance of healthy social connections,  and stress management techniques were discussed. C.  A full color page of  Calorie density of various food groups per pound showing examples of each food groups was provided to the patient.    - he will be scheduled with Norm Salt, RDN, CDE for individualized diabetes education.  - I have approached him with the following individualized plan to manage  his diabetes and patient agrees:  -In light of his significant improvement in his glycemic profile, he  will not need prandial insulin for now.  Based on his engagement, he is advised to lower Tresiba to 40 units nightly, associated with utilization of his CGM continuously.   - he is warned not to take insulin without proper monitoring per  orders. - Adjustment parameters are given to him for hypo and hyperglycemia in writing. - he is encouraged to call clinic for blood glucose levels less than 70 or above 200 mg /dl. - he is benefiting from his SGLT2 inhibitors.  He is advised to continue Farxiga 10 mg p.o. daily at breakfast.    -He is also benefiting from his GLP-1 receptor agonist.  I discussed and increase his Mounjaro to 5 mg subcutaneously weekly.   Side effects and precautions discussed with him. -Targets for  A1c;  LDL, HDL,  and Triglycerides were discussed with the patient.  2) Blood Pressure /Hypertension:    -His blood pressure is controlled to target.  he is advised to continue his current medications including clonidine 0.1 mg p.o. daily, losartan 25 mg p.o. daily with breakfast .  3) Lipids/Hyperlipidemia:   Review of his recent lipid panel showed improved LDL to 103 from 145.  He has engaged and advised to continue Crestor 10 mg p.o. nightly.    Side effects and precautions discussed with him.  4) obesity wit class 1-  Body mass index is 32.34 kg/m.  -He is currently losing weight, high BMI clearly complicating his diabetes care.   he is  a candidate for weight loss. I discussed with him the fact that loss of 5 - 10% of his  current body weight will have the most impact on his diabetes management.  The above detailed  ACLM recommendations for nutrition, exercise, sleep, social life, avoidance of risky substances, the need for restorative sleep   information will also detailed on discharge instructions.  5) Chronic Care/Health Maintenance:  -he  is on ACEI/ARB and Statin medications and  is encouraged to initiate and continue to follow up with Ophthalmology, Dentist,  Podiatrist at least yearly or according to recommendations, and advised to   stay away from smoking. I have recommended yearly flu vaccine and pneumonia vaccine at least every 5 years; moderate intensity exercise for up to 150 minutes weekly; and   sleep for 7- 9 hours a day.  - he is  advised to maintain close follow up with Fleet Contras, MD for primary care needs, as well as his other providers for optimal and coordinated care.   I spent  42  minutes in the care of the patient today including review of labs from CMP, Lipids, Thyroid Function, Hematology (current and previous including abstractions from other facilities); face-to-face time discussing  his blood glucose readings/logs, discussing hypoglycemia and hyperglycemia episodes and symptoms, medications doses, his options of short and long term treatment based on the latest standards of care / guidelines;  discussion about incorporating lifestyle medicine;  and documenting the encounter. Risk reduction counseling performed per USPSTF guidelines to reduce  obesity and cardiovascular risk factors.     Please refer to Patient Instructions for Blood Glucose Monitoring and Insulin/Medications Dosing Guide"  in media tab for additional information. Please  also refer to " Patient Self Inventory" in the Media  tab for reviewed elements of pertinent patient history.  [name] participated in the discussions, expressed understanding, and voiced agreement with the above plans.  All questions were answered to his satisfaction. he is encouraged to contact clinic should he have any  questions or concerns prior to his return visit.    Follow up plan: - Return in about 4 months (around 01/04/2024) for A1c -NV, Urine MA - NV.  Marquis Lunch, MD [facility] [address] [place], [place] [address] Phone: [phone]  Fax: [phone]    09/04/2023, 12:35 PM  This note was partially dictated with voice recognition software. Similar sounding words can be transcribed inadequately or may not  be corrected upon review.

## 2023-09-08 ENCOUNTER — Encounter: Payer: Self-pay | Admitting: Internal Medicine

## 2023-09-08 NOTE — Telephone Encounter (Signed)
Order was sent to [facility] on 09/01/23 for replacement Cpap

## 2023-09-10 NOTE — Progress Notes (Signed)
Carelink Summary Report / Loop Recorder 

## 2023-09-14 ENCOUNTER — Other Ambulatory Visit: Payer: Self-pay | Admitting: "Endocrinology

## 2023-09-17 ENCOUNTER — Other Ambulatory Visit: Payer: Self-pay

## 2023-09-17 MED ORDER — INSULIN PEN NEEDLE 31G X 5 MM MISC: 5 refills | Status: DC
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
## 2023-09-17 NOTE — Patient Instructions (Signed)
Below is our plan:  We will ontinue Dilantin ER 400mg  daily at bedtime.   Please make sure you are consistent with timing of seizure medication. I recommend annual visit with primary care provider (PCP) for complete physical and routine blood work. I recommend daily intake of vitamin D (400-800iu) and calcium (800-1000mg ) for bone health. Discuss Dexa screening with PCP.   According to [place] law, you can not drive unless you are seizure / syncope free for at least 6 months and under physician's care.  Please maintain precautions. Do not participate in activities where a loss of awareness could harm you or someone else. No swimming alone, no tub bathing, no hot tubs, no driving, no operating motorized vehicles (cars, ATVs, motocycles, etc), lawnmowers, power tools or firearms. No standing at heights, such as rooftops, ladders or stairs. Avoid hot objects such as stoves, heaters, open fires. Wear a helmet when riding a bicycle, scooter, skateboard, etc. and avoid areas of traffic. Set your water heater to 120 degrees or less.  SUDEP is the sudden, unexpected death of someone with epilepsy, who was otherwise healthy. In SUDEP cases, no other cause of death is found when an autopsy is done. Each year, more than 1 in 1,000 people with epilepsy die from SUDEP. This is the leading cause of death in people with uncontrolled seizures. Until further answers are available, the best way to prevent SUDEP is to lower your risk by controlling seizures. Research has found that people with all types of epilepsy that experience convulsive seizures can be at risk.  Please make sure you are staying well hydrated. I recommend 50-60 ounces daily. Well balanced diet and regular exercise encouraged. Consistent sleep schedule with 6-8 hours recommended.   Please continue follow up with care team as directed.   Follow up with me in 1 year   You may receive a survey regarding today's visit. I encourage you to leave honest feed  back as I do use this information to improve patient care. Thank you for seeing me today!

## 2023-09-21 ENCOUNTER — Encounter (HOSPITAL_COMMUNITY): Payer: Self-pay | Admitting: *Deleted

## 2023-09-21 ENCOUNTER — Ambulatory Visit (HOSPITAL_COMMUNITY)
Admission: EM | Admit: 2023-09-21 | Discharge: 2023-09-21 | Disposition: A | Discharge status: Home / Self Care | Payer: Medicare Other

## 2023-09-21 DIAGNOSIS — T162XXA Foreign body in left ear, initial encounter: Secondary | ICD-10-CM

## 2023-09-21 NOTE — Discharge Instructions (Signed)
We remove the foreign body today in clinic without complication.  Use caution using her hearing aids in the future.

## 2023-09-21 NOTE — ED Provider Notes (Signed)
Patient presents to clinic for complaints of retained piece of plastic from his hearing aid that he removed about an hour prior to arrival.  This is the first time that this has happened.  His wife looked and could see the piece but did not want to shove it further into the canal so they present to clinic.  Otoscope shows retained plastic piece, removed with tweezers.  Tympanic membrane intact and pearly gray.  No retained foreign body after plastic removal.  Encouraged to replace plastic piece and use caution in the future.  Return precautions given.  No questions at this time.   Curran Lenderman, [place], [place] 09/21/23 [phone]

## 2023-09-21 NOTE — ED Triage Notes (Signed)
Pt reports removing hearing aid from left ear approx 1 hr ago, but it appears a plastic piece was left in his ear. DEnies any pain.

## 2023-09-22 ENCOUNTER — Encounter: Payer: Self-pay | Admitting: Family Medicine

## 2023-09-22 ENCOUNTER — Ambulatory Visit (INDEPENDENT_AMBULATORY_CARE_PROVIDER_SITE_OTHER): Payer: Medicare Other | Admitting: Family Medicine

## 2023-09-22 VITALS — BP 143/82 | HR 88 | Ht 64.0 in | Wt 188.5 lb

## 2023-09-22 DIAGNOSIS — I441 Atrioventricular block, second degree: Secondary | ICD-10-CM

## 2023-09-22 DIAGNOSIS — G40309 Generalized idiopathic epilepsy and epileptic syndromes, not intractable, without status epilepticus: Secondary | ICD-10-CM | POA: Diagnosis not present

## 2023-09-22 DIAGNOSIS — R4020 Unspecified coma: Secondary | ICD-10-CM | POA: Diagnosis not present

## 2023-09-22 MED ORDER — DILANTIN 100 MG PO CAPS: 400.0000 mg | ORAL_CAPSULE | Freq: Every day | ORAL | 4 refills | Status: DC

## 2023-09-23 LAB — PHENYTOIN LEVEL, TOTAL: Phenytoin (Dilantin), Serum: 25.9 ug/mL (ref 10.0–20.0)

## 2023-09-24 ENCOUNTER — Ambulatory Visit: Payer: Medicare Other | Attending: Internal Medicine | Admitting: Internal Medicine

## 2023-09-24 ENCOUNTER — Encounter: Payer: Self-pay | Admitting: Internal Medicine

## 2023-09-24 VITALS — BP 120/60 | HR 87 | Ht 64.0 in | Wt 189.0 lb

## 2023-09-24 DIAGNOSIS — R600 Localized edema: Secondary | ICD-10-CM

## 2023-09-24 DIAGNOSIS — G4733 Obstructive sleep apnea (adult) (pediatric): Secondary | ICD-10-CM

## 2023-09-24 DIAGNOSIS — Z87898 Personal history of other specified conditions: Secondary | ICD-10-CM | POA: Insufficient documentation

## 2023-09-24 DIAGNOSIS — I5033 Acute on chronic diastolic (congestive) heart failure: Secondary | ICD-10-CM | POA: Diagnosis not present

## 2023-09-24 DIAGNOSIS — I441 Atrioventricular block, second degree: Secondary | ICD-10-CM

## 2023-09-24 DIAGNOSIS — I10 Essential (primary) hypertension: Secondary | ICD-10-CM

## 2023-09-24 MED ORDER — SPIRONOLACTONE 25 MG PO TABS
12.5000 mg | ORAL_TABLET | Freq: Every day | ORAL | 3 refills | Status: DC
  Filled 2024-06-22: qty 45, 90d supply, fill #0

## 2023-09-24 NOTE — Patient Instructions (Addendum)
Medication Instructions:  Your physician has recommended you make the following change in your medication:  START: spironolactone (Aldactone) 12. 5 mg by mouth once daily. Half a pill  Please monitor your BP and notify our office if it is consistently low Below 110  *If you need a refill on your cardiac medications before your next appointment, please call your pharmacy*   Lab Work: IN 7-10 DAYS: BNP, BMP  If you have labs (blood work) drawn today and your tests are completely normal, you will receive your results only by: MyChart Message (if you have MyChart) OR A paper copy in the mail If you have any lab test that is abnormal or we need to change your treatment, we will call you to review the results.   Testing/Procedures: NONE   Follow-Up: At [facility], you and your health needs are our priority.  As part of our continuing mission to provide you with exceptional heart care, we have created designated Provider Care Teams.  These Care Teams include your primary Cardiologist (physician) and Advanced Practice Providers (APPs -  Physician Assistants and Nurse Practitioners) who all work together to provide you with the care you need, when you need it.    Your next appointment:   5-6 month(s)  Provider:   Christell Constant, MD

## 2023-09-24 NOTE — Progress Notes (Signed)
Cardiology Office Note:    Date:  09/24/2023   ID:  [name], DOB 12-11-1945, MRN [id number]  PCP:  Fleet Contras, MD  Cardiologist:  Christell Constant, MD   Referring MD: Fleet Contras, MD   CC: LE follow up  History of Present Illness:    Jason Salazar is a 77 y.o. 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  2023: had syncope.  S/p ILR.  Dr. Katrinka Blazing had wanted to do testing for cardiac sarcoidosis. 2024: Testing negative for infiltrative disease . Discussed the use of AI scribe software for clinical note transcription with the patient, who gave verbal consent to proceed.  Jason Salazar, a 77 year old male with a history of AV node conduction system with Mobitz type one heart block, hypertension, hyperlipidemia, type two diabetes, and obstructive sleep apnea, presents for follow-up. I The patient has been experiencing lower extremity edema, which led to an increase in his Lasix dosage. Despite this, he reports persistent daily leg swelling, which is more pronounced in his right leg. He also notes a recent weight gain. Despite these symptoms, he denies any chest pain or breathing issues and feels he can do most of the activities he wishes to, albeit with self-imposed limitations due to a prior accident, evaluated by Dr. Katrinka Blazing  His current medication regimen includes aspirin 81mg  daily, Farxiga, Lasix 20mg , losartan (Cozaar), and rosuvastatin 10mg . He reports adherence to this regimen and denies any new medications. He also reports a dietary change, having stopped using salt.  He comes with his wife who works at [employer].   In 2023, the patient experienced an unexplained syncopal event, but has had no further episodes since. He has an implantable loop recorder, which has not shown any abnormalities.   Past Medical  History:  Diagnosis Date   Allergic rhinitis    Balantidiasis    Cancer [facility])    prostate cancer treated 2007 with radiation seed placement   Diabetes (HCC)    H/O prostate cancer    Radioactive seeds   Hypercholesteremia    Hyperlipidemia    Hyperlipoproteinemia    Hypertension    Sleep apnea     Past Surgical History:  Procedure Laterality Date   cyst removed from right kidney      Current Medications: Current Meds  Medication Sig   allopurinol (ZYLOPRIM) 100 MG tablet Take 100 mg by mouth 2 (two) times daily.   aspirin EC 81 MG tablet Take 81 mg by mouth daily.   b complex vitamins tablet Take 1 tablet by mouth daily.   bimatoprost (LUMIGAN) 0.01 % SOLN Instill 1 drop into both eyes at bedtime   Cholecalciferol (VITAMIN D3) 1000 units CAPS Take 1,000 Units by mouth 1 day or 1 dose.   Colchicine 0.6 MG CAPS Take 1 capsule by mouth 2 (two) times daily as needed for gout flare up   dapagliflozin propanediol (FARXIGA) 10 MG TABS tablet Take 1 tablet (10 mg total) by mouth daily.   DILANTIN 100 MG ER capsule Take 4 capsules (400 mg total) by mouth at bedtime.   furosemide (LASIX) 20 MG tablet Take 1 tablet (20 mg total) by mouth daily.   gabapentin (NEURONTIN) 300 MG capsule Take 1 capsule (300 mg total) by mouth at bedtime. (Patient taking differently: Take 300 mg by mouth as needed (pain).)   insulin degludec (TRESIBA FLEXTOUCH) 100  Cardiology Office Note:    Date:  09/24/2023   ID:  [name], DOB 12-11-1945, MRN [id number]  PCP:  Fleet Contras, MD  Cardiologist:  Christell Constant, MD   Referring MD: Fleet Contras, MD   CC: LE follow up  History of Present Illness:    Jason Salazar is a 77 y.o. 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  2023: had syncope.  S/p ILR.  Dr. Katrinka Blazing had wanted to do testing for cardiac sarcoidosis. 2024: Testing negative for infiltrative disease . Discussed the use of AI scribe software for clinical note transcription with the patient, who gave verbal consent to proceed.  Jason Salazar, a 77 year old male with a history of AV node conduction system with Mobitz type one heart block, hypertension, hyperlipidemia, type two diabetes, and obstructive sleep apnea, presents for follow-up. I The patient has been experiencing lower extremity edema, which led to an increase in his Lasix dosage. Despite this, he reports persistent daily leg swelling, which is more pronounced in his right leg. He also notes a recent weight gain. Despite these symptoms, he denies any chest pain or breathing issues and feels he can do most of the activities he wishes to, albeit with self-imposed limitations due to a prior accident, evaluated by Dr. Katrinka Blazing  His current medication regimen includes aspirin 81mg  daily, Farxiga, Lasix 20mg , losartan (Cozaar), and rosuvastatin 10mg . He reports adherence to this regimen and denies any new medications. He also reports a dietary change, having stopped using salt.  He comes with his wife who works at [employer].   In 2023, the patient experienced an unexplained syncopal event, but has had no further episodes since. He has an implantable loop recorder, which has not shown any abnormalities.   Past Medical  History:  Diagnosis Date   Allergic rhinitis    Balantidiasis    Cancer [facility])    prostate cancer treated 2007 with radiation seed placement   Diabetes (HCC)    H/O prostate cancer    Radioactive seeds   Hypercholesteremia    Hyperlipidemia    Hyperlipoproteinemia    Hypertension    Sleep apnea     Past Surgical History:  Procedure Laterality Date   cyst removed from right kidney      Current Medications: Current Meds  Medication Sig   allopurinol (ZYLOPRIM) 100 MG tablet Take 100 mg by mouth 2 (two) times daily.   aspirin EC 81 MG tablet Take 81 mg by mouth daily.   b complex vitamins tablet Take 1 tablet by mouth daily.   bimatoprost (LUMIGAN) 0.01 % SOLN Instill 1 drop into both eyes at bedtime   Cholecalciferol (VITAMIN D3) 1000 units CAPS Take 1,000 Units by mouth 1 day or 1 dose.   Colchicine 0.6 MG CAPS Take 1 capsule by mouth 2 (two) times daily as needed for gout flare up   dapagliflozin propanediol (FARXIGA) 10 MG TABS tablet Take 1 tablet (10 mg total) by mouth daily.   DILANTIN 100 MG ER capsule Take 4 capsules (400 mg total) by mouth at bedtime.   furosemide (LASIX) 20 MG tablet Take 1 tablet (20 mg total) by mouth daily.   gabapentin (NEURONTIN) 300 MG capsule Take 1 capsule (300 mg total) by mouth at bedtime. (Patient taking differently: Take 300 mg by mouth as needed (pain).)   insulin degludec (TRESIBA FLEXTOUCH) 100  Cardiology Office Note:    Date:  09/24/2023   ID:  [name], DOB 12-11-1945, MRN [id number]  PCP:  Fleet Contras, MD  Cardiologist:  Christell Constant, MD   Referring MD: Fleet Contras, MD   CC: LE follow up  History of Present Illness:    Jason Salazar is a 77 y.o. 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  2023: had syncope.  S/p ILR.  Dr. Katrinka Blazing had wanted to do testing for cardiac sarcoidosis. 2024: Testing negative for infiltrative disease . Discussed the use of AI scribe software for clinical note transcription with the patient, who gave verbal consent to proceed.  Jason Salazar, a 77 year old male with a history of AV node conduction system with Mobitz type one heart block, hypertension, hyperlipidemia, type two diabetes, and obstructive sleep apnea, presents for follow-up. I The patient has been experiencing lower extremity edema, which led to an increase in his Lasix dosage. Despite this, he reports persistent daily leg swelling, which is more pronounced in his right leg. He also notes a recent weight gain. Despite these symptoms, he denies any chest pain or breathing issues and feels he can do most of the activities he wishes to, albeit with self-imposed limitations due to a prior accident, evaluated by Dr. Katrinka Blazing  His current medication regimen includes aspirin 81mg  daily, Farxiga, Lasix 20mg , losartan (Cozaar), and rosuvastatin 10mg . He reports adherence to this regimen and denies any new medications. He also reports a dietary change, having stopped using salt.  He comes with his wife who works at [employer].   In 2023, the patient experienced an unexplained syncopal event, but has had no further episodes since. He has an implantable loop recorder, which has not shown any abnormalities.   Past Medical  History:  Diagnosis Date   Allergic rhinitis    Balantidiasis    Cancer [facility])    prostate cancer treated 2007 with radiation seed placement   Diabetes (HCC)    H/O prostate cancer    Radioactive seeds   Hypercholesteremia    Hyperlipidemia    Hyperlipoproteinemia    Hypertension    Sleep apnea     Past Surgical History:  Procedure Laterality Date   cyst removed from right kidney      Current Medications: Current Meds  Medication Sig   allopurinol (ZYLOPRIM) 100 MG tablet Take 100 mg by mouth 2 (two) times daily.   aspirin EC 81 MG tablet Take 81 mg by mouth daily.   b complex vitamins tablet Take 1 tablet by mouth daily.   bimatoprost (LUMIGAN) 0.01 % SOLN Instill 1 drop into both eyes at bedtime   Cholecalciferol (VITAMIN D3) 1000 units CAPS Take 1,000 Units by mouth 1 day or 1 dose.   Colchicine 0.6 MG CAPS Take 1 capsule by mouth 2 (two) times daily as needed for gout flare up   dapagliflozin propanediol (FARXIGA) 10 MG TABS tablet Take 1 tablet (10 mg total) by mouth daily.   DILANTIN 100 MG ER capsule Take 4 capsules (400 mg total) by mouth at bedtime.   furosemide (LASIX) 20 MG tablet Take 1 tablet (20 mg total) by mouth daily.   gabapentin (NEURONTIN) 300 MG capsule Take 1 capsule (300 mg total) by mouth at bedtime. (Patient taking differently: Take 300 mg by mouth as needed (pain).)   insulin degludec (TRESIBA FLEXTOUCH) 100  UNIT/ML FlexTouch Pen Inject 40 Units into the skin at bedtime.   Insulin Pen Needle 31G X 5 MM MISC Use with insulin at bedtime. E11.65   losartan (COZAAR) 25 MG tablet Take 25 mg by mouth daily.   mometasone (NASONEX) 50 MCG/ACT nasal spray 2 sprays as needed.   Multiple Vitamins-Minerals (ONE-A-DAY MENS 50+ ADVANTAGE PO) Take 1 tablet by mouth daily.   predniSONE (DELTASONE) 20 MG tablet Take by mouth as needed (pain).   rosuvastatin (CRESTOR) 10 MG tablet Take 1 tablet by mouth once daily.   spironolactone (ALDACTONE) 25 MG tablet Take 0.5  tablets (12.5 mg total) by mouth daily.   tiZANidine (ZANAFLEX) 4 MG tablet Take 4 mg by mouth 2 (two) times daily as needed.   Vitamin C 250 MG TABS Take 250 mg by mouth 1 day or 1 dose.   Zinc 22.5 MG TABS Take 22.5 mg by mouth 1 day or 1 dose.     Allergies:   Atorvastatin   Social History   Socioeconomic History   Marital status: Married    Spouse name: Lanora Manis   Number of children: 1   Years of education: HS   Highest education level: Not on file  Occupational History    Employer: [employer]  Tobacco Use   Smoking status: Never   Smokeless tobacco: Never  Vaping Use   Vaping status: Never Used  Substance and Sexual Activity   Alcohol use: No   Drug use: No   Sexual activity: Not on file  Other Topics Concern   Not on file  Social History Narrative   Patient is married to [place] and has one son.   Caffeine Use: none; quit a few months ago             Social Determinants of Corporate investment banker Strain: Not on file  Food Insecurity: Not on file  Transportation Needs: Not on file  Physical Activity: Not on file  Stress: Not on file  Social Connections: Unknown (04/22/2022)   Received from [facility], [facility]   Social Network    Social Network: Not on file     Family History: The patient's family history includes CVA in his mother; Prostate cancer in his father; Sleep apnea in his brother; Thyroid cancer in his father.  ROS:   Please see the history of present illness.     EKGs/Labs/Other Studies Reviewed:     Cardiac Studies & Procedures       ECHOCARDIOGRAM  ECHOCARDIOGRAM COMPLETE 12/12/2022  Narrative ECHOCARDIOGRAM REPORT    Patient Name:   TAVIEN CHESTNUT Date of Exam: 12/12/2022 Medical Rec #:  [id number]       Height:       64.0 in Accession #:    [id number]      Weight:       184.0 lb Date of Birth:  Apr 20, 1946      BSA:          1.888 m Patient Age:    76 years        BP:           156/90 mmHg Patient Gender:  M               HR:           76 bpm. Exam Location:  [address]  Procedure: 2D Echo, 3D Echo, Cardiac Doppler and Color Doppler  Indications:    R01.1 Murmur  History:  Cardiology Office Note:    Date:  09/24/2023   ID:  [name], DOB 12-11-1945, MRN [id number]  PCP:  Fleet Contras, MD  Cardiologist:  Christell Constant, MD   Referring MD: Fleet Contras, MD   CC: LE follow up  History of Present Illness:    Jason Salazar is a 77 y.o. 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  2023: had syncope.  S/p ILR.  Dr. Katrinka Blazing had wanted to do testing for cardiac sarcoidosis. 2024: Testing negative for infiltrative disease . Discussed the use of AI scribe software for clinical note transcription with the patient, who gave verbal consent to proceed.  Jason Salazar, a 77 year old male with a history of AV node conduction system with Mobitz type one heart block, hypertension, hyperlipidemia, type two diabetes, and obstructive sleep apnea, presents for follow-up. I The patient has been experiencing lower extremity edema, which led to an increase in his Lasix dosage. Despite this, he reports persistent daily leg swelling, which is more pronounced in his right leg. He also notes a recent weight gain. Despite these symptoms, he denies any chest pain or breathing issues and feels he can do most of the activities he wishes to, albeit with self-imposed limitations due to a prior accident, evaluated by Dr. Katrinka Blazing  His current medication regimen includes aspirin 81mg  daily, Farxiga, Lasix 20mg , losartan (Cozaar), and rosuvastatin 10mg . He reports adherence to this regimen and denies any new medications. He also reports a dietary change, having stopped using salt.  He comes with his wife who works at [employer].   In 2023, the patient experienced an unexplained syncopal event, but has had no further episodes since. He has an implantable loop recorder, which has not shown any abnormalities.   Past Medical  History:  Diagnosis Date   Allergic rhinitis    Balantidiasis    Cancer [facility])    prostate cancer treated 2007 with radiation seed placement   Diabetes (HCC)    H/O prostate cancer    Radioactive seeds   Hypercholesteremia    Hyperlipidemia    Hyperlipoproteinemia    Hypertension    Sleep apnea     Past Surgical History:  Procedure Laterality Date   cyst removed from right kidney      Current Medications: Current Meds  Medication Sig   allopurinol (ZYLOPRIM) 100 MG tablet Take 100 mg by mouth 2 (two) times daily.   aspirin EC 81 MG tablet Take 81 mg by mouth daily.   b complex vitamins tablet Take 1 tablet by mouth daily.   bimatoprost (LUMIGAN) 0.01 % SOLN Instill 1 drop into both eyes at bedtime   Cholecalciferol (VITAMIN D3) 1000 units CAPS Take 1,000 Units by mouth 1 day or 1 dose.   Colchicine 0.6 MG CAPS Take 1 capsule by mouth 2 (two) times daily as needed for gout flare up   dapagliflozin propanediol (FARXIGA) 10 MG TABS tablet Take 1 tablet (10 mg total) by mouth daily.   DILANTIN 100 MG ER capsule Take 4 capsules (400 mg total) by mouth at bedtime.   furosemide (LASIX) 20 MG tablet Take 1 tablet (20 mg total) by mouth daily.   gabapentin (NEURONTIN) 300 MG capsule Take 1 capsule (300 mg total) by mouth at bedtime. (Patient taking differently: Take 300 mg by mouth as needed (pain).)   insulin degludec (TRESIBA FLEXTOUCH) 100  UNIT/ML FlexTouch Pen Inject 40 Units into the skin at bedtime.   Insulin Pen Needle 31G X 5 MM MISC Use with insulin at bedtime. E11.65   losartan (COZAAR) 25 MG tablet Take 25 mg by mouth daily.   mometasone (NASONEX) 50 MCG/ACT nasal spray 2 sprays as needed.   Multiple Vitamins-Minerals (ONE-A-DAY MENS 50+ ADVANTAGE PO) Take 1 tablet by mouth daily.   predniSONE (DELTASONE) 20 MG tablet Take by mouth as needed (pain).   rosuvastatin (CRESTOR) 10 MG tablet Take 1 tablet by mouth once daily.   spironolactone (ALDACTONE) 25 MG tablet Take 0.5  tablets (12.5 mg total) by mouth daily.   tiZANidine (ZANAFLEX) 4 MG tablet Take 4 mg by mouth 2 (two) times daily as needed.   Vitamin C 250 MG TABS Take 250 mg by mouth 1 day or 1 dose.   Zinc 22.5 MG TABS Take 22.5 mg by mouth 1 day or 1 dose.     Allergies:   Atorvastatin   Social History   Socioeconomic History   Marital status: Married    Spouse name: Lanora Manis   Number of children: 1   Years of education: HS   Highest education level: Not on file  Occupational History    Employer: [employer]  Tobacco Use   Smoking status: Never   Smokeless tobacco: Never  Vaping Use   Vaping status: Never Used  Substance and Sexual Activity   Alcohol use: No   Drug use: No   Sexual activity: Not on file  Other Topics Concern   Not on file  Social History Narrative   Patient is married to [place] and has one son.   Caffeine Use: none; quit a few months ago             Social Determinants of Corporate investment banker Strain: Not on file  Food Insecurity: Not on file  Transportation Needs: Not on file  Physical Activity: Not on file  Stress: Not on file  Social Connections: Unknown (04/22/2022)   Received from [facility], [facility]   Social Network    Social Network: Not on file     Family History: The patient's family history includes CVA in his mother; Prostate cancer in his father; Sleep apnea in his brother; Thyroid cancer in his father.  ROS:   Please see the history of present illness.     EKGs/Labs/Other Studies Reviewed:     Cardiac Studies & Procedures       ECHOCARDIOGRAM  ECHOCARDIOGRAM COMPLETE 12/12/2022  Narrative ECHOCARDIOGRAM REPORT    Patient Name:   TAVIEN CHESTNUT Date of Exam: 12/12/2022 Medical Rec #:  [id number]       Height:       64.0 in Accession #:    [id number]      Weight:       184.0 lb Date of Birth:  Apr 20, 1946      BSA:          1.888 m Patient Age:    76 years        BP:           156/90 mmHg Patient Gender:  M               HR:           76 bpm. Exam Location:  [address]  Procedure: 2D Echo, 3D Echo, Cardiac Doppler and Color Doppler  Indications:    R01.1 Murmur  History:

## 2023-09-25 NOTE — Telephone Encounter (Signed)
[facility] calling in for the prescription sent in for the patient

## 2023-09-26 NOTE — Telephone Encounter (Signed)
I spoke with Jason Salazar with [facility] and he confirmed that they have the documentation

## 2023-09-26 NOTE — Telephone Encounter (Signed)
I have faxed the referral,note, sleep study to [facility]

## 2023-09-29 ENCOUNTER — Ambulatory Visit: Payer: Medicare Other

## 2023-09-29 DIAGNOSIS — I441 Atrioventricular block, second degree: Secondary | ICD-10-CM

## 2023-09-29 LAB — CUP PACEART REMOTE DEVICE CHECK
Date Time Interrogation Session: 20241020231532
Implantable Pulse Generator Implant Date: 20231128

## 2023-10-06 ENCOUNTER — Ambulatory Visit: Payer: Medicare Other

## 2023-10-07 ENCOUNTER — Ambulatory Visit: Payer: Medicare Other | Attending: Internal Medicine

## 2023-10-07 DIAGNOSIS — I5033 Acute on chronic diastolic (congestive) heart failure: Secondary | ICD-10-CM

## 2023-10-07 DIAGNOSIS — R600 Localized edema: Secondary | ICD-10-CM

## 2023-10-08 LAB — BASIC METABOLIC PANEL
BUN/Creatinine Ratio: 19 (ref 10–24)
BUN: 24 mg/dL (ref 8–27)
CO2: 22 mmol/L (ref 20–29)
Calcium: 9 mg/dL (ref 8.6–10.2)
Chloride: 102 mmol/L (ref 96–106)
Creatinine, Ser: 1.28 mg/dL — ABNORMAL HIGH (ref 0.76–1.27)
Glucose: 80 mg/dL (ref 70–99)
Potassium: 4.5 mmol/L (ref 3.5–5.2)
Sodium: 142 mmol/L (ref 134–144)
eGFR: 58 mL/min/{1.73_m2} — ABNORMAL LOW (ref >59)

## 2023-10-08 LAB — PRO B NATRIURETIC PEPTIDE: NT-Pro BNP: 84 pg/mL (ref 0–486)

## 2023-10-10 NOTE — Progress Notes (Unsigned)
HPI 4 yoM never smoker followed for OSA, Irregular sleep schedule, complicated by HTN, OSA, Allergic Rhinitis, DM, Epilepsy, CKD3, hx Prostate Cancer/ seeds, Hypercholesterolemia, Glaucoma,  NPSG 01/31/13- AHI 123.3/ hr, desaturation to 81%, CPAP to 13, Body weight 210 lbs Split NPSG 10/23/22- AHI 42.8/hr, desat to 85%, body weight 180 lbs, CPAP to 11.  ======================================================   03/11/22- 75 yoM never smoker followed for OSA, Irregular sleep schedule, complicated by HTN, OSA, Allergic Rhinitis, DM, Epilepsy, CKD3,  hx Prostate Cancer/ seeds, Hypercholesterolemia, Glaucoma,  CPAP auto 5-20/ [facility]   AirSense 10 AutoSet Download-compliance 97%, AHI 3.1/ hr Body weight today-181 lbs Covid vax-3 Phizer Flu vax-had Wife had called in Feb reporting that pt wears CPAP but still falls asleep frequently in day. He feels that he is not getting enough pressure on the CPAP.  Download reviewed with him. 1 cup morning coffee. Dozes off if sits, but denies formal naps. Thinks he sleeps through the night. Bedtime midnight with latency 30 minutes. No sleep meds. Denies cataplexy, sleep paralysis.  10/13/23- 76 yoM never smoker followed for OSA, Irregular sleep schedule, complicated by HTN, OSA, Allergic Rhinitis, DM, Epilepsy, CKD3, hx Prostate Cancer/ seeds, Hypercholesterolemia, Glaucoma,  Split NPSG 10/23/22- AHI 42.8/hr, desat to 85%, body weight 180 lbs, CPAP to 11. CPAP auto 5-20/ [facility] (changed for insurance)   replaced 08/30/23 Download-compliance  Body weight today-   ROS-see HPI   + = positive Constitutional:    weight loss, night sweats, fevers, chills, fatigue, lassitude. HEENT:    headaches, difficulty swallowing, +tooth/dental problems, sore throat,       +sneezing, itching, ear ache, +nasal congestion, post nasal drip, snoring CV:    chest pain, orthopnea, PND, +swelling in lower extremities, anasarca,                                   dizziness,  palpitations Resp:   shortness of breath with exertion or at rest.                productive cough,   non-productive cough, coughing up of blood.              change in color of mucus.  wheezing.   Skin:    rash or lesions. GI:  No-   heartburn, indigestion, abdominal pain, nausea, vomiting, diarrhea,                 change in bowel habits, loss of appetite GU: dysuria, change in color of urine, no urgency or frequency.   flank pain. MS:   +joint pain, stiffness, decreased range of motion, back pain. Neuro-     nothing unusual Psych:  change in mood or affect.  depression or anxiety.   memory loss.  OBJ- Physical Exam General- Alert, Oriented, Affect-appropriate, Distress- none acute, + obese Skin- rash-none, lesions- none, excoriation- none Lymphadenopathy- none Head- atraumatic            Eyes- Gross vision intact, PERRLA, conjunctivae and secretions clear            Ears- Hearing, canals-normal            Nose- Clear, no-Septal dev, mucus, polyps, erosion, perforation             Throat- Mallampati IV , mucosa clear , drainage- none, tonsils- atrophic Neck- flexible , trachea midline, no stridor , thyroid nl, carotid no bruit Chest - symmetrical excursion ,  unlabored           Heart/CV- RRR , no murmur , no gallop  , no rub, nl s1 s2                           - JVD- none , edema- none, stasis changes- none, varices- none           Lung- clear to P&A, wheeze- none, cough- none , dullness-none, rub- none           Chest wall-  Abd-  Br/ Gen/ Rectal- Not done, not indicated Extrem- cyanosis- none, clubbing, none, atrophy- none, strength- nl Neuro- grossly intact to observation

## 2023-10-13 ENCOUNTER — Encounter: Payer: Self-pay | Admitting: Internal Medicine

## 2023-10-13 ENCOUNTER — Ambulatory Visit: Payer: Medicare Other | Admitting: Internal Medicine

## 2023-10-13 VITALS — BP 124/78 | HR 82 | Temp 98.3°F | Ht 64.0 in | Wt 187.6 lb

## 2023-10-13 DIAGNOSIS — G40309 Generalized idiopathic epilepsy and epileptic syndromes, not intractable, without status epilepticus: Secondary | ICD-10-CM | POA: Diagnosis not present

## 2023-10-13 DIAGNOSIS — G4733 Obstructive sleep apnea (adult) (pediatric): Secondary | ICD-10-CM

## 2023-10-13 NOTE — Assessment & Plan Note (Signed)
Benefits from CPAP with good compliance and control. Due for replacement machine. Plan- [facility] to determine path for replacement to determine role of [facility] vs [facility]. Changing to auto 5-15.

## 2023-10-13 NOTE — Patient Instructions (Addendum)
Order- [facility]- CPAP care now is between [place]. Please order replacement for old CPAP to [facility]- auto 5-15, mask of choice, humidifier, supplies, AirView/ card (Split NPSG 10/23/22- AHI 42.8/hr, desat to 85%, body weight 180 lbs, CPAP to 11.)

## 2023-10-13 NOTE — Addendum Note (Signed)
Addended by: Legrand Pitts on: 10/13/2023 11:01 AM   Modules accepted: Orders

## 2023-10-13 NOTE — Assessment & Plan Note (Signed)
Followed by Neurology. They will determine driving restriction.

## 2023-10-16 NOTE — Progress Notes (Signed)
Carelink Summary Report / Loop Recorder 

## 2023-11-03 ENCOUNTER — Ambulatory Visit (INDEPENDENT_AMBULATORY_CARE_PROVIDER_SITE_OTHER): Payer: Medicare Other

## 2023-11-03 DIAGNOSIS — R55 Syncope and collapse: Secondary | ICD-10-CM | POA: Diagnosis not present

## 2023-11-03 LAB — CUP PACEART REMOTE DEVICE CHECK
Date Time Interrogation Session: 20241122230557
Implantable Pulse Generator Implant Date: 20231128

## 2023-11-10 ENCOUNTER — Ambulatory Visit: Payer: Medicare Other

## 2023-12-01 ENCOUNTER — Other Ambulatory Visit: Payer: Self-pay | Admitting: "Endocrinology

## 2023-12-01 NOTE — Progress Notes (Signed)
Carelink Summary Report / Loop Recorder 

## 2023-12-08 ENCOUNTER — Ambulatory Visit (INDEPENDENT_AMBULATORY_CARE_PROVIDER_SITE_OTHER): Payer: Medicare Other

## 2023-12-08 DIAGNOSIS — R55 Syncope and collapse: Secondary | ICD-10-CM | POA: Diagnosis not present

## 2023-12-08 LAB — CUP PACEART REMOTE DEVICE CHECK
Date Time Interrogation Session: 20241229231455
Implantable Pulse Generator Implant Date: 20231128

## 2023-12-15 ENCOUNTER — Ambulatory Visit: Payer: Medicare Other

## 2023-12-22 ENCOUNTER — Other Ambulatory Visit: Payer: Self-pay | Admitting: *Deleted

## 2023-12-22 ENCOUNTER — Encounter: Payer: Self-pay | Admitting: Diagnostic Neuroimaging

## 2023-12-22 ENCOUNTER — Encounter: Payer: Self-pay | Admitting: "Endocrinology

## 2023-12-22 ENCOUNTER — Encounter: Payer: Self-pay | Admitting: Internal Medicine

## 2023-12-22 ENCOUNTER — Encounter: Payer: Self-pay | Admitting: Family Medicine

## 2023-12-22 DIAGNOSIS — G40309 Generalized idiopathic epilepsy and epileptic syndromes, not intractable, without status epilepticus: Secondary | ICD-10-CM

## 2023-12-31 ENCOUNTER — Encounter: Payer: Self-pay | Admitting: Family Medicine

## 2024-01-06 DIAGNOSIS — H35033 Hypertensive retinopathy, bilateral: Secondary | ICD-10-CM | POA: Diagnosis not present

## 2024-01-06 DIAGNOSIS — H04123 Dry eye syndrome of bilateral lacrimal glands: Secondary | ICD-10-CM | POA: Diagnosis not present

## 2024-01-06 DIAGNOSIS — H401131 Primary open-angle glaucoma, bilateral, mild stage: Secondary | ICD-10-CM | POA: Diagnosis not present

## 2024-01-06 DIAGNOSIS — E119 Type 2 diabetes mellitus without complications: Secondary | ICD-10-CM | POA: Diagnosis not present

## 2024-01-09 ENCOUNTER — Encounter: Payer: Self-pay | Admitting: Nurse Practitioner

## 2024-01-09 ENCOUNTER — Ambulatory Visit (INDEPENDENT_AMBULATORY_CARE_PROVIDER_SITE_OTHER): Payer: PPO | Admitting: "Endocrinology

## 2024-01-09 ENCOUNTER — Encounter: Payer: Self-pay | Admitting: "Endocrinology

## 2024-01-09 VITALS — BP 134/66 | HR 76 | Ht 64.0 in | Wt 179.4 lb

## 2024-01-09 DIAGNOSIS — E782 Mixed hyperlipidemia: Secondary | ICD-10-CM

## 2024-01-09 DIAGNOSIS — Z6832 Body mass index (BMI) 32.0-32.9, adult: Secondary | ICD-10-CM | POA: Diagnosis not present

## 2024-01-09 DIAGNOSIS — I10 Essential (primary) hypertension: Secondary | ICD-10-CM

## 2024-01-09 DIAGNOSIS — E1122 Type 2 diabetes mellitus with diabetic chronic kidney disease: Secondary | ICD-10-CM | POA: Diagnosis not present

## 2024-01-09 DIAGNOSIS — E6609 Other obesity due to excess calories: Secondary | ICD-10-CM

## 2024-01-09 DIAGNOSIS — N1831 Chronic kidney disease, stage 3a: Secondary | ICD-10-CM

## 2024-01-09 DIAGNOSIS — Z794 Long term (current) use of insulin: Secondary | ICD-10-CM

## 2024-01-09 DIAGNOSIS — E66811 Obesity, class 1: Secondary | ICD-10-CM

## 2024-01-09 LAB — POCT GLYCOSYLATED HEMOGLOBIN (HGB A1C): HbA1c, POC (controlled diabetic range): 6.7 % (ref 0.0–7.0)

## 2024-01-09 MED ORDER — TIRZEPATIDE 7.5 MG/0.5ML ~~LOC~~ SOAJ: 7.5000 mg | SUBCUTANEOUS | 1 refills | Status: DC

## 2024-01-09 NOTE — Progress Notes (Signed)
01/09/2024, 10:22 AM  Endocrinology follow-up note   Subjective:    Patient ID: Jason Salazar, male    DOB: 04/17/1946.  Jason Salazar is being seen in consultation for management of currently uncontrolled symptomatic diabetes requested by  Fleet Contras, MD.   Past Medical History:  Diagnosis Date   Allergic rhinitis    Balantidiasis    Cancer [facility])    prostate cancer treated 2007 with radiation seed placement   Diabetes (HCC)    H/O prostate cancer    Radioactive seeds   Hypercholesteremia    Hyperlipidemia    Hyperlipoproteinemia    Hypertension    Sleep apnea     Past Surgical History:  Procedure Laterality Date   cyst removed from right kidney      Social History   Socioeconomic History   Marital status: Married    Spouse name: Jason Salazar   Number of children: 1   Years of education: HS   Highest education level: Not on file  Occupational History    Employer: [employer]  Tobacco Use   Smoking status: Never   Smokeless tobacco: Never  Vaping Use   Vaping status: Never Used  Substance and Sexual Activity   Alcohol use: No   Drug use: No   Sexual activity: Not on file  Other Topics Concern   Not on file  Social History Narrative   Patient is married to [place] and has one son.   Caffeine Use: none; quit a few months ago             Social Drivers of Corporate investment banker Strain: Not on file  Food Insecurity: Not on file  Transportation Needs: Not on file  Physical Activity: Not on file  Stress: Not on file  Social Connections: Unknown (04/22/2022)   Received from [facility], [facility]   Social Network    Social Network: Not on file    Family History  Problem Relation Age of Onset   CVA Mother    Sleep apnea Brother    Thyroid cancer Father    Prostate cancer Father     Outpatient Encounter Medications as of 01/09/2024  Medication  Sig   tirzepatide (MOUNJARO) 7.5 MG/0.5ML Pen Inject 7.5 mg into the skin once a week.   allopurinol (ZYLOPRIM) 100 MG tablet Take 100 mg by mouth 2 (two) times daily.   aspirin EC 81 MG tablet Take 81 mg by mouth daily.   b complex vitamins tablet Take 1 tablet by mouth daily.   bimatoprost (LUMIGAN) 0.01 % SOLN Instill 1 drop into both eyes at bedtime   Cholecalciferol (VITAMIN D3) 1000 units CAPS Take 1,000 Units by mouth 1 day or 1 dose.   Colchicine 0.6 MG CAPS Take 1 capsule by mouth 2 (two) times daily as needed for gout flare up   dapagliflozin propanediol (FARXIGA) 10 MG TABS tablet Take 1 tablet (10 mg total) by mouth daily.   DILANTIN 100 MG ER capsule Take 4 capsules (400 mg total) by mouth at bedtime.   furosemide (LASIX) 20 MG tablet Take 1 tablet (20 mg total) by mouth daily.   gabapentin (  NEURONTIN) 300 MG capsule Take 1 capsule (300 mg total) by mouth at bedtime. (Patient taking differently: Take 300 mg by mouth as needed (pain).)   Insulin Pen Needle 31G X 5 MM MISC Use with insulin at bedtime. E11.65   losartan (COZAAR) 25 MG tablet Take 25 mg by mouth daily.   mometasone (NASONEX) 50 MCG/ACT nasal spray 2 sprays as needed.   Multiple Vitamins-Minerals (ONE-A-DAY MENS 50+ ADVANTAGE PO) Take 1 tablet by mouth daily.   predniSONE (DELTASONE) 20 MG tablet Take by mouth as needed (pain).   rosuvastatin (CRESTOR) 10 MG tablet Take 1 tablet by mouth once daily.   spironolactone (ALDACTONE) 25 MG tablet Take 0.5 tablets (12.5 mg total) by mouth daily.   tiZANidine (ZANAFLEX) 4 MG tablet Take 4 mg by mouth 2 (two) times daily as needed.   Vitamin C 250 MG TABS Take 250 mg by mouth 1 day or 1 dose.   Zinc 22.5 MG TABS Take 22.5 mg by mouth 1 day or 1 dose.   [DISCONTINUED] insulin degludec (TRESIBA FLEXTOUCH) 100 UNIT/ML FlexTouch Pen Inject 40 Units into the skin at bedtime. (Patient taking differently: Inject 20 Units into the skin at bedtime.)   [DISCONTINUED] tirzepatide  (MOUNJARO) 5 MG/0.5ML Pen Inject 5 mg into the skin once a week.   No facility-administered encounter medications on file as of 01/09/2024.    ALLERGIES: Allergies  Allergen Reactions   Atorvastatin Other (See Comments)    Other reaction(s): lightheaded    VACCINATION STATUS: Immunization History  Administered Date(s) Administered   Fluad Quad(high Dose 65+) 09/18/2021   Influenza Split 09/08/2012, 09/08/2013   Influenza, High Dose Seasonal PF 09/02/2019   Influenza-Unspecified 09/08/2012, 08/09/2022   PFIZER(Purple Top)SARS-COV-2 Vaccination 01/22/2020, 02/13/2020   Pfizer Covid-19 Vaccine Bivalent Booster 71yrs & up 10/26/2021   Pfizer(Comirnaty)Fall Seasonal Vaccine 12 years and older 02/06/2023    Diabetes He presents for his follow-up diabetic visit. He has type 2 diabetes mellitus. His disease course has been improving. There are no hypoglycemic associated symptoms. Pertinent negatives for hypoglycemia include no confusion, headaches, pallor or seizures. Associated symptoms include polydipsia and polyuria. Pertinent negatives for diabetes include no chest pain, no fatigue, no polyphagia and no weakness. There are no hypoglycemic complications. Symptoms are improving. 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 (Obesity, hyperlipidemia, hypertension, sedentary life) Risk factors for coronary artery disease include diabetes mellitus, dyslipidemia, family history, hypertension, male sex, obesity and sedentary lifestyle. Current diabetic treatment includes insulin injections and oral agent (monotherapy). His weight is decreasing steadily (This patient has achieved 12 pounds of weight loss so far.). He is following a generally unhealthy diet. When asked about meal planning, he reported none. He has not had a previous visit with a dietitian. He rarely participates in exercise. His home blood glucose trend is decreasing steadily. His breakfast blood glucose range is generally 130-140  mg/dl. His lunch blood glucose range is generally 130-140 mg/dl. His dinner blood glucose range is generally 130-140 mg/dl. His bedtime blood glucose range is generally 130-140 mg/dl. His overall blood glucose range is 130-140 mg/dl. (He presents with continued improvement in his glycemic profile.  He presents with his CGM analyzed and AGP report shows 86% time in range, 30% level 1 hyperglycemia, no hypoglycemia.  His average blood glucose is 138 mg per DL.  His point-of-care A1c is 6.7% overall improving from 10%.    ) An ACE inhibitor/angiotensin II receptor blocker is being taken.  Hyperlipidemia This is a chronic problem. The current  episode started more than 1 year ago. The problem is controlled. Exacerbating diseases include chronic renal disease, diabetes and obesity. Pertinent negatives include no chest pain, myalgias or shortness of breath. Current antihyperlipidemic treatment includes statins. Risk factors for coronary artery disease include dyslipidemia, diabetes mellitus, hypertension, male sex, obesity, a sedentary lifestyle and family history.  Hypertension This is a chronic problem. The current episode started more than 1 year ago. The problem is controlled. Pertinent negatives include no chest pain, headaches, neck pain, palpitations or shortness of breath. Risk factors for coronary artery disease include dyslipidemia, obesity, male gender, diabetes mellitus and sedentary lifestyle. Past treatments include angiotensin blockers. Hypertensive end-organ damage includes kidney disease and CVA. Identifiable causes of hypertension include chronic renal disease.    Review of Systems  Constitutional:  Negative for chills, fatigue, fever and unexpected weight change.  HENT:  Negative for dental problem, mouth sores and trouble swallowing.   Eyes:  Negative for visual disturbance.  Respiratory:  Negative for cough, choking, chest tightness, shortness of breath and wheezing.   Cardiovascular:   Negative for chest pain, palpitations and leg swelling.  Gastrointestinal:  Negative for abdominal distention, abdominal pain, constipation, diarrhea, nausea and vomiting.  Endocrine: Positive for polydipsia and polyuria. Negative for polyphagia.  Genitourinary:  Negative for dysuria, flank pain, hematuria and urgency.  Musculoskeletal:  Negative for back pain, gait problem, myalgias and neck pain.  Skin:  Negative for pallor, rash and wound.  Neurological:  Negative for seizures, syncope, weakness, numbness and headaches.  Psychiatric/Behavioral:  Negative for confusion and dysphoric mood.     Objective:       01/09/2024    8:28 AM 10/13/2023   10:10 AM 09/24/2023    9:11 AM  Vitals with BMI  Height 5\' 4"  5\' 4"  5\' 4"   Weight 179 lbs 6 oz 187 lbs 10 oz 189 lbs  BMI 30.78 32.19 32.43  Systolic 134 124 621  Diastolic 66 78 60  Pulse 76 82 87    BP 134/66   Pulse 76   Ht 5\' 4"  (1.626 m)   Wt 179 lb 6.4 oz (81.4 kg)   BMI 30.79 kg/m   Wt Readings from Last 3 Encounters:  01/09/24 179 lb 6.4 oz (81.4 kg)  10/13/23 187 lb 9.6 oz (85.1 kg)  09/24/23 189 lb (85.7 kg)       CMP ( most recent) CMP     Component Value Date/Time   NA 142 10/07/2023 1018   K 4.5 10/07/2023 1018   CL 102 10/07/2023 1018   CO2 22 10/07/2023 1018   GLUCOSE 80 10/07/2023 1018   GLUCOSE 192 (H) 11/12/2022 1031   BUN 24 10/07/2023 1018   CREATININE 1.28 (H) 10/07/2023 1018   CREATININE 1.32 (H) 11/12/2022 1031   CALCIUM 9.0 10/07/2023 1018   PROT 6.6 09/03/2023 0824   ALBUMIN 4.2 09/03/2023 0824   AST 27 09/03/2023 0824   ALT 46 (H) 09/03/2023 0824   ALKPHOS 135 (H) 09/03/2023 0824   BILITOT 0.2 09/03/2023 0824   GFRNONAA 46 (L) 03/19/2021 0913   GFRAA 53 (L) 03/19/2021 0913     Lipid Panel ( most recent) Lipid Panel     Component Value Date/Time   CHOL 194 09/03/2023 0824   TRIG 204 (H) 09/03/2023 0824   HDL 56 09/03/2023 0824   CHOLHDL 3.5 09/03/2023 0824   CHOLHDL 3.2 03/19/2021  0913   LDLCALC 103 (H) 09/03/2023 0824   LDLCALC 83 03/19/2021 0913   LABVLDL  35 09/03/2023 0824      Lab Results  Component Value Date   TSH 2.490 09/03/2023   TSH 2.060 03/06/2023   TSH 1.530 10/09/2021   TSH 1.96 03/19/2021   FREET4 0.82 09/03/2023    Lipid Panel     Component Value Date/Time   CHOL 194 09/03/2023 0824   TRIG 204 (H) 09/03/2023 0824   HDL 56 09/03/2023 0824   CHOLHDL 3.5 09/03/2023 0824   CHOLHDL 3.2 03/19/2021 0913   LDLCALC 103 (H) 09/03/2023 0824   LDLCALC 83 03/19/2021 0913   LABVLDL 35 09/03/2023 0824     Assessment & Plan:   1. Type 2 diabetes mellitus with stage 3a chronic kidney disease, with long-term current use of insulin (HCC)   - Heriberto Stmartin has currently uncontrolled symptomatic type 2 DM since  78 years of age.  He presents with continued improvement in his glycemic profile.  He presents with his CGM analyzed and AGP report shows 86% time in range, 30% level 1 hyperglycemia, no hypoglycemia.  His average blood glucose is 138 mg per DL.  His point-of-care A1c is 6.7% overall improving from 10%.    Recent labs reviewed. - I had a long discussion with him about the possible risk factors and  the pathology behind its diabetes and its complications. -his diabetes is complicated by CVA, CKD stage 3a, obesity/sedentary life, comorbid hypertension, hyperlipidemia, and he remains at exceedingly high risk for more acute and chronic complications which include CAD, CVA, CKD, retinopathy, and neuropathy. These are all discussed in detail with him.  - I discussed all available options of managing his diabetes including de-escalation of medications. I have counseled him on Food as Medicine by adopting a Whole Food , Plant Predominant  ( WFPP) nutrition as recommended by [employer] of Lifestyle Medicine. Patient is encouraged to switch to  unprocessed or minimally processed  complex starch, adequate protein intake (mainly plant source), minimal  liquid fat, plenty of fruits, and vegetables. -  he is advised to stick to a routine mealtimes to eat 3 complete meals a day and snack only when necessary ( to snack only to correct hypoglycemia BG <70 day time or <100 at night).   - he acknowledges that there is a room for improvement in his food and drink choices. - Suggestion is made for him to avoid simple carbohydrates  from his diet including Cakes, Sweet Desserts, Ice Cream, Soda (diet and regular), Sweet Tea, Candies, Chips, Cookies, Store Bought Juices, Alcohol , Artificial Sweeteners,  Coffee Creamer, and "Sugar-free" Products, Lemonade. This will help patient to have more stable blood glucose profile and potentially avoid unintended weight gain.  The following Lifestyle Medicine recommendations according to American College of Lifestyle Medicine  [facility]) were discussed and and offered to patient and he  agrees to start the journey:  A. Whole Foods, Plant-Based Nutrition comprising of fruits and vegetables, plant-based proteins, whole-grain carbohydrates was discussed in detail with the patient.   A list for source of those nutrients were also provided to the patient.  Patient will use only water or unsweetened tea for hydration. B.  The need to stay away from risky substances including alcohol, smoking; obtaining 7 to 9 hours of restorative sleep, at least 150 minutes of moderate intensity exercise weekly, the importance of healthy social connections,  and stress management techniques were discussed. C.  A full color page of  Calorie density of various food groups per pound showing examples of each food  groups was provided to the patient.    - he will be scheduled with Norm Salt, RDN, CDE for individualized diabetes education.  - I have approached him with the following individualized plan to manage  his diabetes and patient agrees:  -In light of his significant improvement in his glycemic profile and to avoid risk of hypoglycemia  from inadvertent use of insulin, he is advised to discontinue his [place] at this time.    He continues to tolerate Mounjaro, discussed and increased his dose to 7.5 mg subcutaneously weekly.   He is encouraged to continue to use his CGM. - he is encouraged to call clinic for blood glucose levels less than 70 or above 200 mg /dl weekly average blood glucose. - he is benefiting from his SGLT2 inhibitors.  He is advised to continue Farxiga 10 mg p.o. daily at breakfast.   -If his next labs show resolution of the CKD, he will be reconsidered for low-dose metformin. -Targets for  A1c;  LDL, HDL,  and Triglycerides were discussed with the patient.  2) Blood Pressure /Hypertension:    His blood pressure is controlled to target.  he is advised to continue his current medications including clonidine 0.1 mg p.o. daily, losartan 25 mg p.o. daily with breakfast .  3) Lipids/Hyperlipidemia:   Review of his recent lipid panel showed improved LDL to 103 from 145.  He has engaged and advised to continue Crestor 10 mg p.o. nightly.    Side effects and precautions discussed with him.  4) obesity wit class 1-  Body mass index is 30.79 kg/m.  -He is currently losing weight, high BMI clearly complicating his diabetes care.   he is  a candidate for weight loss. I discussed with him the fact that loss of 5 - 10% of his  current body weight will have the most impact on his diabetes management.  The above detailed  ACLM recommendations for nutrition, exercise, sleep, social life, avoidance of risky substances, the need for restorative sleep   information will also detailed on discharge instructions.  5) Chronic Care/Health Maintenance:  -he  is on ACEI/ARB and Statin medications and  is encouraged to initiate and continue to follow up with Ophthalmology, Dentist,  Podiatrist at least yearly or according to recommendations, and advised to   stay away from smoking. I have recommended yearly flu vaccine and pneumonia  vaccine at least every 5 years; moderate intensity exercise for up to 150 minutes weekly; and  sleep for 7- 9 hours a day.  - he is  advised to maintain close follow up with Fleet Contras, MD for primary care needs, as well as his other providers for optimal and coordinated care.  I spent  26  minutes in the care of the patient today including review of labs from CMP, Lipids, Thyroid Function, Hematology (current and previous including abstractions from other facilities); face-to-face time discussing  his blood glucose readings/logs, discussing hypoglycemia and hyperglycemia episodes and symptoms, medications doses, his options of short and long term treatment based on the latest standards of care / guidelines;  discussion about incorporating lifestyle medicine;  and documenting the encounter. Risk reduction counseling performed per USPSTF guidelines to reduce  obesity and cardiovascular risk factors.     Please refer to Patient Instructions for Blood Glucose Monitoring and Insulin/Medications Dosing Guide"  in media tab for additional information. Please  also refer to " Patient Self Inventory" in the Media  tab for reviewed elements of pertinent patient history.  [name] participated in the discussions, expressed understanding, and voiced agreement with the above plans.  All questions were answered to his satisfaction. he is encouraged to contact clinic should he have any questions or concerns prior to his return visit.   Follow up plan: - Return in about 4 months (around 05/08/2024) for F/U with Pre-visit Labs, Meter/CGM/Logs, A1c here.  Marquis Lunch, MD [facility] [address] [place], [place] [address] Phone: [phone]  Fax: [phone]    01/09/2024, 10:22 AM  This note was partially dictated with voice recognition software. Similar sounding words can be transcribed inadequately or may not  be corrected upon review.

## 2024-01-09 NOTE — Patient Instructions (Signed)

## 2024-01-12 ENCOUNTER — Ambulatory Visit (INDEPENDENT_AMBULATORY_CARE_PROVIDER_SITE_OTHER): Payer: Medicare Other

## 2024-01-12 DIAGNOSIS — R55 Syncope and collapse: Secondary | ICD-10-CM

## 2024-01-12 LAB — CUP PACEART REMOTE DEVICE CHECK
Date Time Interrogation Session: 20250202231613
Implantable Pulse Generator Implant Date: 20231128

## 2024-01-13 ENCOUNTER — Encounter: Payer: Self-pay | Admitting: "Endocrinology

## 2024-01-15 ENCOUNTER — Telehealth: Payer: Self-pay

## 2024-01-15 NOTE — Telephone Encounter (Signed)
 Patient reports he is not able to describe symptoms but did press his symptom activator so we could see if anything aligned with how he was feeling. Patient advised finding AFL and needs to discuss if candidate for [facility]. Pt. Agreeable to AF clinic referral voiced understanding and agreeable to plan.

## 2024-01-15 NOTE — Telephone Encounter (Signed)
 One symptom was SR and the other appears to be atrial flutter.  Presenting rhythm is also atrial flutter, ? OAC. sent to triage.  Will call pt to assess and recommend AF clinic to discuss OAC.

## 2024-01-16 ENCOUNTER — Encounter (HOSPITAL_COMMUNITY): Payer: Self-pay

## 2024-01-17 ENCOUNTER — Encounter: Payer: Self-pay | Admitting: "Endocrinology

## 2024-01-19 ENCOUNTER — Ambulatory Visit: Payer: Medicare Other

## 2024-01-20 ENCOUNTER — Encounter: Payer: Self-pay | Admitting: Specialist

## 2024-01-20 DIAGNOSIS — N281 Cyst of kidney, acquired: Secondary | ICD-10-CM

## 2024-01-21 ENCOUNTER — Encounter (HOSPITAL_COMMUNITY): Payer: Self-pay | Admitting: Physician Assistant

## 2024-01-21 ENCOUNTER — Encounter: Payer: Self-pay | Admitting: Specialist

## 2024-01-21 ENCOUNTER — Encounter: Payer: Self-pay | Admitting: Family Medicine

## 2024-01-21 ENCOUNTER — Ambulatory Visit (HOSPITAL_COMMUNITY)
Admission: RE | Admit: 2024-01-21 | Discharge: 2024-01-21 | Disposition: A | Discharge status: Home / Self Care | Payer: Self-pay | Source: Ambulatory Visit | Attending: Physician Assistant | Admitting: Physician Assistant

## 2024-01-21 VITALS — BP 144/70 | HR 92 | Ht 64.0 in | Wt 181.2 lb

## 2024-01-21 DIAGNOSIS — E785 Hyperlipidemia, unspecified: Secondary | ICD-10-CM | POA: Insufficient documentation

## 2024-01-21 DIAGNOSIS — Z794 Long term (current) use of insulin: Secondary | ICD-10-CM | POA: Diagnosis not present

## 2024-01-21 DIAGNOSIS — I10 Essential (primary) hypertension: Secondary | ICD-10-CM | POA: Diagnosis not present

## 2024-01-21 DIAGNOSIS — Z7984 Long term (current) use of oral hypoglycemic drugs: Secondary | ICD-10-CM | POA: Insufficient documentation

## 2024-01-21 DIAGNOSIS — I484 Atypical atrial flutter: Secondary | ICD-10-CM | POA: Diagnosis not present

## 2024-01-21 DIAGNOSIS — Z8679 Personal history of other diseases of the circulatory system: Secondary | ICD-10-CM | POA: Diagnosis not present

## 2024-01-21 DIAGNOSIS — Z79899 Other long term (current) drug therapy: Secondary | ICD-10-CM | POA: Diagnosis not present

## 2024-01-21 DIAGNOSIS — Z8673 Personal history of transient ischemic attack (TIA), and cerebral infarction without residual deficits: Secondary | ICD-10-CM | POA: Diagnosis not present

## 2024-01-21 DIAGNOSIS — D6869 Other thrombophilia: Secondary | ICD-10-CM | POA: Diagnosis not present

## 2024-01-21 DIAGNOSIS — Z7901 Long term (current) use of anticoagulants: Secondary | ICD-10-CM | POA: Diagnosis not present

## 2024-01-21 DIAGNOSIS — Z7985 Long-term (current) use of injectable non-insulin antidiabetic drugs: Secondary | ICD-10-CM | POA: Insufficient documentation

## 2024-01-21 DIAGNOSIS — R569 Unspecified convulsions: Secondary | ICD-10-CM | POA: Insufficient documentation

## 2024-01-21 DIAGNOSIS — E119 Type 2 diabetes mellitus without complications: Secondary | ICD-10-CM | POA: Insufficient documentation
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
## 2024-01-25 ENCOUNTER — Encounter: Payer: Self-pay | Admitting: Cardiovascular Disease

## 2024-01-26 ENCOUNTER — Other Ambulatory Visit: Payer: Self-pay | Admitting: Urology

## 2024-01-26 DIAGNOSIS — N281 Cyst of kidney, acquired: Secondary | ICD-10-CM

## 2024-01-29 ENCOUNTER — Ambulatory Visit (HOSPITAL_COMMUNITY): Payer: Self-pay | Admitting: Physician Assistant

## 2024-02-04 ENCOUNTER — Encounter: Payer: Self-pay | Admitting: Internal Medicine

## 2024-02-04 ENCOUNTER — Other Ambulatory Visit: Payer: PPO

## 2024-02-04 ENCOUNTER — Telehealth: Payer: Self-pay | Admitting: Family Medicine

## 2024-02-04 ENCOUNTER — Telehealth: Payer: Self-pay

## 2024-02-04 ENCOUNTER — Ambulatory Visit: Payer: PPO | Attending: Internal Medicine | Admitting: Internal Medicine

## 2024-02-04 VITALS — BP 126/52 | HR 88 | Ht 64.0 in | Wt 177.6 lb

## 2024-02-04 DIAGNOSIS — I480 Paroxysmal atrial fibrillation: Secondary | ICD-10-CM | POA: Diagnosis not present

## 2024-02-04 DIAGNOSIS — E7800 Pure hypercholesterolemia, unspecified: Secondary | ICD-10-CM | POA: Diagnosis not present

## 2024-02-04 DIAGNOSIS — Z79899 Other long term (current) drug therapy: Secondary | ICD-10-CM

## 2024-02-04 DIAGNOSIS — R0789 Other chest pain: Secondary | ICD-10-CM | POA: Diagnosis not present

## 2024-02-04 DIAGNOSIS — G40309 Generalized idiopathic epilepsy and epileptic syndromes, not intractable, without status epilepticus: Secondary | ICD-10-CM

## 2024-02-04 DIAGNOSIS — R4020 Unspecified coma: Secondary | ICD-10-CM

## 2024-02-04 DIAGNOSIS — I10 Essential (primary) hypertension: Secondary | ICD-10-CM | POA: Diagnosis not present

## 2024-02-04 DIAGNOSIS — I441 Atrioventricular block, second degree: Secondary | ICD-10-CM

## 2024-02-04 NOTE — Telephone Encounter (Signed)
 Jason Salazar

## 2024-02-04 NOTE — Telephone Encounter (Signed)
 Pt's wife, Jason Salazar requesting  blood work due to having a new diagnosis of Atrial Fibrillation. Asking if patient can be seen earlier than October . Because of new diagnosis want to make sure needs stay on  DILANTIN 100 MG ER capsule. Would like a call back.  Informed Ms. [name] if neurologist treats patient for Afib would need a referral.

## 2024-02-04 NOTE — Patient Instructions (Signed)
 Medication Instructions:  Your physician recommends that you continue on your current medications as directed. Please refer to the Current Medication list given to you today.  *If you need a refill on your cardiac medications before your next appointment, please call your pharmacy*   Lab Work: NONE If you have labs (blood work) drawn today and your tests are completely normal, you will receive your results only by: MyChart Message (if you have MyChart) OR A paper copy in the mail If you have any lab test that is abnormal or we need to change your treatment, we will call you to review the results.   Testing/Procedures: Your physician has requested that you have en exercise stress myoview. For further information please visit [email or web address] Please follow instruction sheet, as given.    You are scheduled for a Myocardial Perfusion Imaging Study. Please arrive 15 minutes prior to your appointment time for registration and insurance purposes.   The test will take approximately 3 to 4 hours to complete; you may bring reading material.  If someone comes with you to your appointment, they will need to remain in the main lobby due to limited space in the testing area.    How to prepare for your Myocardial Perfusion Test: Do not eat or drink 3 hours prior to your test, except you may have water. Do not consume products containing caffeine (regular or decaffeinated) 12 hours prior to your test. (ex: coffee, chocolate, sodas, tea). Do bring a list of your current medications with you.  If not listed below, you may take your medications as normal. Do wear comfortable clothes (no dresses or overalls) and walking shoes, tennis shoes preferred (No heels or open toe shoes are allowed). Do NOT wear cologne, perfume, aftershave, or lotions (deodorant is allowed). If these instructions are not followed, your test will have to be rescheduled.  If you cannot keep your appointment, please provide 24  hours notification to the Nuclear Lab, to avoid a possible $50 charge to your account.     Your physician has referred you to see an Electrophysiologist.  Your physician has referred you to the Anticoagulation Clinic.   Follow-Up: At [facility], you and your health needs are our priority.  As part of our continuing mission to provide you with exceptional heart care, we have created designated Provider Care Teams.  These Care Teams include your primary Cardiologist (physician) and Advanced Practice Providers (APPs -  Physician Assistants and Nurse Practitioners) who all work together to provide you with the care you need, when you need it.   Your next appointment:   4 month(s)  Provider:   Jari Favre, PA-C, Ronie Spies, PA-C, Robin Searing, NP, Jacolyn Reedy, PA-C, Eligha Bridegroom, NP, Tereso Newcomer, PA-C, or Perlie Gold, PA-C

## 2024-02-04 NOTE — Telephone Encounter (Addendum)
 Received anticoagulation enrollment notification from Dr Izora Ribas. Per office visit note:  "Start Coumadin as a bridge to [employer] procedure  Coordination with the neurologist is necessary if considering a change in medication to facilitate anticoagulation therapy."    I called and spoke with pt and pt's wife over the phone. Provided education on Warfarin and how it  requires frequent INR monitoring. They both verbalized understanding and stated they are currently working closely with pt's neurologist to see if he can be switched to Keppra so pt can be on other anticoagulant such as Eliquis. I made them aware since Dr Ronette Deter is planning for [facility] procedure, pt will need to make [place] aware ASAP what they decide. I provided them with the direct number to coumadin clinic [phone]).  Pt states he will call as soon as they hear back from neurology office.

## 2024-02-04 NOTE — Telephone Encounter (Signed)
 Pt's wife said could not bring patient today. Can bring him tomorrow at 4 pm.

## 2024-02-04 NOTE — Progress Notes (Signed)
 Cardiology Office Note:    Date:  02/04/2024   ID:  Aleksey Newbern, DOB 06-10-1946, MRN [id number]  PCP:  Fleet Contras, MD  Cardiologist:  Christell Constant, MD   Referring MD: Fleet Contras, MD   CC: [facility] discussion  History of Present Illness:    Discussed the use of AI scribe software for clinical note transcription with the patient, who gave verbal consent to proceed.  Javante Nilsson is a 78 y.o. 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  2023: had syncope.  S/p ILR.  Dr. Katrinka Blazing had wanted to do testing for cardiac sarcoidosis. 2024: Testing negative for infiltrative disease .   Mr. [name], with paroxysmal atrial fibrillation, hypertension, and hyperlipidemia, presents with chest discomfort and atrial flutter. They were referred by Dr. Katrinka Blazing for evaluation of cardiac amyloidosis and syncope.  They have a history of paroxysmal atrial fibrillation and are currently being monitored with an implantable loop recorder. They have experienced episodes of atrial flutter and were previously in atrial fibrillation, but their rhythm has returned to normal. They have a high CHAD-VASc score and are considering anticoagulation therapy as a bridge to the Watchman procedure.  They have experienced chest discomfort, described as a 'little pressure' in their chest, occurring on two occasions this year, including yesterday morning and once while shopping. The discomfort is not clearly associated with exertion. No breathing issues are reported.  They have a history of seizures, with the last episode occurring a long time ago. They are currently on Dilantin for seizure management, and there is a discussion about the potential impact of their seizure medications on anticoagulation options.  They have a history of obstructive sleep apnea and  use a CPAP machine at night. They recently received a new CPAP mask after issues with a previous one that caused discomfort. They report occasional feeling of lightheadedness and chest pressure, which they attribute to their CPAP machine.  They have a history of hypertension, which is currently well-controlled, and hyperlipidemia. They have had elevated blood sugars in the past. Their kidney function was noted to be slightly elevated last year with a creatinine level of 1.28.   Past Medical History:  Diagnosis Date   Allergic rhinitis    Balantidiasis    Cancer [facility])    prostate cancer treated 2007 with radiation seed placement   Diabetes (HCC)    H/O prostate cancer    Radioactive seeds   Hypercholesteremia    Hyperlipidemia    Hyperlipoproteinemia    Hypertension    Sleep apnea     Past Surgical History:  Procedure Laterality Date   cyst removed from right kidney      Current Medications: Current Meds  Medication Sig   allopurinol (ZYLOPRIM) 100 MG tablet Take 100 mg by mouth 2 (two) times daily.   aspirin EC 81 MG tablet Take 81 mg by mouth daily.   b complex vitamins tablet Take 1 tablet by mouth daily.   bimatoprost (LUMIGAN) 0.01 % SOLN Instill 1 drop into both eyes at bedtime   Cholecalciferol (VITAMIN D3) 1000 units CAPS Take 1,000 Units by mouth 1 day or 1 dose.   Colchicine 0.6 MG CAPS Take 1 capsule by mouth 2 (two) times daily as needed for gout flare up   Continuous Glucose Sensor (FREESTYLE LIBRE 3 PLUS SENSOR) MISC FOR CGM GLUCOSE TESTING., DX: E11.65 ON INSULIN  dapagliflozin propanediol (FARXIGA) 10 MG TABS tablet Take 1 tablet (10 mg total) by mouth daily.   DILANTIN 100 MG ER capsule Take 4 capsules (400 mg total) by mouth at bedtime.   furosemide (LASIX) 20 MG tablet Take 1 tablet (20 mg total) by mouth daily.   gabapentin (NEURONTIN) 300 MG capsule Take 1 capsule (300 mg total) by mouth at bedtime. (Patient taking differently: Take 300 mg by mouth as  needed (pain).)   losartan (COZAAR) 25 MG tablet Take 25 mg by mouth daily.   mometasone (NASONEX) 50 MCG/ACT nasal spray 2 sprays as needed.   Multiple Vitamins-Minerals (ONE-A-DAY MENS 50+ ADVANTAGE PO) Take 1 tablet by mouth daily.   rosuvastatin (CRESTOR) 10 MG tablet Take 1 tablet by mouth once daily.   spironolactone (ALDACTONE) 25 MG tablet Take 0.5 tablets (12.5 mg total) by mouth daily.   tirzepatide (MOUNJARO) 7.5 MG/0.5ML Pen Inject 7.5 mg into the skin once a week.   tiZANidine (ZANAFLEX) 4 MG tablet Take 4 mg by mouth 2 (two) times daily as needed.   Vitamin C 250 MG TABS Take 250 mg by mouth 1 day or 1 dose.   [DISCONTINUED] Insulin Pen Needle 31G X 5 MM MISC Use with insulin at bedtime. E11.65   [DISCONTINUED] predniSONE (DELTASONE) 20 MG tablet Take by mouth as needed (pain).   [DISCONTINUED] Zinc 22.5 MG TABS Take 22.5 mg by mouth 1 day or 1 dose.     Allergies:   Atorvastatin   Social History   Socioeconomic History   Marital status: Married    Spouse name: Lanora Manis   Number of children: 1   Years of education: HS   Highest education level: Not on file  Occupational History    Employer: [employer]  Tobacco Use   Smoking status: Never   Smokeless tobacco: Never   Tobacco comments:    Never smoked 01/21/24  Vaping Use   Vaping status: Never Used  Substance and Sexual Activity   Alcohol use: No   Drug use: No   Sexual activity: Not on file  Other Topics Concern   Not on file  Social History Narrative   Patient is married to Wilson and has one son.   Caffeine Use: none; quit a few months ago             Social Drivers of Corporate investment banker Strain: Not on file  Food Insecurity: Not on file  Transportation Needs: Not on file  Physical Activity: Not on file  Stress: Not on file  Social Connections: Unknown (04/22/2022)   [facility]   Social Network    Social Network: Not on file     Family  History: The patient's family history includes CVA in his mother; Prostate cancer in his father; Sleep apnea in his brother; Thyroid cancer in his father.  ROS:   Please see the history of present illness.     EKGs/Labs/Other Studies Reviewed:     Cardiac Studies & Procedures   ______________________________________________________________________________________________     ECHOCARDIOGRAM  ECHOCARDIOGRAM COMPLETE 12/12/2022  Narrative ECHOCARDIOGRAM REPORT    Patient Name:   ANGELINA NEECE Date of Exam: 12/12/2022 Medical Rec #:  [id number]       Height:       64.0 in Accession #:    [id number]      Weight:       184.0 lb Date of Birth:  01-20-1946      BSA:  1.888 m Patient Age:    76 years        BP:           156/90 mmHg Patient Gender: M               HR:           76 bpm. Exam Location:  [address]  Procedure: 2D Echo, 3D Echo, Cardiac Doppler and Color Doppler  Indications:    R01.1 Murmur  History:        Patient has prior history of Echocardiogram examinations, most recent 08/23/2020. Arrythmias:AV Block, Mobitz 1, Signs/Symptoms:Syncope; Risk Factors:Hypertension, Sleep Apnea, HLD, CKD and Diabetes.  Sonographer:    Clearence Ped RCS Referring Phys: [phone] Barry Dienes [facility]  IMPRESSIONS   1. Left ventricular ejection fraction, by estimation, is 60 to 65%. The left ventricle has normal function. The left ventricle has no regional wall motion abnormalities. There is mild concentric left ventricular hypertrophy. Left ventricular diastolic parameters are consistent with Grade I diastolic dysfunction (impaired relaxation). 2. Right ventricular systolic function is normal. The right ventricular size is normal. Tricuspid regurgitation signal is inadequate for assessing PA pressure. 3. No evidence of mitral valve regurgitation. 4. Aortic valve regurgitation is not visualized. Aortic valve sclerosis is present, with no evidence of aortic valve stenosis. 5. The  inferior vena cava is normal in size with greater than 50% respiratory variability, suggesting right atrial pressure of 3 mmHg.  Comparison(s): No significant change from prior study.  FINDINGS Left Ventricle: Left ventricular ejection fraction, by estimation, is 60 to 65%. The left ventricle has normal function. The left ventricle has no regional wall motion abnormalities. The left ventricular internal cavity size was normal in size. There is mild concentric left ventricular hypertrophy. Left ventricular diastolic parameters are consistent with Grade I diastolic dysfunction (impaired relaxation).  Right Ventricle: The right ventricular size is normal. Right ventricular systolic function is normal. Tricuspid regurgitation signal is inadequate for assessing PA pressure.  Left Atrium: Left atrial size was normal in size.  Right Atrium: Right atrial size was normal in size.  Pericardium: There is no evidence of pericardial effusion.  Mitral Valve: No evidence of mitral valve regurgitation.  Tricuspid Valve: Tricuspid valve regurgitation is not demonstrated.  Aortic Valve: Aortic valve regurgitation is not visualized. Aortic valve sclerosis is present, with no evidence of aortic valve stenosis.  Pulmonic Valve: Pulmonic valve regurgitation is not visualized.  Venous: The inferior vena cava is normal in size with greater than 50% respiratory variability, suggesting right atrial pressure of 3 mmHg.  IAS/Shunts: No atrial level shunt detected by color flow Doppler.   LEFT VENTRICLE PLAX 2D LVIDd:         4.20 cm   Diastology LVIDs:         2.60 cm   LV e' medial:    8.38 cm/s LV PW:         1.00 cm   LV E/e' medial:  10.8 LV IVS:        1.00 cm   LV e' lateral:   6.20 cm/s LVOT diam:     1.80 cm   LV E/e' lateral: 14.6 LV SV:         41 LV SV Index:   22 LVOT Area:     2.54 cm  3D Volume EF: 3D EF:        55 % LV EDV:       89 ml LV ESV:  40 ml LV SV:        49 ml  RIGHT  VENTRICLE RV Basal diam:  3.10 cm RV S prime:     15.40 cm/s TAPSE (M-mode): 1.9 cm  LEFT ATRIUM             Index        RIGHT ATRIUM          Index LA diam:        3.30 cm 1.75 cm/m   RA Area:     8.62 cm LA Vol (A2C):   58.5 ml 30.98 ml/m  RA Volume:   14.30 ml 7.57 ml/m LA Vol (A4C):   44.1 ml 23.35 ml/m LA Biplane Vol: 51.2 ml 27.11 ml/m AORTIC VALVE LVOT Vmax:   86.20 cm/s LVOT Vmean:  60.500 cm/s LVOT VTI:    0.163 m  AORTA Ao Root diam: 3.10 cm Ao Asc diam:  3.00 cm  MITRAL VALVE MV Area (PHT):             SHUNTS MV Decel Time:             Systemic VTI:  0.16 m MV E velocity: 90.70 cm/s  Systemic Diam: 1.80 cm MV A velocity: 98.00 cm/s MV E/A ratio:  0.93  Photographer signed by Carolan Clines Signature Date/Time: 12/12/2022/9:54:11 AM    Final    MONITORS  CARDIAC EVENT MONITOR 09/27/2020  Narrative  Normal sinus rhythm with occasional 1st degree AV block  Occasional Mobitz 1 Second degree AV block during sleep  No arrhythmia noted with complaints.     CARDIAC MRI  MR CARDIAC MORPHOLOGY W WO CONTRAST 06/09/2023  Narrative CLINICAL DATA:  Clinical question of cardiac sarcoidosis Study assumes HCT of 46 and BSA of 1.98 m2.  EXAM: CARDIAC MRI  TECHNIQUE: The patient was scanned on a 1.5 Tesla GE magnet. A dedicated cardiac coil was used. Functional imaging was done using Fiesta sequences. 2,3, and 4 chamber views were done to assess for RWMA's. Modified Simpson's rule using a short axis stack was used to calculate an ejection fraction on a dedicated work [employer] officer, [employer]. The patient received 10 cc of Gadavist. After 10 minutes inversion recovery sequences were used to assess for infiltration and scar tissue. Flow quantification was performed 2 times during this examination with flow quantification performed at the levels of the ascending aorta above the valve, pulmonary artery above the valve.  CONTRAST:  10 cc   of Gadavist  FINDINGS: 1. Normal left ventricular size, with LVEDVi 54 mL/m2.  Mild concentric hypertrophy 13 mm, with myocardial mass index of 74 g/m2.  Normal left ventricular systolic function (LVEF =60%). There are no regional wall motion abnormalities.  Left ventricular parametric mapping notable for normal T2 and ECV signals.  There is no late gadolinium enhancement in the left ventricular myocardium.  2. Normal right ventricular size with RVEDVI 54 mL/m2.  Normal right ventricular thickness.  Normal right ventricular systolic function (RVEF =62%). There are no regional wall motion abnormalities or aneurysms.  3.  Normal left and right atrial size.  4. Normal size of the aortic root, ascending aorta and pulmonary artery.  5. Valve assessment:  Aortic Valve: Mild thickening. Cannot exclude forme fruste bicuspid valve in this series. No significant regurgitation or stenosis.  Pulmonic Valve: No significant regurgitation.  Tricuspid Valve: Morphology not well visualized. Mild regurgitation.  Mitral Valve: No stenosis.  No significant regurgitation.  6.  Normal pericardium.  No  pericardial effusion.  7. Grossly, no extracardiac findings. Recommended dedicated study if concerned for non-cardiac pathology.  IMPRESSION: LVEF 60%.  No evidence of cardiac sarcoidosis.  Riley Lam MD   Electronically Signed By: Riley Lam M.D. On: 06/09/2023 21:22   ______________________________________________________________________________________________       Recent Labs: 09/03/2023: ALT 46; TSH 2.490 10/07/2023: BUN 24; Creatinine, Ser 1.28; NT-Pro BNP 84; Potassium 4.5; Sodium 142  Recent Lipid Panel    Component Value Date/Time   CHOL 194 09/03/2023 0824   TRIG 204 (H) 09/03/2023 0824   HDL 56 09/03/2023 0824   CHOLHDL 3.5 09/03/2023 0824   CHOLHDL 3.2 03/19/2021 0913   LDLCALC 103 (H) 09/03/2023 0824   LDLCALC 83 03/19/2021 0913     Physical Exam:    VS:  BP (!) 126/52 (BP Location: Right Arm)   Pulse 88   Ht 5\' 4"  (1.626 m)   Wt 177 lb 9.6 oz (80.6 kg)   SpO2 98%   BMI 30.48 kg/m     Wt Readings from Last 3 Encounters:  02/04/24 177 lb 9.6 oz (80.6 kg)  01/21/24 181 lb 3.2 oz (82.2 kg)  01/09/24 179 lb 6.4 oz (81.4 kg)    GEN: No acute distress CARDIAC: Soft systolic murmur, RRR, distant heart sounds VASCULAR:  Normal Pulses.  RESPIRATORY:  Clear to auscultation without rales, wheezing or rhonchi  ABDOMEN: Soft, non-tenderly distended MUSCULOSKELETAL: No deformity; edema has resolved SKIN: Warm  NEUROLOGIC:  Alert and oriented x 3 PSYCHIATRIC:  Normal affect   ASSESSMENT:    1. PAF (paroxysmal atrial fibrillation) (HCC)   2. Chest pressure   3. Essential hypertension, benign   4. Hypercholesteremia      PLAN:    Atrial Flutter Atrial flutter with a high CHADS-VASc score, indicating a high risk of stroke. Currently in normal sinus rhythm with an active implantable loop recorder (ILR) for monitoring. Anticoagulation is necessary to reduce stroke risk. Options include Coumadin with its complex drug management and bleeding risks, or a Watchman procedure, which involves left atrial appendage closure to potentially eliminate the need for long-term anticoagulation. The Watchman procedure is considered reasonable given the high stroke risk and complex drug management. - Start Coumadin as a bridge to [employer] procedure - Refer to Coumadin clinic for anticoagulation management - Refer to EP for consideration of Watchman procedure - Perform exercise and nuclear medicine stress test - If unable to perform exercise stress test, consider pharmacological stress test - Monitor ILR for any episodes of atrial flutter  Chest pressure - given the atypical symptoms but reasonable control (with negative LHC at [facility] in the 90s) we discussed exercise NM Stress test with conversion to Lexiscan if unbale to do  activity  Hypertension Hypertension, currently well-controlled. Risk factor for coronary artery disease.  Hyperlipidemia Hyperlipidemia, a risk factor for coronary artery disease. Cholesterol levels are slightly elevated  Diabetes Mellitus Elevated blood sugars in the past, indicating diabetes mellitus. This condition contributes to the risk profile for coronary artery disease.  Seizure Disorder Seizure disorder, currently on Dilantin. Changing seizure medications is not recommended due to the risk of severe seizures. Coordination with the neurologist is necessary if considering a change in medication to facilitate anticoagulation therapy.  Obstructive Sleep Apnea Obstructive sleep apnea, potentially contributing to atrial flutter episodes. Recent improvement in symptoms with a new CPAP mask.  Follow up with my team in 3-4 months (resolution of CP)   Medication Adjustments/Labs and Tests Ordered: Current medicines are reviewed  at length with the patient today.  Concerns regarding medicines are outlined above.  Orders Placed This Encounter  Procedures   Ambulatory referral to Anticoagulation Monitoring   Ambulatory referral to Cardiac Electrophysiology   MYOCARDIAL PERFUSION IMAGING   No orders of the defined types were placed in this encounter.   Patient Instructions  Medication Instructions:  Your physician recommends that you continue on your current medications as directed. Please refer to the Current Medication list given to you today.  *If you need a refill on your cardiac medications before your next appointment, please call your pharmacy*   Lab Work: NONE If you have labs (blood work) drawn today and your tests are completely normal, you will receive your results only by: MyChart Message (if you have MyChart) OR A paper copy in the mail If you have any lab test that is abnormal or we need to change your treatment, we will call you to review the  results.   Testing/Procedures: Your physician has requested that you have en exercise stress myoview. For further information please visit [email or web address] Please follow instruction sheet, as given.    You are scheduled for a Myocardial Perfusion Imaging Study. Please arrive 15 minutes prior to your appointment time for registration and insurance purposes.   The test will take approximately 3 to 4 hours to complete; you may bring reading material.  If someone comes with you to your appointment, they will need to remain in the main lobby due to limited space in the testing area.    How to prepare for your Myocardial Perfusion Test: Do not eat or drink 3 hours prior to your test, except you may have water. Do not consume products containing caffeine (regular or decaffeinated) 12 hours prior to your test. (ex: coffee, chocolate, sodas, tea). Do bring a list of your current medications with you.  If not listed below, you may take your medications as normal. Do wear comfortable clothes (no dresses or overalls) and walking shoes, tennis shoes preferred (No heels or open toe shoes are allowed). Do NOT wear cologne, perfume, aftershave, or lotions (deodorant is allowed). If these instructions are not followed, your test will have to be rescheduled.  If you cannot keep your appointment, please provide 24 hours notification to the Nuclear Lab, to avoid a possible $50 charge to your account.     Your physician has referred you to see an Electrophysiologist.  Your physician has referred you to the Anticoagulation Clinic.   Follow-Up: At [facility], you and your health needs are our priority.  As part of our continuing mission to provide you with exceptional heart care, we have created designated Provider Care Teams.  These Care Teams include your primary Cardiologist (physician) and Advanced Practice Providers (APPs -  Physician Assistants and Nurse Practitioners) who all work together  to provide you with the care you need, when you need it.   Your next appointment:   4 month(s)  Provider:   Jari Favre, PA-C, Ronie Spies, PA-C, Robin Searing, NP, Jacolyn Reedy, PA-C, Eligha Bridegroom, NP, Tereso Newcomer, PA-C, or Perlie Gold, PA-C              Signed, Christell Constant, MD  02/04/2024 10:17 AM     [facility]

## 2024-02-04 NOTE — Telephone Encounter (Signed)
 Called back at [phone]. He will come today at 4:15pm for trough level of phenytoin. I placed on lab schedule. AL,NP informed and in agreement.

## 2024-02-05 ENCOUNTER — Other Ambulatory Visit (INDEPENDENT_AMBULATORY_CARE_PROVIDER_SITE_OTHER): Payer: Self-pay

## 2024-02-05 DIAGNOSIS — R4020 Unspecified coma: Secondary | ICD-10-CM | POA: Diagnosis not present

## 2024-02-05 DIAGNOSIS — G40309 Generalized idiopathic epilepsy and epileptic syndromes, not intractable, without status epilepticus: Secondary | ICD-10-CM

## 2024-02-05 DIAGNOSIS — Z79899 Other long term (current) drug therapy: Secondary | ICD-10-CM

## 2024-02-05 DIAGNOSIS — I441 Atrioventricular block, second degree: Secondary | ICD-10-CM | POA: Diagnosis not present

## 2024-02-05 DIAGNOSIS — Z0289 Encounter for other administrative examinations: Secondary | ICD-10-CM

## 2024-02-05 NOTE — Addendum Note (Signed)
 Addended by: Judi Cong on: 02/05/2024 03:44 PM   Modules accepted: Orders

## 2024-02-06 ENCOUNTER — Encounter (HOSPITAL_COMMUNITY): Payer: Self-pay

## 2024-02-06 ENCOUNTER — Telehealth (HOSPITAL_COMMUNITY): Payer: Self-pay | Admitting: *Deleted

## 2024-02-06 LAB — PHENYTOIN LEVEL, TOTAL: Phenytoin (Dilantin), Serum: 30 ug/mL (ref 10.0–20.0)

## 2024-02-06 NOTE — Telephone Encounter (Signed)
 Patient given detailed instructions per Myocardial Perfusion Study Information Sheet for the test on 02/11/2024 at 7:45. Patient notified to arrive 15 minutes early and that it is imperative to arrive on time for appointment to keep from having the test rescheduled.  If you need to cancel or reschedule your appointment, please call the office within 24 hours of your appointment. . Patient verbalized understanding.Jason Salazar

## 2024-02-10 ENCOUNTER — Encounter: Payer: Self-pay | Admitting: Family Medicine

## 2024-02-11 ENCOUNTER — Telehealth: Payer: Self-pay

## 2024-02-11 ENCOUNTER — Ambulatory Visit (HOSPITAL_COMMUNITY): Payer: PPO | Attending: Internal Medicine

## 2024-02-11 ENCOUNTER — Other Ambulatory Visit (HOSPITAL_COMMUNITY): Payer: Self-pay

## 2024-02-11 ENCOUNTER — Encounter: Payer: Self-pay | Admitting: Internal Medicine

## 2024-02-11 DIAGNOSIS — I480 Paroxysmal atrial fibrillation: Secondary | ICD-10-CM | POA: Insufficient documentation

## 2024-02-11 DIAGNOSIS — R0789 Other chest pain: Secondary | ICD-10-CM | POA: Insufficient documentation

## 2024-02-11 DIAGNOSIS — E7800 Pure hypercholesterolemia, unspecified: Secondary | ICD-10-CM | POA: Diagnosis not present

## 2024-02-11 DIAGNOSIS — I10 Essential (primary) hypertension: Secondary | ICD-10-CM | POA: Insufficient documentation

## 2024-02-11 LAB — MYOCARDIAL PERFUSION IMAGING
Angina Index: 0
Duke Treadmill Score: 0
Estimated workload: 7
Exercise duration (min): 5 min
Exercise duration (sec): 0 s
LV dias vol: 70 mL (ref 62–150)
LV sys vol: 43 mL
MPHR: 144 {beats}/min
Nuc Stress EF: 39 %
Peak HR: 148 {beats}/min
Percent HR: 102 %
Rest HR: 71 {beats}/min
Rest Nuclear Isotope Dose: 10.9 mCi
SDS: 0
SRS: 0
SSS: 0
ST Elevation (mm): 1 mm
Stress Nuclear Isotope Dose: 31.4 mCi
TID: 0.91

## 2024-02-11 MED ORDER — TECHNETIUM TC 99M TETROFOSMIN IV KIT
10.9000 | PACK | Freq: Once | INTRAVENOUS | Status: AC | PRN
  Administered 2024-02-11: 10.9 via INTRAVENOUS

## 2024-02-11 MED ORDER — TECHNETIUM TC 99M TETROFOSMIN IV KIT
31.4000 | PACK | Freq: Once | INTRAVENOUS | Status: AC | PRN
  Administered 2024-02-11: 31.4 via INTRAVENOUS

## 2024-02-11 NOTE — Telephone Encounter (Signed)
 Pharmacy Patient Advocate Encounter   Received notification from CoverMyMeds that prior authorization for [employer] [facility] [employer] is required/requested.   Insurance verification completed.   The patient is insured through [facility] ADVANTAGE/RX ADVANCE .   Per test claim: PA required; PA submitted to above mentioned insurance via CoverMyMeds Key/confirmation #/EOC [id number] Status is pending

## 2024-02-13 NOTE — Telephone Encounter (Signed)
 Pharmacy Patient Advocate Encounter  Received notification from [facility] ADVANTAGE/RX ADVANCE that Prior Authorization for Mounjaro 7.5 mg/0.5 mL subcutaneous pen injector has been APPROVED from 02-11-2024 to 02-10-2025   PA #/Case ID/Reference #: [id number]

## 2024-02-16 ENCOUNTER — Ambulatory Visit (INDEPENDENT_AMBULATORY_CARE_PROVIDER_SITE_OTHER): Payer: Medicare Other

## 2024-02-16 DIAGNOSIS — R55 Syncope and collapse: Secondary | ICD-10-CM | POA: Diagnosis not present

## 2024-02-16 LAB — CUP PACEART REMOTE DEVICE CHECK
Date Time Interrogation Session: 20250309231618
Implantable Pulse Generator Implant Date: 20231128
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
## 2024-02-16 NOTE — Telephone Encounter (Signed)
 Pt made aware

## 2024-02-16 NOTE — Telephone Encounter (Signed)
 Called and spoke w/ pt. He accepted appt tomorrow with Amy at 830a. Scheduled and asked he check in 8/815am.

## 2024-02-16 NOTE — Patient Instructions (Incomplete)
 Below is our plan:  We will start Vimpat 50mg  twice daily and reduce Dilantin to 300mg  at bedtime for 1 month. Then increase Vimpat to 100mg  twice daily and decrease Dilantin to 200mg  for 1 month, Then increase Vimpat to 150mg  twice daily and decrease Dilantin to 100mg  for 1 month. Then discontinue Dilantin and continue Vimpat 150mg  twice daily only.   Please return to the office in 60 days (2 months) for labs to evaluation dilantin level.   I advise no driving for 6 months while adjusting to new antiseizure management plan.   Please make sure you are consistent with timing of seizure medication. I recommend annual visit with primary care provider (PCP) for complete physical and routine blood work. I recommend daily intake of vitamin D (400-800iu) and calcium (800-1000mg ) for bone health. Discuss Dexa screening with PCP.  Please maintain precautions. Do not participate in activities where a loss of awareness could harm you or someone else. No swimming alone, no tub bathing, no hot tubs, no driving, no operating motorized vehicles (cars, ATVs, motocycles, etc), lawnmowers, power tools or firearms. No standing at heights, such as rooftops, ladders or stairs. Avoid hot objects such as stoves, heaters, open fires. Wear a helmet when riding a bicycle, scooter, skateboard, etc. and avoid areas of traffic. Set your water heater to 120 degrees or less.  SUDEP is the sudden, unexpected death of someone with epilepsy, who was otherwise healthy. In SUDEP cases, no other cause of death is found when an autopsy is done. Each year, more than 1 in 1,000 people with epilepsy die from SUDEP. This is the leading cause of death in people with uncontrolled seizures. Until further answers are available, the best way to prevent SUDEP is to lower your risk by controlling seizures. Research has found that people with all types of epilepsy that experience convulsive seizures can be at risk.   Please make sure you are staying  well hydrated. I recommend 50-60 ounces daily. Well balanced diet and regular exercise encouraged. Consistent sleep schedule with 6-8 hours recommended.   Please continue follow up with care team as directed.   Follow up with me in 4 months   You may receive a survey regarding today's visit. I encourage you to leave honest feed back as I do use this information to improve patient care. Thank you for seeing me today!

## 2024-02-17 ENCOUNTER — Ambulatory Visit: Admitting: Family Medicine

## 2024-02-17 ENCOUNTER — Encounter: Payer: Self-pay | Admitting: Family Medicine

## 2024-02-17 VITALS — BP 140/78 | HR 85 | Ht 64.0 in | Wt 173.0 lb

## 2024-02-17 DIAGNOSIS — I480 Paroxysmal atrial fibrillation: Secondary | ICD-10-CM | POA: Diagnosis not present

## 2024-02-17 DIAGNOSIS — Z79899 Other long term (current) drug therapy: Secondary | ICD-10-CM | POA: Diagnosis not present

## 2024-02-17 DIAGNOSIS — G40309 Generalized idiopathic epilepsy and epileptic syndromes, not intractable, without status epilepticus: Secondary | ICD-10-CM | POA: Diagnosis not present

## 2024-02-17 MED ORDER — LACOSAMIDE 50 MG PO TABS: 50.0000 mg | ORAL_TABLET | Freq: Two times a day (BID) | ORAL | 0 refills | Status: DC

## 2024-02-17 MED ORDER — DILANTIN 100 MG PO CAPS: 300.0000 mg | ORAL_CAPSULE | Freq: Every day | ORAL | 0 refills | Status: DC

## 2024-02-19 NOTE — Progress Notes (Signed)
 Carelink Summary Report / Loop Recorder

## 2024-02-24 ENCOUNTER — Encounter: Payer: Self-pay | Admitting: Cardiovascular Disease

## 2024-03-02 ENCOUNTER — Ambulatory Visit
Admission: RE | Admit: 2024-03-02 | Discharge: 2024-03-02 | Disposition: A | Discharge status: Home / Self Care | Payer: PPO | Source: Ambulatory Visit | Attending: Urology

## 2024-03-02 DIAGNOSIS — N281 Cyst of kidney, acquired: Secondary | ICD-10-CM

## 2024-03-02 MED ORDER — GADOPICLENOL 0.5 MMOL/ML IV SOLN
10.0000 mL | Freq: Once | INTRAVENOUS | Status: AC | PRN
Start: 2024-03-02 — End: 2024-03-02
  Administered 2024-03-02: 8 mL via INTRAVENOUS

## 2024-03-07 NOTE — Progress Notes (Signed)
 Electrophysiology Office Note:   Date:  03/08/2024  ID:  Jason Salazar, DOB [birth year], MRN [id number]  Primary Cardiologist: Christell Constant, MD Electrophysiologist: Maurice Small, MD      History of Present Illness:   Jason Salazar is a 78 y.o. 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 Izora Ribas.  Discussed the use of AI scribe software for clinical note transcription with the patient, who gave verbal consent to proceed.  History of Present Illness Jason Salazar is a 78 year old male with atrial flutter and atrial fibrillation who presents for discussion of a Watchman device. He was prescribed warfarin for anti-coagulation due to DOAC interactions with his current seizure medications. He has no issues with bleeding chronically. He is not in atrial fibrillation or flutter constantly, but experiences episodes that come and go, and may be contributing to some intermittent lower extremity edema. Otherwise doing well. No new or acute complaints.   Review of systems complete and found to be negative unless listed in HPI.   EP Information / Studies Reviewed:    EKG is not ordered today. EKG from 01/21/24 reviewed which showed sinus rhythm.      Nuclear Stress 02/11/24:    The study is normal. The study is intermediate risk due to depressed ejection fraction.   LV perfusion is normal.   Left ventricular function is abnormal. Nuclear stress EF: 39%. The left ventricular ejection fraction is moderately decreased (30-44%). End diastolic cavity size is normal.   Poor exercise capacity. Patient exercised for 5 min and 0 sec. Maximum HR of 148 bpm. MPHR 102.0%. Peak METS 7.0.   Hypertensive blood pressure during peak  exercise.   Cardiac MRI 06/09/23: FINDINGS: 1. Normal left ventricular size, with LVEDVi 54 mL/m2. Mild concentric hypertrophy 13 mm, with myocardial mass index of 74 g/m2. Normal left ventricular systolic function (LVEF =60%). There are no regional wall motion abnormalities. Left ventricular parametric mapping notable for normal T2 and ECV signals. There is no late gadolinium enhancement in the left ventricular myocardium.   2. Normal right ventricular size with RVEDVI 54 mL/m2. Normal right ventricular thickness  Normal right ventricular systolic function (RVEF =62%). There are no regional wall motion abnormalities or aneurysms.   3.  Normal left and right atrial size.  4. Normal size of the aortic root, ascending aorta and pulmonary artery.   5. Valve assessment: Aortic Valve: Mild thickening. Cannot exclude forme fruste bicuspid valve in this series. No significant regurgitation or stenosis. Pulmonic Valve: No significant regurgitation. Tricuspid Valve: Morphology not well visualized. Mild regurgitation. Mitral Valve: No stenosis.  No significant regurgitation.   6.  Normal pericardium.  No pericardial effusion.   7. Grossly, no extracardiac findings. Recommended dedicated study if concerned for non-cardiac pathology.  Risk Assessment/Calculations:    CHA2DS2-VASc Score = 6   This indicates a 9.7% annual risk of stroke. 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) Vascular Disease History: 0 Age Score: 2 Gender Score: 0     Physical Exam:   VS:  BP (!) 142/60   Pulse 87   Ht 5\' 4"  (1.626 m)   Wt 169 lb 9.6 oz (76.9 kg)   SpO2 95%  BMI 29.11 kg/m    Wt Readings from Last 3 Encounters:  03/08/24 169 lb 9.6 oz (76.9 kg)  02/17/24 173 lb (78.5 kg)  02/11/24 177 lb (80.3 kg)     GEN: Well nourished, well developed in no acute distress NECK: No JVD CARDIAC:  Normal rate, regular rhythm RESPIRATORY:  Clear to auscultation without rales, wheezing or rhonchi  ABDOMEN: Soft, non-distended EXTREMITIES:  No edema; No deformity   ASSESSMENT AND PLAN:    I have seen Jason Salazar in the office today who is being considered for a Watchman left atrial appendage closure device. I believe they will benefit from this procedure given their history of atrial fibrillation, CHA2DS2-VASc score of 6 and unadjusted ischemic stroke rate of 9.7% per year. Unfortunately, the patient is not felt to be a long term anticoagulation candidate secondary to medication interactions prohibiting DOACs and hesitancy/concern with warfarin use long-term. The patient's chart has been reviewed and I feel that they would be a candidate for short term oral anticoagulation after Watchman implant.   It is my belief that after undergoing a LAA closure procedure, Jason Salazar will not need long term anticoagulation which eliminates anticoagulation side effects and major bleeding risk.   Procedural risks for the Watchman implant have been reviewed with the patient including a 0.5% risk of stroke, <1% risk of perforation and <1% risk of device embolization. Other risks include bleeding, vascular damage, tamponade, worsening renal function, and death. The patient understands these risk and wishes to proceed.     The published clinical data on the safety and effectiveness of WATCHMAN include but are not limited to the following: - Holmes DR, Everlene Farrier, Sick P et al. for the PROTECT AF Investigators. Percutaneous closure of the left atrial appendage versus warfarin therapy for prevention of stroke in patients with atrial fibrillation: a randomised non-inferiority trial. Lancet 2009; 374: 534-42. Everlene Farrier, Doshi SK, Isa Rankin D et al. on behalf of the PROTECT AF Investigators. Percutaneous Left Atrial Appendage Closure for Stroke Prophylaxis in Patients With Atrial Fibrillation 2.3-Year  Follow-up of the PROTECT AF (Watchman Left Atrial Appendage System for Embolic Protection in Patients With Atrial Fibrillation) Trial. Circulation 2013; 127:720-729. - Alli O, Doshi S,  Kar S, Reddy VY, Sievert H et al. Quality of Life Assessment in the Randomized PROTECT AF (Percutaneous Closure of the Left Atrial Appendage Versus Warfarin Therapy for Prevention of Stroke in Patients With Atrial Fibrillation) Trial of Patients at Risk for Stroke With Nonvalvular Atrial Fibrillation. J Am Coll Cardiol 2013; 61:1790-8. Aline August DR, Mia [place], Price M, Whisenant B, Sievert H, Doshi S, Huber K, Reddy V. Prospective randomized evaluation of the Watchman left atrial appendage Device in patients with atrial fibrillation versus long-term warfarin therapy; the PREVAIL trial. Journal of the [employer] of Cardiology, Vol. 4, No. 1, 2014, 1-11. - Kar S, Doshi SK, Sadhu A, Salazar R, Osorio J et al. Primary outcome evaluation of a next-generation left atrial appendage closure device: results from the PINNACLE FLX trial. Circulation 2021;143(18)1754-1762.    After today's visit with the patient which was dedicated solely for shared decision making visit regarding LAA closure device, the patient would like to think about his options. If he decides to proceed with the LAA appendage closure procedure, then I would like to obtain a gated CT scan of the chest with contrast timed for PV/LA visualization.   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  Minimally symptomatic. Currently follows with Dr. Nelly Laurence for management.   Secondary Hypercoagulable State  The patient is at significant risk for stroke/thromboembolism based upon his CHA2DS2-VASc Score of 6.  We will refer to our Coumadin Clinic to start warfarin. He can either remain on warfarin indefinitely for stroke prophylaxis, pursue Watchman implant with short-term warfarin use, or have his anti-epileptic regimen changed to accommodate DOACs.   Signed, Nobie Putnam, MD

## 2024-03-08 ENCOUNTER — Encounter: Payer: Self-pay | Admitting: Cardiology

## 2024-03-08 ENCOUNTER — Ambulatory Visit: Payer: PPO | Attending: Cardiology | Admitting: Cardiology

## 2024-03-08 VITALS — BP 142/60 | HR 87 | Ht 64.0 in | Wt 169.6 lb

## 2024-03-08 DIAGNOSIS — I480 Paroxysmal atrial fibrillation: Secondary | ICD-10-CM

## 2024-03-08 DIAGNOSIS — D6869 Other thrombophilia: Secondary | ICD-10-CM

## 2024-03-08 NOTE — Patient Instructions (Signed)
 Medication Instructions:  Your physician recommends that you continue on your current medications as directed. Please refer to the Current Medication list given to you today.  *If you need a refill on your cardiac medications before your next appointment, please call your pharmacy*  Follow-Up: At [facility], you and your health needs are our priority.  As part of our continuing mission to provide you with exceptional heart care, our providers are all part of one team.  This team includes your primary Cardiologist (physician) and Advanced Practice Providers or APPs (Physician Assistants and Nurse Practitioners) who all work together to provide you with the care you need, when you need it.  Your next appointment:   You will be see by the Anticoagulation Clinic to be started on Coumadin.   Call us if you decide to proceed with scheduling Watchman Procedure         1st Floor: - Lobby - Registration  - Pharmacy  - Lab - Cafe  2nd Floor: - PV Lab - Diagnostic Testing (echo, CT, nuclear med)  3rd Floor: - Vacant  4th Floor: - TCTS (cardiothoracic surgery) - AFib Clinic - Structural Heart Clinic - Vascular Surgery  - Vascular Ultrasound  5th Floor: - HeartCare Cardiology (general and EP) - Clinical Pharmacy for coumadin, hypertension, lipid, weight-loss medications, and med management appointments    Valet parking services will be available as well.

## 2024-03-12 ENCOUNTER — Other Ambulatory Visit: Payer: Self-pay

## 2024-03-12 MED ORDER — WARFARIN SODIUM 5 MG PO TABS: 5.0000 mg | ORAL_TABLET | Freq: Every day | ORAL | 11 refills | Status: DC

## 2024-03-15 ENCOUNTER — Encounter: Payer: Self-pay | Admitting: Family Medicine

## 2024-03-16 ENCOUNTER — Encounter: Payer: Self-pay | Admitting: Internal Medicine

## 2024-03-16 ENCOUNTER — Ambulatory Visit: Payer: Medicare Other | Attending: Internal Medicine | Admitting: Internal Medicine

## 2024-03-16 VITALS — BP 162/66 | HR 78 | Temp 98.0°F | Ht 64.0 in | Wt 171.0 lb

## 2024-03-16 DIAGNOSIS — E1169 Type 2 diabetes mellitus with other specified complication: Secondary | ICD-10-CM | POA: Diagnosis not present

## 2024-03-16 DIAGNOSIS — Z7689 Persons encountering health services in other specified circumstances: Secondary | ICD-10-CM

## 2024-03-16 DIAGNOSIS — I480 Paroxysmal atrial fibrillation: Secondary | ICD-10-CM | POA: Diagnosis not present

## 2024-03-16 DIAGNOSIS — I129 Hypertensive chronic kidney disease with stage 1 through stage 4 chronic kidney disease, or unspecified chronic kidney disease: Secondary | ICD-10-CM | POA: Diagnosis not present

## 2024-03-16 DIAGNOSIS — E1122 Type 2 diabetes mellitus with diabetic chronic kidney disease: Secondary | ICD-10-CM | POA: Diagnosis not present

## 2024-03-16 DIAGNOSIS — Z23 Encounter for immunization: Secondary | ICD-10-CM | POA: Diagnosis not present

## 2024-03-16 DIAGNOSIS — Z7984 Long term (current) use of oral hypoglycemic drugs: Secondary | ICD-10-CM

## 2024-03-16 DIAGNOSIS — N1831 Chronic kidney disease, stage 3a: Secondary | ICD-10-CM | POA: Diagnosis not present

## 2024-03-16 DIAGNOSIS — G40909 Epilepsy, unspecified, not intractable, without status epilepticus: Secondary | ICD-10-CM

## 2024-03-16 DIAGNOSIS — G4733 Obstructive sleep apnea (adult) (pediatric): Secondary | ICD-10-CM

## 2024-03-16 DIAGNOSIS — Z7985 Long-term (current) use of injectable non-insulin antidiabetic drugs: Secondary | ICD-10-CM | POA: Diagnosis not present

## 2024-03-16 DIAGNOSIS — Z8546 Personal history of malignant neoplasm of prostate: Secondary | ICD-10-CM

## 2024-03-16 DIAGNOSIS — I5032 Chronic diastolic (congestive) heart failure: Secondary | ICD-10-CM

## 2024-03-16 DIAGNOSIS — E1159 Type 2 diabetes mellitus with other circulatory complications: Secondary | ICD-10-CM

## 2024-03-16 NOTE — Patient Instructions (Addendum)
 Have [facility] fax [facility] immunization record to [facility]. Our fax number is [phone]  VISIT SUMMARY:  You had a new patient visit today to discuss your complex medical history and current health concerns. We reviewed your conditions including diabetes, hypertension, chronic kidney disease, hyperlipidemia, atrial flutter, congestive heart failure, obstructive sleep apnea, seizure disorder, and prostate cancer. We also discussed your recent diagnosis of atrial fibrillation and potential treatment options. Your caregiver provided valuable information about your medical history and current medications.  YOUR PLAN:  -ATRIAL FLUTTER/Atrial Fibrillation: Atrial flutter/atrial fibrillation is a type of irregular heart rhythm that can lead to stroke if not treated. Your cardiologist plans to start you on warfarin to prevent blood clots and preparing for a left atrial appendage occlusion procedure. You will also consult with a cardiologist to finalize the choice of anticoagulant.  -CONGESTIVE HEART FAILURE: Congestive heart failure is a condition where the heart does not pump blood as well as it should. We will continue your current medications, Farxiga and furosemide, and consider adding spironolactone in the future.  -HYPERTENSION: Hypertension, or high blood pressure, needs to be controlled to prevent complications. Looks like you should be on losartan and spironolactone. Regular blood pressure monitoring is important. Goal for blood pressure is 130/80 or lower.  Please limit salt in the foods.  -CHRONIC KIDNEY DISEASE STAGE 3: Chronic kidney disease means your kidneys are not working as well as they should. Controlling your diabetes and hypertension is crucial. Continue taking Farxiga and avoid NSAIDs to protect your kidney function.  -TYPE 2 DIABETES MELLITUS: Type 2 diabetes is a condition where your body does not use insulin properly. You are doing well on Mounjaro, which has helped with weight loss and blood  sugar control. Keep up with your diet and exercise routine and follow up with your endocrinologist as scheduled.  -SEIZURE DISORDER: A seizure disorder is a condition where you have episodes of abnormal brain activity. Your neurologist plans to transition you from Dilantin to Vimpat.  You should be on Dilantin 300 mg at bedtime and Vimpat 50 mg twice a day . Follow the titration schedule provided by your neurologist and adhere to the medication regimen.  -OBSTRUCTIVE SLEEP APNEA: Obstructive sleep apnea is a condition where your breathing stops and starts during sleep. You are using a CPAP machine but are dissatisfied with it. We will arrange for you to consult with a sleep specialist to adjust the settings.  -PROSTATE CANCER: Your prostate cancer was treated with radiation seeds, and you are currently being followed by a urologist. Continue with your scheduled follow-ups.  -GENERAL HEALTH MAINTENANCE: You are due for pneumonia and shingles vaccines, and a Medicare wellness visit. We will also request your eye exam records from Dr. Harlon Flor.  INSTRUCTIONS:  - Follow up with your cardiologist on June 26. - Follow up with your neurologist in eight weeks for blood tests. - Schedule an appointment with Dr. Maple Hudson for CPAP adjustments. - Follow up with your urologist at the end of the month or in June. - Administer pneumonia vaccine. - Verify and administer shingles vaccine if needed. - Schedule Medicare wellness visit. - Request records from Dr. Harlon Flor.

## 2024-03-16 NOTE — Progress Notes (Unsigned)
 Patient ID: Jason Salazar, male    DOB: 06-03-1946  MRN: [id number]  CC: Establish Care (Est care / New pt. /Recently diagnosed with Afib - would like to discuss alternative treatments/Yes to Tdap & pneumonia vax)   Subjective: Jason Salazar is a 78 y.o. male who presents for new pt visit.  Wife, Jason Salazar, is with him. His concerns today include:  Patient with history of DM type II, HTN, CKD 3a, HL, atrial flutter/PAF/Mobitz 1, CHFpEF, OSA on CPAP, syncope, seizure disorder, prostate CA (implanted XRT seeds [facility]now [facility]), obesity,  Discussed the use of AI scribe software for clinical note transcription with the patient, who gave verbal consent to proceed.  History of Present Illness   The patient, a 78 year old with a complex medical history, presents for a new patient visit. He has a history of diabetes type 2, hypertension, chronic kidney disease, hyperlipidemia, atrial flutter/fib, congestive heart failure, obstructive sleep apnea, seizure disorder, and prostate cancer.   Patient did not bring his medications with him to this visit. Previous PCP was Dr. Fleet Salazar.  Seeing me because of insurance change.  Atrial fibrillation/a flutter: Followed by Dr. Izora Salazar.  CHA2DS2Vasc score recorded as 6. Watchman procedure/left atrial appendage occlusion is being planned to prevent long-term use of anticoagulation.  Saw Dr. Jimmey Salazar end of last month to discuss this procedure.  Currently not a candidate for DOAC due to seizure meds.  Plan is for warfarin as a bridge to and shortly after Watchman procedure.  He has not started taking the warfarin as yet.  States it will be started this Friday.  Patient had many questions about the procedure and need for anticoagulation.  DM/Obesity:   The patient reports weight loss since starting Mounjaro (currently on 7.5 mg) and good control of his blood sugar levels.  Has freestyle libre CGM.  Review of his information shows that in the  last 2-weeks PIM has been 95% with no low blood sugar episodes and only 5% of the readings greater than 180. -Followed by endocrinologist Dr. Kalman Salazar.  Last A1c in January was 6.7.  CHF/HTN/CKD: He has been experiencing some swelling in the legs and is on furosemide for fluid management.  Also on Farxiga 10 mg daily, losartan 25 mg daily.  Should also be on spironolactone 12.5 mg daily but states he has not started it yet as no one has explained to him why he needs to take it. Has not taken meds as yet for the morning. - Last echo was done 1 year ago showing EF of 60 to 65%.  However Dr. Jimmey Salazar mentioned in his note that EF was 39% on nuclear stress done 02/11/2024. -GFR over the past several months has ranged between 49-58.  Latest was 58.  Not on NSAIDs.  OSA: The patient uses a CPAP machine for obstructive sleep apnea but reports dissatisfaction with the machine.  Followed by sleep specialist Dr. Maple Salazar.  Seizure disorder: He has not had any recent seizures and is being followed by [facility].  He had a recent visit with the NP there.  Plan is to wean him off Dilantin and changed to Vimpat instead to allow for possible use of DOACs.  He was on Dilantin 400 mg at night.  On recent visit he was told to decrease to 300 mg at night and start Vimpat 50 mg twice a day.  He has not done so as yet.   History of prostate CA:   the patient's prostate cancer  was treated with implanted radiation seeds through [facility]  Due to insurance change, he is currently followed by [facility].  Wife inquires whether he needs to establish with radiation oncologist here.  Previously followed by urology and radiation oncology through [facility].    HM: Agrees to receive Prevnar 20 and Tdap today.  Thinks he has had the shingles vaccine series already.  Will sign release for [place] to get records from his previous PCP. Patient Active Problem List   Diagnosis Date Noted   PAF (paroxysmal atrial  fibrillation) (HCC) 02/04/2024   Chest pressure 02/04/2024   Atypical atrial flutter (HCC) 01/21/2024   Secondary hypercoagulable state (HCC) 01/21/2024   History of syncope 09/24/2023   Bilateral lower extremity edema 09/24/2023   Acute on chronic heart failure with preserved ejection fraction (HCC) 09/24/2023   Class 1 obesity due to excess calories with serious comorbidity and body mass index (BMI) of 32.0 to 32.9 in adult 05/30/2023   Insulin long-term use (HCC) 05/30/2023   Essential hypertension, benign 05/02/2023   Inadequate sleep hygiene 10/30/2021   AV block, 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   Stage 3a chronic kidney disease (HCC) 10/09/2021   Epilepsy (HCC) 04/19/2019   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     aspirin EC 81 MG tablet Take 81 mg by mouth daily.     b complex vitamins tablet Take 1 tablet by mouth daily.     bimatoprost (LUMIGAN) 0.01 % SOLN Instill 1 drop into both eyes at bedtime 7.5 mL 6   Cholecalciferol (VITAMIN D3) 1000 units CAPS Take 1,000 Units by mouth 1 day or 1 dose.     Colchicine 0.6 MG CAPS Take 1 capsule by mouth 2 (two) times daily as needed for gout flare up 30 capsule 2   Continuous Glucose Sensor (FREESTYLE LIBRE 3 PLUS SENSOR) MISC FOR CGM GLUCOSE TESTING., DX: E11.65 ON INSULIN     dapagliflozin propanediol (FARXIGA) 10 MG TABS tablet Take 1 tablet (10 mg total) by mouth daily. 90  tablet 2   DILANTIN 100 MG ER capsule Take 3 capsules (300 mg total) by mouth at bedtime. 90 capsule 0   furosemide (LASIX) 20 MG tablet Take 1 tablet (20 mg total) by mouth daily. 90 tablet 3   gabapentin (NEURONTIN) 300 MG capsule Take 1 capsule (300 mg total) by mouth at bedtime. (Patient taking differently: Take 300 mg by mouth as needed (pain).) 90 capsule 2   lacosamide (VIMPAT) 50 MG TABS tablet Take 1 tablet (50 mg total) by mouth 2 (two) times daily. 60 tablet 0   losartan (COZAAR) 25 MG tablet Take 25 mg by mouth daily.     mometasone (NASONEX) 50 MCG/ACT nasal spray 2 sprays as needed.     Multiple Vitamins-Minerals (ONE-A-DAY MENS 50+ ADVANTAGE PO) Take 1 tablet by mouth daily.     rosuvastatin (CRESTOR) 10 MG tablet  Take 1 tablet by mouth once daily. 90 tablet 1   spironolactone (ALDACTONE) 25 MG tablet Take 0.5 tablets (12.5 mg total) by mouth daily. 45 tablet 3   tirzepatide (MOUNJARO) 7.5 MG/0.5ML Pen Inject 7.5 mg into the skin once a week. 6 mL 1   tiZANidine (ZANAFLEX) 4 MG tablet Take 4 mg by mouth 2 (two) times daily as needed.     Vitamin C 250 MG TABS Take 250 mg by mouth 1 day or 1 dose.     warfarin (COUMADIN) 5 MG tablet Take 1 tablet (5 mg total) by mouth daily. 30 tablet 11   No current facility-administered medications on file prior to visit.    Allergies  Allergen Reactions   Atorvastatin Other (See Comments)    Other reaction(s): lightheaded    Social History   Socioeconomic History   Marital status: Married    Spouse name: Jason Salazar   Number of children: 1   Years of education: HS   Highest education level: Not on file  Occupational History    Employer: [employer]  Tobacco Use   Smoking status: Never   Smokeless tobacco: Never   Tobacco comments:    Never smoked 01/21/24  Vaping Use   Vaping status: Never Used  Substance and Sexual Activity   Alcohol use: No   Drug use: No   Sexual activity: Not on file  Other Topics Concern   Not  on file  Social History Narrative   Patient is married to [place] and has one son.   Caffeine Use: none; quit a few months ago             Social Drivers of Corporate investment banker Strain: Low Risk  (03/16/2024)   Overall Financial Resource Strain (CARDIA)    Difficulty of Paying Living Expenses: Not hard at all  Food Insecurity: No Food Insecurity (03/16/2024)   Hunger Vital Sign    Worried About Running Out of Food in the Last Year: Never true    Ran Out of Food in the Last Year: Never true  Transportation Needs: No Transportation Needs (03/16/2024)   PRAPARE - Administrator, Civil Service (Medical): No    Lack of Transportation (Non-Medical): No  Physical Activity: Inactive (03/16/2024)   Exercise Vital Sign    Days of Exercise per Week: 0 days    Minutes of Exercise per Session: 0 min  Stress: Stress Concern Present (03/16/2024)   [employer] of Occupational Health - Occupational Stress Questionnaire    Feeling of Stress : To some extent  Social Connections: Moderately Integrated (03/16/2024)   Social Connection and Isolation Panel [NHANES]    Frequency of Communication with Friends and Family: Once a week    Frequency of Social Gatherings with Friends and Family: Never    Attends Religious Services: More than 4 times per year    Active Member of [employer] or Organizations: Yes    Attends Engineer, structural: More than 4 times per year    Marital Status: Married  Catering manager Violence: Not At Risk (03/16/2024)   Humiliation, Afraid, Rape, and Kick questionnaire    Fear of Current or Ex-Partner: No    Emotionally Abused: No    Physically Abused: No    Sexually Abused: No    Family History  Problem Relation Age of Onset   CVA Mother    Sleep apnea Brother    Thyroid cancer Father    Prostate cancer Father  Past Surgical History:  Procedure Laterality Date   cyst removed from right kidney      ROS: Review of Systems Negative except as  stated above  PHYSICAL EXAM: BP (!) 162/66 (BP Location: Right Arm, Patient Position: Sitting, Cuff Size: Normal)   Pulse 78   Temp 98 F (36.7 C) (Oral)   Ht 5\' 4"  (1.626 m)   Wt 171 lb (77.6 kg)   SpO2 97%   BMI 29.35 kg/m   Wt Readings from Last 3 Encounters:  03/16/24 171 lb (77.6 kg)  03/08/24 169 lb 9.6 oz (76.9 kg)  02/17/24 173 lb (78.5 kg)    Physical Exam   General appearance - alert, well appearing, older AAM and in no distress Mental status - normal mood, behavior, speech, dress, motor activity, and thought processes Neck - supple, no significant adenopathy Chest - clear to auscultation, no wheezes, rales or rhonchi, symmetric air entry Heart - heart sounds irregular but rate is normal Extremities -  Trace LE edema    Latest Ref Rng & Units 10/07/2023   10:18 AM 09/03/2023    8:24 AM 03/06/2023    8:02 AM  CMP  Glucose 70 - 99 mg/dL 80  130  865   BUN 8 - 27 mg/dL 24  25  27    Creatinine 0.76 - 1.27 mg/dL 7.84  6.96  2.95   Sodium 134 - 144 mmol/L 142  137  138   Potassium 3.5 - 5.2 mmol/L 4.5  4.4  4.8   Chloride 96 - 106 mmol/L 102  98  100   CO2 20 - 29 mmol/L 22  24  23    Calcium 8.6 - 10.2 mg/dL 9.0  9.0  8.8   Total Protein 6.0 - 8.5 g/dL  6.6  6.7   Total Bilirubin 0.0 - 1.2 mg/dL  0.2  0.2   Alkaline Phos 44 - 121 IU/L  135  137   AST 0 - 40 IU/L  27  27   ALT 0 - 44 IU/L  46  36    Lipid Panel     Component Value Date/Time   CHOL 194 09/03/2023 0824   TRIG 204 (H) 09/03/2023 0824   HDL 56 09/03/2023 0824   CHOLHDL 3.5 09/03/2023 0824   CHOLHDL 3.2 03/19/2021 0913   LDLCALC 103 (H) 09/03/2023 0824   LDLCALC 83 03/19/2021 0913    CBC    Component Value Date/Time   WBC 4.6 11/12/2022 1031   RBC 5.30 11/12/2022 1031   HGB 15.1 11/12/2022 1031   HGB 15.4 09/18/2022 0939   HCT 45.5 11/12/2022 1031   HCT 46.5 09/18/2022 0939   PLT 158 11/12/2022 1031   PLT 155 09/18/2022 0939   MCV 85.8 11/12/2022 1031   MCV 84 09/18/2022 0939   MCH  28.5 11/12/2022 1031   MCHC 33.2 11/12/2022 1031   RDW 12.6 11/12/2022 1031   RDW 12.6 09/18/2022 0939   LYMPHSABS 1.3 09/18/2022 0939   MONOABS 0.5 02/19/2014 0843   EOSABS 0.0 09/18/2022 0939   BASOSABS 0.0 09/18/2022 0939    ASSESSMENT AND PLAN: 1. Establishing care with new doctor, encounter for (Primary) -pt to get records from previous PCP  2. Type 2 diabetes mellitus with other specified complication, without long-term current use of insulin (HCC) On Mounjaro with good tolerance and weight loss. A1c within target. Excellent glucose control with Libre monitor. Encouraged exercise and diet. - Continue Mounjaro 7.5 mg and f/u with Dr. Kalman Salazar. -  Microalbumin / creatinine urine ratio  3. PAF (paroxysmal atrial fibrillation) (HCC) Discussed reason for anticoagulation in A.fib to prevent CVA.  He is at high risk for CVA given CHA2DS2Vasc score. Discuss anticoag with warfarin vs Eliquis in preparation for left atrial appendage occlusion.  - Has appt with Coumadin Clinic later this wk . - Consult cardiologist regarding anticoagulant choice.  4. Hypertension associated with diabetes (HCC) Not at goal and has not taken meds as yet for today Continue losartan 25 mg daily. - Educate on spironolactone and consider initiation. - Monitor blood pressure regularly with goal being 130/80 or lower.  5. CKD stage 3a, GFR 45-59 ml/min (HCC) Emphasized diabetes and hypertension control to preserve kidney function. - Continue Farxiga 10 mg daily. - Avoid NSAIDs.  6. Chronic diastolic congestive heart failure (HCC) Continue Farxiga and furosemide 20 mg. - Consider initiating spironolactone.  7. Seizure disorder (HCC) Plan to transition from Dilantin to Vimpat due to high levels. Neurologist provided titration schedule. Emphasized adherence to avoid seizures. - Decrease Dilantin to 300 mg at bedtime. - Start Vimpat 50 mg twice daily. - Follow up with neurologist for further titration  8. OSA  on CPAP Using CPAP but dissatisfied with settings. Plans to consult sleep specialist for adjustments.  9. History of prostate cancer Previously treated with radiation seeds. Follow-up with urologist scheduled. Wife said appt is in June.  Should inquire at that time if he needs to establish with radiation oncologist locally.  10. Need for Tdap vaccination Given today  11. Need for vaccination against Streptococcus pneumoniae - Pneumococcal conjugate vaccine 20-valent    Patient was given the opportunity to ask questions.  Patient verbalized understanding of the plan and was able to repeat key elements of the plan.   This documentation was completed using Paediatric nurse.  Any transcriptional errors are unintentional.  Orders Placed This Encounter  Procedures   Pneumococcal conjugate vaccine 20-valent   Tdap vaccine greater than or equal to 7yo IM   Microalbumin / creatinine urine ratio     Requested Prescriptions    No prescriptions requested or ordered in this encounter    Return in about 4 months (around 07/16/2024) for Give appt with CMA/LPN for Medicare Vist 2-4 wks.  Sign release to get records.  Jonah Blue, MD, FACP

## 2024-03-17 ENCOUNTER — Encounter: Payer: Self-pay | Admitting: Internal Medicine

## 2024-03-17 LAB — MICROALBUMIN / CREATININE URINE RATIO
Creatinine, Urine: 41 mg/dL
Microalb/Creat Ratio: 250 mg/g{creat} — ABNORMAL HIGH (ref 0–29)
Microalbumin, Urine: 102.4 ug/mL

## 2024-03-18 ENCOUNTER — Telehealth: Payer: Self-pay | Admitting: Family Medicine

## 2024-03-18 NOTE — Telephone Encounter (Signed)
 Can you guys reach out to Jason Stepanek and inquire about the unread MyChart message below:  Elvina Sidle there, Jason Salazar. How are you doing with the addition of Vimpat 50mg  twice daily and reduction of Dilantin to 200mg  daily? You feel comfortable with adjusting meds again?   If he is doing well, we will increase Vimapt to 100mg  twice daily and decrease Dilantin to 200mg  daily for the next month.

## 2024-03-18 NOTE — Telephone Encounter (Signed)
 Patient called by staff to inquire about response to initiation of lacosamide. He reports not picking up from the pharmacy due to being concerned about taking it with phenytoin. He did lower dose of phenytoin to 300mg  daily. I discussed plan with patient as previously discussed at his last office visit 02/17/24. He verbalized understanding and will pick up lacosamide, today. Once he gets lacosamide, he will start 50mg  twice daily and continue phenytoin 300mg  for 1 month. Will plan to reduce phenytoin to 200mg  daily and increase lacosamide to 100mg  twice daily if doing well at that time.

## 2024-03-18 NOTE — Telephone Encounter (Signed)
 Left message for patient to call.

## 2024-03-19 ENCOUNTER — Telehealth: Payer: Self-pay

## 2024-03-19 ENCOUNTER — Other Ambulatory Visit: Payer: Self-pay

## 2024-03-19 ENCOUNTER — Ambulatory Visit

## 2024-03-19 DIAGNOSIS — Z5181 Encounter for therapeutic drug level monitoring: Secondary | ICD-10-CM

## 2024-03-19 DIAGNOSIS — I480 Paroxysmal atrial fibrillation: Secondary | ICD-10-CM

## 2024-03-19 NOTE — Telephone Encounter (Signed)
 I spoke with patient who was scheduled for a New Coumadin appt on 4/11, but had only started Coumadin 4/10 evening.  I rescheduled him to 4/17 @ 10:30.  He verbalized understanding

## 2024-03-22 ENCOUNTER — Ambulatory Visit (INDEPENDENT_AMBULATORY_CARE_PROVIDER_SITE_OTHER): Payer: Medicare Other

## 2024-03-22 DIAGNOSIS — R55 Syncope and collapse: Secondary | ICD-10-CM

## 2024-03-22 LAB — CUP PACEART REMOTE DEVICE CHECK
Date Time Interrogation Session: 20250413231640
Implantable Pulse Generator Implant Date: 20231128

## 2024-03-25 ENCOUNTER — Ambulatory Visit: Attending: Cardiology

## 2024-03-25 DIAGNOSIS — I480 Paroxysmal atrial fibrillation: Secondary | ICD-10-CM | POA: Diagnosis not present

## 2024-03-25 DIAGNOSIS — Z7901 Long term (current) use of anticoagulants: Secondary | ICD-10-CM | POA: Diagnosis not present

## 2024-03-25 LAB — POCT INR: INR: 3.5 — AB (ref 2.0–3.0)

## 2024-03-25 NOTE — Patient Instructions (Addendum)
 Description   HOLD today's dose and then START taking 1 tablet daily EXCEPT 1/2 tablet on Sundays and Tuesdays.  Remain consistent with greens each week (1-2 servings per week)  Coumadin Clinic [phone] Pending Watchman device  *On Dilantin*      A full discussion of the nature of anticoagulants has been carried out.  A benefit risk analysis has been presented to the patient, so that they understand the justification for choosing anticoagulation at this time. The need for frequent and regular monitoring, precise dosage adjustment and compliance is stressed.  Side effects of potential bleeding are discussed.  The patient should avoid any OTC items containing aspirin or ibuprofen, and should avoid great swings in general diet.  Avoid alcohol consumption.

## 2024-03-30 ENCOUNTER — Encounter: Payer: Self-pay | Admitting: Cardiovascular Disease

## 2024-03-31 ENCOUNTER — Encounter: Payer: Self-pay | Admitting: Diagnostic Neuroimaging

## 2024-04-01 ENCOUNTER — Ambulatory Visit: Attending: Cardiology | Admitting: *Deleted

## 2024-04-01 ENCOUNTER — Telehealth: Payer: Self-pay

## 2024-04-01 DIAGNOSIS — I480 Paroxysmal atrial fibrillation: Secondary | ICD-10-CM

## 2024-04-01 DIAGNOSIS — Z7901 Long term (current) use of anticoagulants: Secondary | ICD-10-CM

## 2024-04-01 DIAGNOSIS — Z5181 Encounter for therapeutic drug level monitoring: Secondary | ICD-10-CM | POA: Diagnosis not present

## 2024-04-01 LAB — POCT INR: INR: 3.6 — AB (ref 2.0–3.0)

## 2024-04-01 NOTE — Patient Instructions (Addendum)
 Description   Do not take any warfarin today then START taking 1 tablet daily EXCEPT 1/2 tablet on Sundays, Tuesdays, Thursday. Start eating at least one dark green veggie weekly.  Remain consistent with greens each week (1-2 servings per week)  Coumadin  Clinic [phone] Pending Watchman device  *On Dilantin *     Start eating at least one dark green veggie weekly; a normal serving will be fine. (It can be broccoli, spinach, coleslaw, cabbage, or turnip/collard greens)  NEW ADDRESS: [address], [place]

## 2024-04-01 NOTE — Telephone Encounter (Signed)
 Spoke w/Pt and Pt wife regarding the symptoms Pt is reporting. Pt stated the heat he is feeling on his head is on the right side outside his head. Discussed that is not a usual side effect of either Vimpat  (lacosamide ) or Dilantin  (phenytoin ) and inquired if he has had other medication changes. Pt stated he has recently had a change in his Coumadin . Discussed that the symptoms he is having could possibly be due to the dosage change in his Coumadin . Pt stated understanding. Discussed his issues with his medication. Confirmed with Pt that the script for [facility]. [facility] stated [facility] him for pick up. Pt stated he was unable to get his lacosamide  until 4/17. Discussed NP instructions for taking the lacosamide  that he is to take 50 mg twice daily x1 month and take Dilantin  300mg  once daily during that time. Pt and wife stated he reduced his Dilantin  in March after his appt w/NP. Discussed w/Pt to continue the lacosamide  50mg  BID until 5/17 as well as Dilantin  300mg  once daily. As of that date he will need to increase the lacosamide  to 100mg  BID and decrease his Dilantin  to 200mg  once daily per NP instructions on 4/10. Both Pt and wife stated understanding. Wife stated Pt does have refills for both medications and [facility] be [facility] as that had been the reason [facility] gave for not having the script ready in March. Pt or wife is to send a [facility] message to make us  aware if [facility] will [facility]. Both are considering a change in pharmacies as we discussed changing to one of the [employer]

## 2024-04-05 NOTE — Progress Notes (Signed)
 Carelink Summary Report / Loop Recorder

## 2024-04-07 ENCOUNTER — Telehealth: Payer: Self-pay | Admitting: *Deleted

## 2024-04-07 NOTE — Telephone Encounter (Signed)
 Called pt to remind that his Anticoagulation Appt is tomorrow at the new location: [address] inside the building; spoke with pt and he verbalized understanding.

## 2024-04-08 ENCOUNTER — Other Ambulatory Visit (HOSPITAL_BASED_OUTPATIENT_CLINIC_OR_DEPARTMENT_OTHER): Payer: Self-pay

## 2024-04-08 ENCOUNTER — Ambulatory Visit: Attending: Cardiology

## 2024-04-08 ENCOUNTER — Telehealth: Payer: Self-pay | Admitting: *Deleted

## 2024-04-08 ENCOUNTER — Telehealth: Payer: Self-pay

## 2024-04-08 ENCOUNTER — Other Ambulatory Visit: Payer: Self-pay | Admitting: "Endocrinology

## 2024-04-08 ENCOUNTER — Other Ambulatory Visit: Payer: Self-pay | Admitting: Family Medicine

## 2024-04-08 ENCOUNTER — Other Ambulatory Visit (HOSPITAL_COMMUNITY): Payer: Self-pay

## 2024-04-08 DIAGNOSIS — H35033 Hypertensive retinopathy, bilateral: Secondary | ICD-10-CM | POA: Diagnosis not present

## 2024-04-08 DIAGNOSIS — H401131 Primary open-angle glaucoma, bilateral, mild stage: Secondary | ICD-10-CM | POA: Diagnosis not present

## 2024-04-08 DIAGNOSIS — I480 Paroxysmal atrial fibrillation: Secondary | ICD-10-CM

## 2024-04-08 DIAGNOSIS — E119 Type 2 diabetes mellitus without complications: Secondary | ICD-10-CM | POA: Diagnosis not present

## 2024-04-08 DIAGNOSIS — Z5181 Encounter for therapeutic drug level monitoring: Secondary | ICD-10-CM | POA: Diagnosis not present

## 2024-04-08 DIAGNOSIS — H04123 Dry eye syndrome of bilateral lacrimal glands: Secondary | ICD-10-CM | POA: Diagnosis not present

## 2024-04-08 LAB — POCT INR: INR: 4.6 — AB (ref 2.0–3.0)

## 2024-04-08 MED ORDER — LUMIGAN 0.01 % OP SOLN
1.0000 [drp] | Freq: Every evening | OPHTHALMIC | 3 refills | Status: DC
Start: 2024-04-08 — End: 2024-09-13
  Filled 2024-04-08: qty 10, 100d supply, fill #0
  Filled 2024-09-03: qty 10, 100d supply, fill #1

## 2024-04-08 NOTE — Patient Instructions (Signed)
 Description   Do not take any warfarin today and tomorrow then START taking 1/2 tablet daily EXCEPT 1 tablet on Mondays, Wednesdays, and Fridays.  Start eating at least one dark green veggie weekly.  Remain consistent with greens each week (1-2 servings per week)  Coumadin  Clinic [phone] Pending Watchman device  *On Dilantin *

## 2024-04-08 NOTE — Telephone Encounter (Signed)
 Sent message to PA team to initiate PA. Pt has been on this medication is the past.

## 2024-04-08 NOTE — Telephone Encounter (Signed)
 Pharmacy Patient Advocate Encounter   Received notification from Physician's Office that prior authorization for Dilantin  100MG  capsules is required/requested.   Insurance verification completed.   The patient is insured through [facility] ADVANTAGE/RX ADVANCE .   Per test claim: PA required; PA submitted to above mentioned insurance via CoverMyMeds Key/confirmation #/EOC [facility] Status is pending

## 2024-04-08 NOTE — Telephone Encounter (Signed)
   Looks like patient has been taking this medication, maybe a PA could be done?

## 2024-04-09 ENCOUNTER — Other Ambulatory Visit (HOSPITAL_BASED_OUTPATIENT_CLINIC_OR_DEPARTMENT_OTHER): Payer: Self-pay

## 2024-04-09 ENCOUNTER — Other Ambulatory Visit (HOSPITAL_COMMUNITY): Payer: Self-pay

## 2024-04-09 MED FILL — Phenytoin Sodium Extended Cap 100 MG: ORAL | 30 days supply | Qty: 90 | Fill #0 | Status: CN

## 2024-04-09 NOTE — Telephone Encounter (Signed)
 Prior Authorization for Dilantin  100MG  capsules has been APPROVED from 04/08/2024 to 12/08/2024

## 2024-04-09 NOTE — Telephone Encounter (Signed)
 Pharmacy Patient Advocate Encounter  Received notification from [facility] ADVANTAGE/RX ADVANCE that Prior Authorization for Dilantin  100MG  capsules has been APPROVED from 04/08/2024 to 12/08/2024. Unable to obtain price due to refill too soon rejection, last fill date 04/08/2024 next available fill date5/24/2025     PA #/Case ID/Reference #: PA Case ID #: [id number]

## 2024-04-13 ENCOUNTER — Encounter: Payer: Self-pay | Admitting: "Endocrinology

## 2024-04-14 DIAGNOSIS — C61 Malignant neoplasm of prostate: Secondary | ICD-10-CM | POA: Diagnosis not present

## 2024-04-14 DIAGNOSIS — N281 Cyst of kidney, acquired: Secondary | ICD-10-CM | POA: Diagnosis not present

## 2024-04-15 ENCOUNTER — Telehealth: Payer: Self-pay | Admitting: Neurology

## 2024-04-15 NOTE — Telephone Encounter (Signed)
 Received a staff message indicating that the patient needed a sooner apt with Dr Salli Crawley. Pt is scheduled June with Dr Salli Crawley. Was able to call and offer sooner apt 5/22 at 2:30 pm. LVM advising and asked for a call back.

## 2024-04-15 NOTE — Telephone Encounter (Signed)
 Pt returned call and excepted offered appt.

## 2024-04-16 ENCOUNTER — Ambulatory Visit: Attending: Cardiology

## 2024-04-16 ENCOUNTER — Other Ambulatory Visit (HOSPITAL_BASED_OUTPATIENT_CLINIC_OR_DEPARTMENT_OTHER): Payer: Self-pay

## 2024-04-16 DIAGNOSIS — I480 Paroxysmal atrial fibrillation: Secondary | ICD-10-CM

## 2024-04-16 DIAGNOSIS — Z7901 Long term (current) use of anticoagulants: Secondary | ICD-10-CM | POA: Diagnosis not present

## 2024-04-16 DIAGNOSIS — Z5181 Encounter for therapeutic drug level monitoring: Secondary | ICD-10-CM

## 2024-04-16 LAB — POCT INR: INR: 1.3 — AB (ref 2.0–3.0)

## 2024-04-16 NOTE — Patient Instructions (Signed)
 Take 2 tablets tonight only then continue taking 1/2 tablet daily EXCEPT 1 tablet on Mondays, Wednesdays, and Fridays. INR in 1 week. Start eating at least one dark green veggie weekly.  Remain consistent with greens each week (1-2 servings per week)  Coumadin  Clinic [phone] Pending Watchman device  *On Dilantin *

## 2024-04-19 ENCOUNTER — Other Ambulatory Visit (HOSPITAL_BASED_OUTPATIENT_CLINIC_OR_DEPARTMENT_OTHER): Admitting: Pharmacist

## 2024-04-19 ENCOUNTER — Encounter: Payer: Self-pay | Admitting: Pharmacist

## 2024-04-19 DIAGNOSIS — I10 Essential (primary) hypertension: Secondary | ICD-10-CM

## 2024-04-19 MED ORDER — LOSARTAN POTASSIUM 25 MG PO TABS: 25.0000 mg | ORAL_TABLET | Freq: Every day | ORAL | 1 refills | Status: DC

## 2024-04-19 NOTE — Progress Notes (Signed)
 Pharmacy Quality Measure Review  This patient is appearing on a report for the adherence measure for hypertension (ACEi/ARB) medications this calendar year.   Medication: losartan Last fill date: 01/23/2024 for 90 day supply  Contacted pharmacy to facilitate refills. They have him on auto-refill set for tomorrow. I sent in a rxn for them to be able to refill thereafter.   Marene Shape, PharmD, Becky Bowels, CPP Clinical Pharmacist [facility] [phone]

## 2024-04-22 ENCOUNTER — Ambulatory Visit: Payer: HMO | Admitting: "Endocrinology

## 2024-04-22 ENCOUNTER — Other Ambulatory Visit (HOSPITAL_BASED_OUTPATIENT_CLINIC_OR_DEPARTMENT_OTHER): Payer: Self-pay

## 2024-04-23 ENCOUNTER — Other Ambulatory Visit (HOSPITAL_BASED_OUTPATIENT_CLINIC_OR_DEPARTMENT_OTHER): Payer: Self-pay

## 2024-04-23 ENCOUNTER — Ambulatory Visit: Attending: Cardiology

## 2024-04-23 DIAGNOSIS — I480 Paroxysmal atrial fibrillation: Secondary | ICD-10-CM | POA: Diagnosis not present

## 2024-04-23 DIAGNOSIS — Z5181 Encounter for therapeutic drug level monitoring: Secondary | ICD-10-CM | POA: Diagnosis not present

## 2024-04-23 LAB — POCT INR: INR: 3.7 — AB (ref 2.0–3.0)

## 2024-04-23 NOTE — Patient Instructions (Signed)
 Description   HOLD today's dose and then continue taking 1/2 tablet daily EXCEPT 1 tablet on Mondays, Wednesdays, and Fridays.  Recheck INR in 1 week. Remain consistent with greens each week (1-2 servings per week)  Coumadin  Clinic [phone] Pending Watchman device  *On Dilantin *

## 2024-04-26 ENCOUNTER — Ambulatory Visit (INDEPENDENT_AMBULATORY_CARE_PROVIDER_SITE_OTHER): Payer: Medicare Other

## 2024-04-26 DIAGNOSIS — R55 Syncope and collapse: Secondary | ICD-10-CM | POA: Diagnosis not present

## 2024-04-27 ENCOUNTER — Other Ambulatory Visit (HOSPITAL_BASED_OUTPATIENT_CLINIC_OR_DEPARTMENT_OTHER): Payer: Self-pay

## 2024-04-27 ENCOUNTER — Other Ambulatory Visit: Payer: Self-pay | Admitting: *Deleted

## 2024-04-27 LAB — CUP PACEART REMOTE DEVICE CHECK
Date Time Interrogation Session: 20250518232802
Implantable Pulse Generator Implant Date: 20231128

## 2024-04-27 MED FILL — Phenytoin Sodium Extended Cap 100 MG: ORAL | 30 days supply | Qty: 90 | Fill #0 | Status: CN

## 2024-04-27 NOTE — Telephone Encounter (Signed)
  Left message for patient to call to discuss the above.

## 2024-04-27 NOTE — Telephone Encounter (Signed)
 Last seen on 02/17/24 Follow up scheduled on 04/29/24   Dispensed Days Supply Quantity Provider Pharmacy  LACOSAMIDE  50 MG TABLET 03/25/2024 30 60 each Lomax, Amy, NP [id number]     Rx pending to be signed

## 2024-04-28 NOTE — Telephone Encounter (Signed)
 Patient states he is taking Dilantin  300 mg at bedtime and lacosamide  50 mg tablet BID and doing well.

## 2024-04-29 ENCOUNTER — Encounter: Payer: Self-pay | Admitting: Diagnostic Neuroimaging

## 2024-04-29 ENCOUNTER — Ambulatory Visit: Admitting: Diagnostic Neuroimaging

## 2024-04-29 ENCOUNTER — Other Ambulatory Visit: Payer: Self-pay | Admitting: Diagnostic Neuroimaging

## 2024-04-29 VITALS — BP 137/79 | HR 78 | Ht 64.0 in | Wt 168.0 lb

## 2024-04-29 DIAGNOSIS — G40309 Generalized idiopathic epilepsy and epileptic syndromes, not intractable, without status epilepticus: Secondary | ICD-10-CM

## 2024-04-29 DIAGNOSIS — I480 Paroxysmal atrial fibrillation: Secondary | ICD-10-CM | POA: Diagnosis not present

## 2024-04-29 MED ORDER — LACOSAMIDE 50 MG PO TABS: 50.0000 mg | ORAL_TABLET | Freq: Two times a day (BID) | ORAL | 5 refills | Status: DC

## 2024-04-29 MED ORDER — PHENYTOIN SODIUM EXTENDED 100 MG PO CAPS: 300.0000 mg | ORAL_CAPSULE | Freq: Every day | ORAL | 4 refills | Status: DC

## 2024-04-29 NOTE — Patient Instructions (Signed)
  SEIZURE DISORDER (post-traumatic; last seizure 2010) - continue vimpat  50mg  twice a day  - continue dilantin  (BRAND NAME) 300mg  at bedtime - repeat phenytoin  trough level (has been high for last few years, but clinically stable) - now on warfarin anti-coagulation, and being monitored; will avoid changing dilantin  dosing for now until INR is stablized; will plan to coordinate with INR clinic if we decide to adjust dilantin  dosing in future, pending level results - currently on driving restriction currently since March 2025 when we decided to adjust medications; once meds are stable, then may lift restriction from seizure standpoint
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
## 2024-04-29 NOTE — Telephone Encounter (Signed)
 Dr.Penumalli patient scheduled to see you today at 2:30pm. See the below.

## 2024-05-03 ENCOUNTER — Ambulatory Visit: Payer: Self-pay | Admitting: Cardiovascular Disease

## 2024-05-03 ENCOUNTER — Other Ambulatory Visit (HOSPITAL_BASED_OUTPATIENT_CLINIC_OR_DEPARTMENT_OTHER): Payer: Self-pay

## 2024-05-03 ENCOUNTER — Encounter: Payer: Self-pay | Admitting: Internal Medicine

## 2024-05-03 MED FILL — Phenytoin Sodium Extended Cap 100 MG: ORAL | 30 days supply | Qty: 90 | Fill #0 | Status: CN

## 2024-05-04 ENCOUNTER — Encounter: Payer: Self-pay | Admitting: Diagnostic Neuroimaging

## 2024-05-05 ENCOUNTER — Encounter (HOSPITAL_COMMUNITY): Payer: Self-pay

## 2024-05-05 ENCOUNTER — Ambulatory Visit (HOSPITAL_COMMUNITY)
Admission: EM | Admit: 2024-05-05 | Discharge: 2024-05-05 | Disposition: A | Discharge status: Home / Self Care | Attending: Physician Assistant | Admitting: Physician Assistant

## 2024-05-05 DIAGNOSIS — M10342 Gout due to renal impairment, left hand: Secondary | ICD-10-CM

## 2024-05-05 MED ORDER — COLCHICINE 0.6 MG PO TABS: ORAL_TABLET | ORAL | 0 refills | Status: DC

## 2024-05-05 NOTE — ED Triage Notes (Signed)
 Patient here today with c/o left hand swelling and pain X 3 days. Patient has a h/o gout.

## 2024-05-05 NOTE — Telephone Encounter (Signed)
 Dr.Penumalli can you deny Rx so it can be removed from my in basket? You saw patient on 04/29/24 and sent in Rx.  Thanks!

## 2024-05-05 NOTE — Discharge Instructions (Signed)
 I am concerned that you are having a gout flare.  Start colchicine  as prescribed.  It is also possible that this could be the beginning of an infection in your skin.  If you have any spreading redness, fever, increasing pain, swelling, nausea, vomiting you need to be seen immediately.  I would like you to follow-up with your primary care within the next 1 to 2 days to make sure that this is improving.  If anything changes or worsens you need to be seen immediately.

## 2024-05-05 NOTE — ED Provider Notes (Signed)
 [facility]    CSN: [id number] Arrival date & time: 05/05/24  4782      History   Chief Complaint Chief Complaint  Patient presents with   Hand Pain    HPI Jason Salazar is a 78 y.o. male.   Patient presents today with a several day history of left thumb and dorsal hand swelling.  He denies any known injury or increased activity.  Denies any recent trauma or falls.  He is right-handed.  Denies any numbness or paresthesias in his hand.  He reports that yesterday it was much worse and he had difficulty even making a fist but today it has improved.  Pain is currently rated 5 on a 0-10 pain scale, described as aching, no aggravating leaving factors identified.  He reports that this has improved significantly since yesterday.  He has not been taking any over-the-counter medication for symptom management.  He denies any wounds in the area but does report that he picks his thumb for INR testing in this area.  He denies any fever, nausea, vomiting.  He does have a history of gout but typically gets this in his feet.  He does report that he had a gout flare in his toe a few weeks ago.  Denies any recent dietary or medication changes.    Past Medical History:  Diagnosis Date   Allergic rhinitis    Balantidiasis    Cancer (HCC)    prostate cancer treated 2007 with radiation seed placement   Diabetes (HCC)    H/O prostate cancer    Radioactive seeds   Hypercholesteremia    Hyperlipidemia    Hyperlipoproteinemia    Hypertension    Sleep apnea     Patient Active Problem List   Diagnosis Date Noted   Long term (current) use of anticoagulants 03/25/2024   PAF (paroxysmal atrial fibrillation) (HCC) 02/04/2024   Chest pressure 02/04/2024   Atypical atrial flutter (HCC) 01/21/2024   Secondary hypercoagulable state (HCC) 01/21/2024   History of syncope 09/24/2023   Bilateral lower extremity edema 09/24/2023   Acute on chronic heart failure with preserved ejection fraction  (HCC) 09/24/2023   Class 1 obesity due to excess calories with serious comorbidity and body mass index (BMI) of 32.0 to 32.9 in adult 05/30/2023   Insulin  long-term use (HCC) 05/30/2023   Essential hypertension, benign 05/02/2023   Inadequate sleep hygiene 10/30/2021   AV block, 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   Stage 3a chronic kidney disease (HCC) 10/09/2021   Epilepsy (HCC) 04/19/2019   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 Authorizing Provider  colchicine  0.6 MG tablet Take 1.2 mg (2 tablets) today and 0.6 mg (1 tablet) daily for days 2 through 5. 05/05/24  Yes Joselin Crandell K, PA-C  allopurinol  (ZYLOPRIM ) 100 MG tablet Take 100 mg by mouth 2 (two) times  daily.    [provider]  aspirin EC 81 MG tablet Take 81 mg by mouth daily.    [provider]  b complex vitamins tablet Take 1 tablet by mouth daily.    [provider]  bimatoprost  (LUMIGAN ) 0.01 % SOLN Instill 1 drop into both eyes at bedtime 01/02/22     bimatoprost  (LUMIGAN ) 0.01 % SOLN Place 1 drop into both eyes every evening. 04/08/24     Cholecalciferol (VITAMIN D3) 1000 units CAPS Take 1,000 Units by mouth 1 day or 1 dose. 12/09/22   [provider]  Colchicine  0.6 MG CAPS Take 1 capsule by mouth 2 (two) times daily as needed for gout flare up 02/04/22     Continuous Glucose Sensor (FREESTYLE LIBRE 3 PLUS SENSOR)  MISC FOR CGM GLUCOSE TESTING., DX: E11.65 ON INSULIN  12/14/23   [provider]  FARXIGA  10 MG TABS tablet TAKE 1 TABLET BY MOUTH EVERY DAY 04/08/24   Nida, Gebreselassie W, MD  furosemide  (LASIX ) 20 MG tablet Take 1 tablet (20 mg total) by mouth daily. 03/18/23   Chandrasekhar, Caretha Chapel, MD  gabapentin  (NEURONTIN ) 300 MG capsule Take 1 capsule (300 mg total) by mouth at bedtime. Patient taking differently: Take 300 mg by mouth as needed (pain). 11/06/21     lacosamide  (VIMPAT ) 50 MG TABS tablet Take 1 tablet (50 mg total) by mouth 2 (two) times daily. 04/29/24   Penumalli, Brenton Cambridge, MD  losartan  (COZAAR ) 25 MG tablet Take 1 tablet (25 mg total) by mouth daily. 04/19/24   Lawrance Presume, MD  mometasone (NASONEX) 50 MCG/ACT nasal spray 2 sprays as needed. 06/21/20   [provider]  Multiple Vitamins-Minerals (ONE-A-DAY MENS 50+ ADVANTAGE PO) Take 1 tablet by mouth daily.    [provider]  phenytoin  (DILANTIN ) 100 MG ER capsule TAKE 3 CAPSULES BY MOUTH AT BEDTIME. 04/29/24   Penumalli, Vikram R, MD  rosuvastatin  (CRESTOR ) 10 MG tablet Take 1 tablet by mouth once daily. 10/25/22     spironolactone  (ALDACTONE ) 25 MG tablet Take 0.5 tablets (12.5 mg total) by mouth daily. 09/24/23   Chandrasekhar, Mahesh A, MD  tirzepatide  (MOUNJARO ) 7.5 MG/0.5ML Pen Inject 7.5 mg into the skin once a week. 01/09/24   Nida, Gebreselassie W, MD  tiZANidine (ZANAFLEX) 4 MG tablet Take 4 mg by mouth 2 (two) times daily as needed. 04/17/23   [provider]  Vitamin C 250 MG TABS Take 250 mg by mouth 1 day or 1 dose. 12/09/22   [provider]  warfarin (COUMADIN ) 5 MG tablet Take 1 tablet (5 mg total) by mouth daily. 03/12/24   Ardeen Kohler, MD    Family History Family History  Problem Relation Age of Onset   CVA Mother    Sleep apnea Brother    Thyroid cancer Father    Prostate cancer Father     Social History Social History   Tobacco Use   Smoking status: Never    Smokeless tobacco: Never   Tobacco comments:    Never smoked 01/21/24  Vaping Use   Vaping status: Never Used  Substance Use Topics   Alcohol use: No   Drug use: No     Allergies   Atorvastatin   Review of Systems Review of Systems  Constitutional:  Positive for activity change. Negative for appetite change, fatigue and fever.  Gastrointestinal:  Negative for abdominal pain, diarrhea, nausea and vomiting.  Musculoskeletal:  Positive for arthralgias and joint swelling. Negative for myalgias.  Skin:  Positive for color change. Negative for wound.  Neurological:  Negative for weakness, numbness and headaches.     Physical Exam Triage Vital Signs ED Triage Vitals  Encounter Vitals Group     BP 05/05/24 1011 (!) 153/79     Systolic BP Percentile --      Diastolic BP Percentile --      Pulse Rate 05/05/24 1011 88     Resp 05/05/24 1011 16     Temp 05/05/24 1011 98.2 F (36.8 C)     Temp Source 05/05/24 1011 Oral     SpO2 05/05/24 1011 95 %     Weight --      Height --      Head Circumference --      Peak Flow --      Pain Score 05/05/24 1008 5     Pain Loc --      Pain Education --      Exclude from Growth Chart --    No data found.  Updated Vital Signs BP (!) 153/79 (BP Location: Left Arm)   Pulse 88   Temp 98.2 F (36.8 C) (Oral)   Resp 16   SpO2 95%   Visual Acuity Right Eye Distance:   Left Eye Distance:   Bilateral Distance:    Right Eye Near:   Left Eye Near:    Bilateral Near:     Physical Exam Vitals reviewed.  Constitutional:      General: He is awake.     Appearance: Normal appearance. He is well-developed. He is not ill-appearing.     Comments: Very pleasant male appears stated age no acute distress sitting comfortably in exam room  HENT:     Head: Normocephalic and atraumatic.     Right Ear: Tympanic membrane, ear canal and external ear normal. Tympanic membrane is not erythematous or bulging.     Left Ear: Tympanic membrane, ear canal  and external ear normal. Tympanic membrane is not erythematous or bulging.     Mouth/Throat:     Pharynx: No oropharyngeal exudate, posterior oropharyngeal erythema or uvula swelling.  Cardiovascular:     Rate and Rhythm: Normal rate and regular rhythm.     Heart sounds: Normal heart sounds, S1 normal and S2 normal. No murmur heard.    Comments: Capillary refill within 2 seconds bilateral fingers Pulmonary:     Effort: Pulmonary effort is normal.     Breath sounds: Normal breath sounds. No stridor. No wheezing, rhonchi or rales.     Comments: Clear to auscultation bilaterally Musculoskeletal:     Left hand: Swelling present. No tenderness. Normal range of motion. There is no disruption of two-point discrimination. Normal capillary refill.       Arms:     Comments: Left hand: Swelling with associated erythema noted at first MCP joint with swelling into left dorsal hand.  100% fist formation.  Normal pincer grip strength.  Hand is neurovascularly intact.  Neurological:     Mental Status: He is alert.  Psychiatric:        Behavior: Behavior is cooperative.      UC Treatments / Results  Labs (all labs ordered are listed, but only abnormal results are displayed) Labs Reviewed - No data to display  EKG   Radiology No results found.  Procedures Procedures (including critical care time)  Medications Ordered in UC Medications - No data to display  Initial Impression / Assessment and Plan / UC Course  I  have reviewed the triage vital signs and the nursing notes.  Pertinent labs & imaging results that were available during my care of the patient were reviewed by me and considered in my medical decision making (see chart for details).     Patient is well-appearing, afebrile, nontoxic, nontachycardic.  Plan forms were deferred as he denies any recent trauma and has no focal bony tenderness.  Suspect gout as etiology of symptoms given his medical presentation.  He is unable to take  NSAIDs and to limit prednisone due to concurrent Coumadin  use.  Will treat with colchicine  which was sent to his pharmacy.  No indication for dose adjustment based on metabolic panel from 10/07/2023 with creatinine of 1.28 and calculated creatinine clearance of 52.92 mL/min.  We discussed that since he poked himself in this area to test his INR it is possible that this is early cellulitis.  Low suspicion for this as he is afebrile and well-appearing with improving symptoms.  We discussed that this is on the differential and so if his symptoms are not improving quickly with colchicine  or if at any point he has worsening symptoms including increasing pain, swelling, fever, nausea, vomiting he needs to be seen emergently.  Recommended close follow-up with his primary care ideally within 1 to 2 days to ensure that this is healing appropriately and no additional intervention is required.  I discussed concern for gout versus cellulitis with his wife on the telephone who will monitor his symptoms and ensure that he seeks immediate medical attention if his symptoms are not continuing to improve.  Strict return precautions given.  All questions were answered to patient satisfaction.  Final Clinical Impressions(s) / UC Diagnoses   Final diagnoses:  Acute gout due to renal impairment involving left hand     Discharge Instructions      I am concerned that you are having a gout flare.  Start colchicine  as prescribed.  It is also possible that this could be the beginning of an infection in your skin.  If you have any spreading redness, fever, increasing pain, swelling, nausea, vomiting you need to be seen immediately.  I would like you to follow-up with your primary care within the next 1 to 2 days to make sure that this is improving.  If anything changes or worsens you need to be seen immediately.  ED Prescriptions     Medication Sig Dispense Auth. Provider   colchicine  0.6 MG tablet Take 1.2 mg (2 tablets)  today and 0.6 mg (1 tablet) daily for days 2 through 5. 6 tablet Deshea Pooley K, PA-C      PDMP not reviewed this encounter.   Budd Cargo, PA-C 05/05/24 1108

## 2024-05-06 ENCOUNTER — Ambulatory Visit: Attending: Cardiology

## 2024-05-06 DIAGNOSIS — I480 Paroxysmal atrial fibrillation: Secondary | ICD-10-CM

## 2024-05-06 DIAGNOSIS — Z5181 Encounter for therapeutic drug level monitoring: Secondary | ICD-10-CM | POA: Diagnosis not present

## 2024-05-06 LAB — POCT INR: INR: 1.9 — AB (ref 2.0–3.0)

## 2024-05-06 NOTE — Patient Instructions (Signed)
 Description   Take 1 tablet today and then continue taking 1/2 tablet daily EXCEPT 1 tablet on Mondays, Wednesdays, and Fridays.  Recheck INR in 1 week. Remain consistent with greens each week (1-2 servings per week)  Coumadin  Clinic [phone] Pending Watchman device  *On Dilantin *

## 2024-05-07 ENCOUNTER — Encounter: Payer: Self-pay | Admitting: Internal Medicine

## 2024-05-07 LAB — PHENYTOIN LEVEL, FREE AND TOTAL: Phenytoin, Serum: 22.4 ug/mL (ref 10.0–20.0)

## 2024-05-11 ENCOUNTER — Ambulatory Visit: Attending: Internal Medicine

## 2024-05-11 ENCOUNTER — Encounter (INDEPENDENT_AMBULATORY_CARE_PROVIDER_SITE_OTHER): Payer: Self-pay | Admitting: Internal Medicine

## 2024-05-11 ENCOUNTER — Encounter: Payer: Self-pay | Admitting: Diagnostic Neuroimaging

## 2024-05-11 ENCOUNTER — Ambulatory Visit: Admitting: Diagnostic Neuroimaging

## 2024-05-11 DIAGNOSIS — I484 Atypical atrial flutter: Secondary | ICD-10-CM | POA: Diagnosis not present

## 2024-05-12 ENCOUNTER — Ambulatory Visit: Payer: Self-pay | Admitting: Diagnostic Neuroimaging

## 2024-05-13 ENCOUNTER — Ambulatory Visit: Payer: Medicare Other | Admitting: Family Medicine

## 2024-05-13 ENCOUNTER — Telehealth: Payer: Medicare Other | Admitting: Family Medicine

## 2024-05-13 NOTE — Progress Notes (Signed)
 Carelink Summary Report / Loop Recorder

## 2024-05-15 ENCOUNTER — Other Ambulatory Visit (HOSPITAL_BASED_OUTPATIENT_CLINIC_OR_DEPARTMENT_OTHER): Payer: Self-pay

## 2024-05-15 MED ORDER — LACOSAMIDE 50 MG PO TABS
50.0000 mg | ORAL_TABLET | Freq: Two times a day (BID) | ORAL | 5 refills | Status: DC
  Filled 2024-05-28: qty 60, 30d supply, fill #0
  Filled 2024-06-30: qty 60, 30d supply, fill #1
  Filled 2024-07-30 (×2): qty 60, 30d supply, fill #2
  Filled 2024-09-03 (×2): qty 60, 30d supply, fill #3

## 2024-05-15 MED ORDER — INSULIN DEGLUDEC 100 UNIT/ML ~~LOC~~ SOPN: 25.0000 [IU] | PEN_INJECTOR | Freq: Every evening | SUBCUTANEOUS | 3 refills | Status: DC

## 2024-05-15 MED ORDER — PHENYTOIN SODIUM EXTENDED 100 MG PO CAPS
300.0000 mg | ORAL_CAPSULE | Freq: Every day | ORAL | 4 refills | Status: DC
  Filled 2024-05-15 – 2024-05-28 (×3): qty 270, 90d supply, fill #0
  Filled 2024-06-21 (×2): qty 90, 30d supply, fill #0
  Filled ????-??-??: fill #0

## 2024-05-15 MED ORDER — LOSARTAN POTASSIUM 50 MG PO TABS: 50.0000 mg | ORAL_TABLET | Freq: Every day | ORAL | 0 refills | Status: DC

## 2024-05-17 ENCOUNTER — Encounter: Payer: Self-pay | Admitting: Internal Medicine

## 2024-05-17 ENCOUNTER — Telehealth (HOSPITAL_BASED_OUTPATIENT_CLINIC_OR_DEPARTMENT_OTHER): Admitting: Internal Medicine

## 2024-05-17 ENCOUNTER — Ambulatory Visit: Attending: Cardiology

## 2024-05-17 DIAGNOSIS — Z7901 Long term (current) use of anticoagulants: Secondary | ICD-10-CM

## 2024-05-17 DIAGNOSIS — Z5181 Encounter for therapeutic drug level monitoring: Secondary | ICD-10-CM

## 2024-05-17 DIAGNOSIS — I480 Paroxysmal atrial fibrillation: Secondary | ICD-10-CM | POA: Diagnosis not present

## 2024-05-17 DIAGNOSIS — Z029 Encounter for administrative examinations, unspecified: Secondary | ICD-10-CM

## 2024-05-17 DIAGNOSIS — Z0289 Encounter for other administrative examinations: Secondary | ICD-10-CM

## 2024-05-17 LAB — POCT INR: INR: 2.3 (ref 2.0–3.0)

## 2024-05-17 NOTE — Progress Notes (Signed)
 Patient ID: Jason Salazar, male   DOB: 04-Mar-1946, 78 y.o.   MRN: [id number] Virtual Visit via Video Note  I connected with Jason Salazar on 05/17/2024 at 8:14 AM by a video enabled telemedicine application and verified that I am speaking with the correct person using two identifiers.  Location: Patient: home.  Wife, [name], joins us  and provides most of the history Provider: Office   I discussed the limitations of evaluation and management by telemedicine and the availability of in person appointments. The patient expressed understanding and agreed to proceed.  History of Present Illness: Patient with history of DM type II, HTN, CKD 3a, HL, atrial flutter/PAF/Mobitz 1, CHFpEF, OSA on CPAP, syncope, seizure disorder, prostate CA (implanted XRT seeds [facility]), obesity, gout  Discussed the use of AI scribe software for clinical note transcription with the patient, who gave verbal consent to proceed.  History of Present Illness Jason Salazar is a 78 year old male who presents for an FMLA request to accommodate transportation to medical appointments. He is accompanied by his wife, [name], who is his primary caregiver.  [name] is requesting FMLA leave to transport him to medical appointments one to two times a month, for two to four hours each time, from March 17, 2024, to March 16, 2025.  Patient with several chronic medical conditions including seizure disorder, CHF, CKD and atrial fibs/flutter.  Seizure medications currently being adjusted.  Saw his neurologist Dr. Salli Crawley 04/29/2024.  Advised of driving restrictions since seizure medications are being adjusted.   Outpatient Encounter Medications as of 05/17/2024  Medication Sig   allopurinol  (ZYLOPRIM ) 100 MG tablet Take 100 mg by mouth 2 (two) times daily.   aspirin EC 81 MG tablet Take 81 mg by mouth daily.   b complex vitamins tablet Take 1 tablet by mouth daily.   bimatoprost  (LUMIGAN ) 0.01 % SOLN Instill 1  drop into both eyes at bedtime   bimatoprost  (LUMIGAN ) 0.01 % SOLN Place 1 drop into both eyes every evening.   Cholecalciferol (VITAMIN D3) 1000 units CAPS Take 1,000 Units by mouth 1 day or 1 dose.   [Paused] Colchicine  0.6 MG CAPS Take 1 capsule by mouth 2 (two) times daily as needed for gout flare up   colchicine  0.6 MG tablet Take 1.2 mg (2 tablets) today and 0.6 mg (1 tablet) daily for days 2 through 5.   Continuous Glucose Sensor (FREESTYLE LIBRE 3 PLUS SENSOR) MISC FOR CGM GLUCOSE TESTING., DX: E11.65 ON INSULIN    FARXIGA  10 MG TABS tablet TAKE 1 TABLET BY MOUTH EVERY DAY   furosemide  (LASIX ) 20 MG tablet Take 1 tablet (20 mg total) by mouth daily.   gabapentin  (NEURONTIN ) 300 MG capsule Take 1 capsule (300 mg total) by mouth at bedtime. (Patient taking differently: Take 300 mg by mouth as needed (pain).)   insulin  degludec (TRESIBA ) 100 UNIT/ML FlexTouch Pen Inject 25 Units into the skin every evening.   lacosamide  (VIMPAT ) 50 MG TABS tablet Take 1 tablet (50 mg total) by mouth 2 (two) times daily.   lacosamide  (VIMPAT ) 50 MG TABS tablet Take 1 tablet (50 mg total) by mouth 2 (two) times daily.   losartan  (COZAAR ) 25 MG tablet Take 1 tablet (25 mg total) by mouth daily.   losartan  (COZAAR ) 50 MG tablet Take 1 tablet (50 mg total) by mouth daily.   mometasone (NASONEX) 50 MCG/ACT nasal spray 2 sprays as needed.   Multiple Vitamins-Minerals (ONE-A-DAY MENS 50+ ADVANTAGE PO) Take 1 tablet by mouth daily.  phenytoin  (DILANTIN ) 100 MG ER capsule TAKE 3 CAPSULES BY MOUTH AT BEDTIME.   phenytoin  (DILANTIN ) 100 MG ER capsule Take 3 capsules (300 mg total) by mouth at bedtime.   rosuvastatin  (CRESTOR ) 10 MG tablet Take 1 tablet by mouth once daily.   spironolactone  (ALDACTONE ) 25 MG tablet Take 0.5 tablets (12.5 mg total) by mouth daily.   tirzepatide  (MOUNJARO ) 7.5 MG/0.5ML Pen Inject 7.5 mg into the skin once a week.   tiZANidine (ZANAFLEX) 4 MG tablet Take 4 mg by mouth 2 (two) times daily  as needed.   Vitamin C 250 MG TABS Take 250 mg by mouth 1 day or 1 dose.   warfarin (COUMADIN ) 5 MG tablet Take 1 tablet (5 mg total) by mouth daily.   No facility-administered encounter medications on file as of 05/17/2024.      Observations/Objective: Older African-American male sitting in chair in NAD  Assessment and Plan: 1. Administrative encounter (Primary) 2. Encounter for completion of form with patient    Follow Up Instructions: Keep upcoming appointment as scheduled.   I discussed the assessment and treatment plan with the patient. The patient was provided an opportunity to ask questions and all were answered. The patient agreed with the plan and demonstrated an understanding of the instructions.   The patient was advised to call back or seek an in-person evaluation if the symptoms worsen or if the condition fails to improve as anticipated.  I spent 20 minutes dedicated to the care of this patient on the date of this encounter to include previsit review of of chart, face-to-face time with patient and his wife discussing diagnosis and management and post visit completing form.  This note has been created with Education officer, environmental. Any transcriptional errors are unintentional.  Concetta Dee, MD

## 2024-05-17 NOTE — Patient Instructions (Signed)
 continue taking 1/2 tablet daily EXCEPT 1 tablet on Mondays, Wednesdays, and Fridays.  Recheck INR in 2 weeks. Remain consistent with greens each week (1-2 servings per week)  Coumadin  Clinic [phone] Pending Watchman device  *On Dilantin *

## 2024-05-18 ENCOUNTER — Ambulatory Visit: Attending: Internal Medicine

## 2024-05-18 VITALS — Ht 64.0 in | Wt 166.0 lb

## 2024-05-18 DIAGNOSIS — Z0000 Encounter for general adult medical examination without abnormal findings: Secondary | ICD-10-CM | POA: Diagnosis not present

## 2024-05-18 NOTE — Patient Instructions (Signed)
 Mr. Jason Salazar , Thank you for taking time out of your busy schedule to complete your Annual Wellness Visit with me. I enjoyed our conversation and look forward to speaking with you again next year. I, as well as your care team,  appreciate your ongoing commitment to your health goals. Please review the following plan we discussed and let me know if I can assist you in the future. Your Game plan/ To Do List    Referrals: If you haven't heard from the office you've been referred to, please reach out to them at the phone provided.   Follow up Visits: Next Medicare AWV with our clinical staff: 05/31/2025 at 9:50 a.m phone visit with Nurse [place]   Have you seen your provider in the last 6 months (3 months if uncontrolled diabetes)? Yes Next Office Visit with your provider: 07/20/2024 at 8:30 a.m. office visit with Dr. Lincoln Renshaw  Clinician Recommendations:  Aim for 30 minutes of exercise or brisk walking, 6-8 glasses of water, and 5 servings of fruits and vegetables each day.       This is a list of the screening recommended for you and due dates:  Health Maintenance  Topic Date Due   Eye exam for diabetics  Never done   Hepatitis C Screening  Never done   Zoster (Shingles) Vaccine (1 of 2) Never done   Complete foot exam   11/07/2021   COVID-19 Vaccine (6 - 2024-25 season) 04/30/2024   Hemoglobin A1C  07/08/2024   Flu Shot  07/09/2024   Yearly kidney function blood test for diabetes  10/06/2024   Yearly kidney health urinalysis for diabetes  03/16/2025   Medicare Annual Wellness Visit  05/18/2025   DTaP/Tdap/Td vaccine (2 - Td or Tdap) 03/16/2034   Pneumonia Vaccine  Completed   HPV Vaccine  Aged Out   Meningitis B Vaccine  Aged Out    Advanced directives: (Declined) Advance directive discussed with you today. Even though you declined this today, please call our office should you change your mind, and we can give you the proper paperwork for you to fill out. Advance Care Planning is important  because it:  [x]  Makes sure you receive the medical care that is consistent with your values, goals, and preferences  [x]  It provides guidance to your family and loved ones and reduces their decisional burden about whether or not they are making the right decisions based on your wishes.  Follow the link provided in your after visit summary or read over the paperwork we have mailed to you to help you started getting your Advance Directives in place. If you need assistance in completing these, please reach out to us  so that we can help you!  See attachments for Preventive Care and Fall Prevention Tips.

## 2024-05-18 NOTE — Progress Notes (Signed)
 Because this visit was a virtual/telehealth visit,  certain criteria was not obtained, such a blood pressure, CBG if applicable, and timed get up and go. Any medications not marked as taking were not mentioned during the medication reconciliation part of the visit. Any vitals not documented were not able to be obtained due to this being a telehealth visit or patient was unable to self-report a recent blood pressure reading due to a lack of equipment at home via telehealth. Vitals that have been documented are verbally provided by the patient.   Subjective:   Jason Salazar is a 78 y.o. who presents for a Medicare Wellness preventive visit.  As a reminder, Annual Wellness Visits don't include a physical exam, and some assessments may be limited, especially if this visit is performed virtually. We may recommend an in-person follow-up visit with your provider if needed.  Visit Complete: Virtual I connected with  Champ Coma on 05/18/24 by a audio enabled telemedicine application and verified that I am speaking with the correct person using two identifiers.  Patient Location: Home  Provider Location: Office/Clinic  I discussed the limitations of evaluation and management by telemedicine. The patient expressed understanding and agreed to proceed.  Vital Signs: Because this visit was a virtual/telehealth visit, some criteria may be missing or patient reported. Any vitals not documented were not able to be obtained and vitals that have been documented are patient reported.  VideoDeclined- This patient declined Librarian, academic. Therefore the visit was completed with audio only.  Persons Participating in Visit: Patient.  AWV Questionnaire: No: Patient Medicare AWV questionnaire was not completed prior to this visit.  Cardiac Risk Factors include: advanced age (>68men, >52 women);diabetes mellitus;dyslipidemia;hypertension;male gender;sedentary lifestyle      Objective:     Today's Vitals   05/18/24 1004  Weight: 166 lb (75.3 kg)  Height: 5' 4 (1.626 m)  PainSc: 0-No pain   Body mass index is 28.49 kg/m.     05/18/2024   10:05 AM 10/23/2022    8:48 PM  Advanced Directives  Does Patient Have a Medical Advance Directive? No Yes  Type of Special educational needs teacher of [employer];Living will  Does patient want to make changes to medical advance directive?  No - Patient declined  Copy of Healthcare Power of Attorney in Chart?  No - copy requested  Would patient like information on creating a medical advance directive? No - Patient declined     Current Medications (verified) Outpatient Encounter Medications as of 05/18/2024  Medication Sig   allopurinol  (ZYLOPRIM ) 100 MG tablet Take 100 mg by mouth 2 (two) times daily.   aspirin EC 81 MG tablet Take 81 mg by mouth daily.   b complex vitamins tablet Take 1 tablet by mouth daily.   bimatoprost  (LUMIGAN ) 0.01 % SOLN Instill 1 drop into both eyes at bedtime   bimatoprost  (LUMIGAN ) 0.01 % SOLN Place 1 drop into both eyes every evening.   Cholecalciferol (VITAMIN D3) 1000 units CAPS Take 1,000 Units by mouth 1 day or 1 dose.   [Paused] Colchicine  0.6 MG CAPS Take 1 capsule by mouth 2 (two) times daily as needed for gout flare up   colchicine  0.6 MG tablet Take 1.2 mg (2 tablets) today and 0.6 mg (1 tablet) daily for days 2 through 5.   Continuous Glucose Sensor (FREESTYLE LIBRE 3 PLUS SENSOR) MISC FOR CGM GLUCOSE TESTING., DX: E11.65 ON INSULIN    FARXIGA  10 MG TABS tablet TAKE 1 TABLET BY  MOUTH EVERY DAY   furosemide  (LASIX ) 20 MG tablet Take 1 tablet (20 mg total) by mouth daily.   gabapentin  (NEURONTIN ) 300 MG capsule Take 1 capsule (300 mg total) by mouth at bedtime. (Patient taking differently: Take 300 mg by mouth as needed (pain).)   insulin  degludec (TRESIBA ) 100 UNIT/ML FlexTouch Pen Inject 25 Units into the skin every evening.   lacosamide  (VIMPAT ) 50 MG TABS tablet Take 1  tablet (50 mg total) by mouth 2 (two) times daily.   lacosamide  (VIMPAT ) 50 MG TABS tablet Take 1 tablet (50 mg total) by mouth 2 (two) times daily.   losartan  (COZAAR ) 25 MG tablet Take 1 tablet (25 mg total) by mouth daily.   losartan  (COZAAR ) 50 MG tablet Take 1 tablet (50 mg total) by mouth daily.   mometasone (NASONEX) 50 MCG/ACT nasal spray 2 sprays as needed.   Multiple Vitamins-Minerals (ONE-A-DAY MENS 50+ ADVANTAGE PO) Take 1 tablet by mouth daily.   phenytoin  (DILANTIN ) 100 MG ER capsule TAKE 3 CAPSULES BY MOUTH AT BEDTIME.   phenytoin  (DILANTIN ) 100 MG ER capsule Take 3 capsules (300 mg total) by mouth at bedtime.   rosuvastatin  (CRESTOR ) 10 MG tablet Take 1 tablet by mouth once daily.   spironolactone  (ALDACTONE ) 25 MG tablet Take 0.5 tablets (12.5 mg total) by mouth daily.   tirzepatide  (MOUNJARO ) 7.5 MG/0.5ML Pen Inject 7.5 mg into the skin once a week.   tiZANidine (ZANAFLEX) 4 MG tablet Take 4 mg by mouth 2 (two) times daily as needed.   Vitamin C 250 MG TABS Take 250 mg by mouth 1 day or 1 dose.   warfarin (COUMADIN ) 5 MG tablet Take 1 tablet (5 mg total) by mouth daily.   No facility-administered encounter medications on file as of 05/18/2024.    Allergies (verified) Atorvastatin   History: Past Medical History:  Diagnosis Date   Allergic rhinitis    Balantidiasis    Cancer (HCC)    prostate cancer treated 2007 with radiation seed placement   Diabetes (HCC)    H/O prostate cancer    Radioactive seeds   Hypercholesteremia    Hyperlipidemia    Hyperlipoproteinemia    Hypertension    Sleep apnea    Past Surgical History:  Procedure Laterality Date   cyst removed from right kidney     Family History  Problem Relation Age of Onset   CVA Mother    Sleep apnea Brother    Thyroid cancer Father    Prostate cancer Father    Social History   Socioeconomic History   Marital status: Married    Spouse name: [name]   Number of children: 1   Years of  education: HS   Highest education level: Not on file  Occupational History    Employer: [employer]  Tobacco Use   Smoking status: Never   Smokeless tobacco: Never   Tobacco comments:    Never smoked 01/21/24  Vaping Use   Vaping status: Never Used  Substance and Sexual Activity   Alcohol use: No   Drug use: No   Sexual activity: Not on file  Other Topics Concern   Not on file  Social History Narrative   Patient is married to [place] and has one son.   Caffeine Use: none; quit a few months ago             Social Drivers of Corporate investment banker Strain: Low Risk  (05/18/2024)   Overall Financial Resource Strain (CARDIA)  Difficulty of Paying Living Expenses: Not hard at all  Food Insecurity: No Food Insecurity (05/18/2024)   Hunger Vital Sign    Worried About Running Out of Food in the Last Year: Never true    Ran Out of Food in the Last Year: Never true  Transportation Needs: No Transportation Needs (05/18/2024)   PRAPARE - Administrator, Civil Service (Medical): No    Lack of Transportation (Non-Medical): No  Physical Activity: Inactive (05/18/2024)   Exercise Vital Sign    Days of Exercise per Week: 0 days    Minutes of Exercise per Session: 0 min  Stress: No Stress Concern Present (05/18/2024)   [employer] of Occupational Health - Occupational Stress Questionnaire    Feeling of Stress : Not at all  Recent Concern: Stress - Stress Concern Present (03/16/2024)   [employer] of Occupational Health - Occupational Stress Questionnaire    Feeling of Stress : To some extent  Social Connections: Moderately Integrated (05/18/2024)   Social Connection and Isolation Panel [NHANES]    Frequency of Communication with Friends and Family: Once a week    Frequency of Social Gatherings with Friends and Family: Never    Attends Religious Services: More than 4 times per year    Active Member of [employer] or Organizations: Yes    Attends Museum/gallery exhibitions officer: More than 4 times per year    Marital Status: Married    Tobacco Counseling Counseling given: Not Answered Tobacco comments: Never smoked 01/21/24    Clinical Intake:  Pre-visit preparation completed: Yes  Pain : No/denies pain Pain Score: 0-No pain     BMI - recorded: 28.49 Nutritional Status: BMI 25 -29 Overweight Nutritional Risks: None Diabetes: Yes CBG done?: No Did pt. bring in CBG monitor from home?: No  Lab Results  Component Value Date   HGBA1C 6.7 01/09/2024   HGBA1C 7.1 (A) 09/04/2023   HGBA1C 8.7 (A) 05/30/2023     How often do you need to have someone help you when you read instructions, pamphlets, or other written materials from your doctor or pharmacy?: 1 - Never  Interpreter Needed?: No  Information entered by :: Mikiah Demond N. Jaquasia Doscher, LPN.   Activities of Daily Living     05/18/2024   10:09 AM  In your present state of health, do you have any difficulty performing the following activities:  Hearing? 1  Vision? 0  Difficulty concentrating or making decisions? 1  Walking or climbing stairs? 0  Dressing or bathing? 0  Doing errands, shopping? 0  Preparing Food and eating ? N  Using the Toilet? N  In the past six months, have you accidently leaked urine? N  Do you have problems with loss of bowel control? N  Managing your Medications? N  Managing your Finances? N  Housekeeping or managing your Housekeeping? N    Patient Care Team: Lawrance Presume, MD as PCP - General (Internal Medicine) Mealor, Donnamae Gaba, MD as PCP - Electrophysiology (Cardiology) Jann Melody, MD as PCP - Cardiology (Cardiology) Ben Bracken, MD as Consulting Physician (Ophthalmology)  I have updated your Care Teams any recent Medical Services you may have received from other providers in the past year.     Assessment:    This is a routine wellness examination for [facility].  Hearing/Vision screen Hearing Screening - Comments::  Patient has hearing difficulties. Has hearing aids, but does not wear them. Vision Screening - Comments:: Wears rx glasses - up to  date with routine eye exams with Ben Bracken, MD.    Goals Addressed             This Visit's Progress    Client understands the importance of follow-up with providers by attending scheduled visits         Depression Screen     05/18/2024   10:09 AM 03/16/2024    9:03 AM  PHQ 2/9 Scores  PHQ - 2 Score 0 1  PHQ- 9 Score 0 4    Fall Risk     05/18/2024   10:06 AM 03/16/2024    9:02 AM  Fall Risk   Falls in the past year? 1 0  Number falls in past yr: 1 0  Injury with Fall? 1 0  Comment HIT HEAD, NO ER   Risk for fall due to : History of fall(s);Impaired balance/gait;Orthopedic patient No Fall Risks  Follow up Falls evaluation completed;Education provided Falls evaluation completed    MEDICARE RISK AT HOME:  Medicare Risk at Home Any stairs in or around the home?: No If so, are there any without handrails?: No Home free of loose throw rugs in walkways, pet beds, electrical cords, etc?: Yes Adequate lighting in your home to reduce risk of falls?: Yes Life alert?: No Use of a cane, walker or w/c?: No Grab bars in the bathroom?: No Shower chair or bench in shower?: No Elevated toilet seat or a handicapped toilet?: No  TIMED UP AND GO:  Was the test performed?  No  Cognitive Function: 6CIT completed    05/18/2024   10:16 AM  MMSE - Mini Mental State Exam  Not completed: Unable to complete        05/18/2024   10:16 AM  6CIT Screen  What Year? 0 points  What month? 0 points  What time? 0 points  Count back from 20 0 points  Months in reverse 0 points  Repeat phrase 0 points  Total Score 0 points    Immunizations Immunization History  Administered Date(s) Administered   Fluad Quad(high Dose 65+) 09/18/2021   Influenza Split 09/08/2012, 09/08/2013   Influenza, High Dose Seasonal PF 09/02/2019   Influenza-Unspecified  09/08/2012, 08/09/2022   PFIZER(Purple Top)SARS-COV-2 Vaccination 01/22/2020, 02/13/2020, 03/05/2024   PNEUMOCOCCAL CONJUGATE-20 03/16/2024   Pfizer Covid-19 Vaccine Bivalent Booster 64yrs & up 10/26/2021   Pfizer(Comirnaty )Fall Seasonal Vaccine 12 years and older 02/06/2023   Tdap 03/16/2024    Screening Tests Health Maintenance  Topic Date Due   OPHTHALMOLOGY EXAM  Never done   Hepatitis C Screening  Never done   Zoster Vaccines- Shingrix (1 of 2) Never done   FOOT EXAM  11/07/2021   COVID-19 Vaccine (6 - 2024-25 season) 04/30/2024   HEMOGLOBIN A1C  07/08/2024   INFLUENZA VACCINE  07/09/2024   Diabetic kidney evaluation - eGFR measurement  10/06/2024   Diabetic kidney evaluation - Urine ACR  03/16/2025   Medicare Annual Wellness (AWV)  05/18/2025   DTaP/Tdap/Td (2 - Td or Tdap) 03/16/2034   Pneumonia Vaccine 66+ Years old  Completed   HPV VACCINES  Aged Out   Meningococcal B Vaccine  Aged Out    Health Maintenance  Health Maintenance Due  Topic Date Due   OPHTHALMOLOGY EXAM  Never done   Hepatitis C Screening  Never done   Zoster Vaccines- Shingrix (1 of 2) Never done   FOOT EXAM  11/07/2021   COVID-19 Vaccine (6 - 2024-25 season) 04/30/2024    Health Maintenance Items Addressed: Yes  Diabetic Foot Exam recommended  Additional Screening:  Vision Screening: Recommended annual ophthalmology exams for early detection of glaucoma and other disorders of the eye. Would you like a referral to an eye doctor? No  records requested from ophthalmologist.  Dental Screening: Recommended annual dental exams for proper oral hygiene  Community Resource Referral / Chronic Care Management: CRR required this visit?  No   CCM required this visit?  No   Plan:    I have personally reviewed and noted the following in the patient's chart:   Medical and social history Use of alcohol, tobacco or illicit drugs  Current medications and supplements including opioid prescriptions.  Patient is not currently taking opioid prescriptions. Functional ability and status Nutritional status Physical activity Advanced directives List of other physicians Hospitalizations, surgeries, and ER visits in previous 12 months Vitals Screenings to include cognitive, depression, and falls Referrals and appointments  In addition, I have reviewed and discussed with patient certain preventive protocols, quality metrics, and best practice recommendations. A written personalized care plan for preventive services as well as general preventive health recommendations were provided to patient.   Margette Sheldon, LPN   [id number]/61/0960   After Visit Summary: (MyChart) Due to this being a telephonic visit, the after visit summary with patients personalized plan was offered to patient via MyChart   Notes: This patient is overdue for diabetic foot exam, Hep C screening, Covid and Shingrix vaccines.

## 2024-05-19 DIAGNOSIS — N1831 Chronic kidney disease, stage 3a: Secondary | ICD-10-CM | POA: Diagnosis not present

## 2024-05-19 DIAGNOSIS — Z794 Long term (current) use of insulin: Secondary | ICD-10-CM | POA: Diagnosis not present

## 2024-05-19 DIAGNOSIS — E1122 Type 2 diabetes mellitus with diabetic chronic kidney disease: Secondary | ICD-10-CM | POA: Diagnosis not present

## 2024-05-20 ENCOUNTER — Encounter: Payer: Self-pay | Admitting: "Endocrinology

## 2024-05-20 ENCOUNTER — Other Ambulatory Visit (HOSPITAL_BASED_OUTPATIENT_CLINIC_OR_DEPARTMENT_OTHER): Payer: Self-pay

## 2024-05-20 ENCOUNTER — Ambulatory Visit: Admitting: "Endocrinology

## 2024-05-20 VITALS — BP 120/58 | HR 72 | Ht 64.0 in | Wt 169.2 lb

## 2024-05-20 DIAGNOSIS — N1831 Chronic kidney disease, stage 3a: Secondary | ICD-10-CM

## 2024-05-20 DIAGNOSIS — I10 Essential (primary) hypertension: Secondary | ICD-10-CM | POA: Diagnosis not present

## 2024-05-20 DIAGNOSIS — Z7985 Long-term (current) use of injectable non-insulin antidiabetic drugs: Secondary | ICD-10-CM

## 2024-05-20 DIAGNOSIS — Z794 Long term (current) use of insulin: Secondary | ICD-10-CM

## 2024-05-20 DIAGNOSIS — E782 Mixed hyperlipidemia: Secondary | ICD-10-CM

## 2024-05-20 DIAGNOSIS — E1122 Type 2 diabetes mellitus with diabetic chronic kidney disease: Secondary | ICD-10-CM | POA: Diagnosis not present

## 2024-05-20 LAB — POCT GLYCOSYLATED HEMOGLOBIN (HGB A1C): HbA1c, POC (controlled diabetic range): 7.8 % — AB (ref 0.0–7.0)

## 2024-05-20 LAB — COMPREHENSIVE METABOLIC PANEL WITH GFR
ALT: 29 IU/L (ref 0–44)
AST: 16 IU/L (ref 0–40)
Albumin: 4.4 g/dL (ref 3.8–4.8)
Alkaline Phosphatase: 135 IU/L — ABNORMAL HIGH (ref 44–121)
BUN/Creatinine Ratio: 17 (ref 10–24)
BUN: 26 mg/dL (ref 8–27)
Bilirubin Total: 0.4 mg/dL (ref 0.0–1.2)
CO2: 22 mmol/L (ref 20–29)
Calcium: 9 mg/dL (ref 8.6–10.2)
Chloride: 97 mmol/L (ref 96–106)
Creatinine, Ser: 1.49 mg/dL — ABNORMAL HIGH (ref 0.76–1.27)
Globulin, Total: 2.6 g/dL (ref 1.5–4.5)
Glucose: 135 mg/dL — ABNORMAL HIGH (ref 70–99)
Potassium: 4.8 mmol/L (ref 3.5–5.2)
Sodium: 136 mmol/L (ref 134–144)
Total Protein: 7 g/dL (ref 6.0–8.5)
eGFR: 48 mL/min/{1.73_m2} — ABNORMAL LOW (ref >59)

## 2024-05-20 LAB — LIPID PANEL
Chol/HDL Ratio: 3.6 ratio (ref 0.0–5.0)
Cholesterol, Total: 231 mg/dL — ABNORMAL HIGH (ref 100–199)
HDL: 64 mg/dL (ref >39)
LDL Chol Calc (NIH): 142 mg/dL — ABNORMAL HIGH (ref 0–99)
Triglycerides: 140 mg/dL (ref 0–149)
VLDL Cholesterol Cal: 25 mg/dL (ref 5–40)

## 2024-05-20 LAB — T4, FREE: Free T4: 0.87 ng/dL (ref 0.82–1.77)

## 2024-05-20 LAB — TSH: TSH: 1.37 u[IU]/mL (ref 0.450–4.500)

## 2024-05-20 MED ORDER — INSULIN DEGLUDEC 100 UNIT/ML ~~LOC~~ SOPN
20.0000 [IU] | PEN_INJECTOR | Freq: Every day | SUBCUTANEOUS | 1 refills | Status: DC
  Filled 2024-05-20: qty 15, 75d supply, fill #0

## 2024-05-20 NOTE — Progress Notes (Signed)
 05/20/2024, 3:09 PM  Endocrinology follow-up note   Subjective:    Patient ID: Jason Salazar, male    DOB: Nov 17, 1946.  Jason Salazar is being seen in follow-up after he was seen in consultation  for management of currently uncontrolled symptomatic diabetes requested by  Lawrance Presume, MD.   Past Medical History:  Diagnosis Date   Allergic rhinitis    Balantidiasis    Cancer [facility])    prostate cancer treated 2007 with radiation seed placement   Diabetes (HCC)    H/O prostate cancer    Radioactive seeds   Hypercholesteremia    Hyperlipidemia    Hyperlipoproteinemia    Hypertension    Sleep apnea     Past Surgical History:  Procedure Laterality Date   cyst removed from right kidney      Social History   Socioeconomic History   Marital status: Married    Spouse name: [name]   Number of children: 1   Years of education: HS   Highest education level: Not on file  Occupational History    Employer: [employer]  Tobacco Use   Smoking status: Never   Smokeless tobacco: Never   Tobacco comments:    Never smoked 01/21/24  Vaping Use   Vaping status: Never Used  Substance and Sexual Activity   Alcohol use: No   Drug use: No   Sexual activity: Not on file  Other Topics Concern   Not on file  Social History Narrative   Patient is married to [place] and has one son.   Caffeine Use: none; quit a few months ago             Social Drivers of Corporate investment banker Strain: Low Risk  (05/18/2024)   Overall Financial Resource Strain (CARDIA)    Difficulty of Paying Living Expenses: Not hard at all  Food Insecurity: No Food Insecurity (05/18/2024)   Hunger Vital Sign    Worried About Running Out of Food in the Last Year: Never true    Ran Out of Food in the Last Year: Never true  Transportation Needs: No Transportation Needs (05/18/2024)   PRAPARE - Therapist, art (Medical): No    Lack of Transportation (Non-Medical): No  Physical Activity: Inactive (05/18/2024)   Exercise Vital Sign    Days of Exercise per Week: 0 days    Minutes of Exercise per Session: 0 min  Stress: No Stress Concern Present (05/18/2024)   [employer] of Occupational Health - Occupational Stress Questionnaire    Feeling of Stress : Not at all  Recent Concern: Stress - Stress Concern Present (03/16/2024)   [employer] of Occupational Health - Occupational Stress Questionnaire    Feeling of Stress : To some extent  Social Connections: Moderately Integrated (05/18/2024)   Social Connection and Isolation Panel    Frequency of Communication with Friends and Family: Once a week    Frequency of Social Gatherings with Friends and Family: Never    Attends Religious Services: More than 4 times per year    Active Member of [employer] or Organizations: Yes    Attends [employer]  or Organization Meetings: More than 4 times per year    Marital Status: Married    Family History  Problem Relation Age of Onset   CVA Mother    Sleep apnea Brother    Thyroid cancer Father    Prostate cancer Father     Outpatient Encounter Medications as of 05/20/2024  Medication Sig   [DISCONTINUED] insulin  degludec (TRESIBA ) 100 UNIT/ML FlexTouch Pen Inject 25 Units into the skin every evening. (Patient taking differently: Inject 20 Units into the skin once a week.)   allopurinol  (ZYLOPRIM ) 100 MG tablet Take 100 mg by mouth 2 (two) times daily.   aspirin EC 81 MG tablet Take 81 mg by mouth daily.   b complex vitamins tablet Take 1 tablet by mouth daily.   bimatoprost  (LUMIGAN ) 0.01 % SOLN Instill 1 drop into both eyes at bedtime   bimatoprost  (LUMIGAN ) 0.01 % SOLN Place 1 drop into both eyes every evening.   Cholecalciferol (VITAMIN D3) 1000 units CAPS Take 1,000 Units by mouth 1 day or 1 dose.   [Paused] Colchicine  0.6 MG CAPS Take 1 capsule by mouth 2 (two) times daily as needed  for gout flare up   colchicine  0.6 MG tablet Take 1.2 mg (2 tablets) today and 0.6 mg (1 tablet) daily for days 2 through 5.   Continuous Glucose Sensor (FREESTYLE LIBRE 3 PLUS SENSOR) MISC FOR CGM GLUCOSE TESTING., DX: E11.65 ON INSULIN    FARXIGA  10 MG TABS tablet TAKE 1 TABLET BY MOUTH EVERY DAY   furosemide  (LASIX ) 20 MG tablet Take 1 tablet (20 mg total) by mouth daily.   gabapentin  (NEURONTIN ) 300 MG capsule Take 1 capsule (300 mg total) by mouth at bedtime. (Patient taking differently: Take 300 mg by mouth as needed (pain).)   insulin  degludec (TRESIBA ) 100 UNIT/ML FlexTouch Pen Inject 20 Units into the skin at bedtime.   lacosamide  (VIMPAT ) 50 MG TABS tablet Take 1 tablet (50 mg total) by mouth 2 (two) times daily.   lacosamide  (VIMPAT ) 50 MG TABS tablet Take 1 tablet (50 mg total) by mouth 2 (two) times daily.   losartan  (COZAAR ) 25 MG tablet Take 1 tablet (25 mg total) by mouth daily.   losartan  (COZAAR ) 50 MG tablet Take 1 tablet (50 mg total) by mouth daily.   mometasone (NASONEX) 50 MCG/ACT nasal spray 2 sprays as needed.   Multiple Vitamins-Minerals (ONE-A-DAY MENS 50+ ADVANTAGE PO) Take 1 tablet by mouth daily.   phenytoin  (DILANTIN ) 100 MG ER capsule TAKE 3 CAPSULES BY MOUTH AT BEDTIME.   phenytoin  (DILANTIN ) 100 MG ER capsule Take 3 capsules (300 mg total) by mouth at bedtime.   rosuvastatin  (CRESTOR ) 10 MG tablet Take 1 tablet by mouth once daily.   spironolactone  (ALDACTONE ) 25 MG tablet Take 0.5 tablets (12.5 mg total) by mouth daily.   tiZANidine (ZANAFLEX) 4 MG tablet Take 4 mg by mouth 2 (two) times daily as needed.   Vitamin C 250 MG TABS Take 250 mg by mouth 1 day or 1 dose.   warfarin (COUMADIN ) 5 MG tablet Take 1 tablet (5 mg total) by mouth daily.   [DISCONTINUED] tirzepatide  (MOUNJARO ) 7.5 MG/0.5ML Pen Inject 7.5 mg into the skin once a week. (Patient not taking: Reported on 05/20/2024)   No facility-administered encounter medications on file as of 05/20/2024.     ALLERGIES: Allergies  Allergen Reactions   Atorvastatin Other (See Comments)    Other reaction(s): lightheaded    VACCINATION STATUS: Immunization History  Administered Date(s) Administered  Fluad Quad(high Dose 65+) 09/18/2021   Influenza Split 09/08/2012, 09/08/2013   Influenza, High Dose Seasonal PF 09/02/2019   Influenza-Unspecified 09/08/2012, 08/09/2022   PFIZER(Purple Top)SARS-COV-2 Vaccination 01/22/2020, 02/13/2020, 03/05/2024   PNEUMOCOCCAL CONJUGATE-20 03/16/2024   Pfizer Covid-19 Vaccine Bivalent Booster 51yrs & up 10/26/2021   Pfizer(Comirnaty )Fall Seasonal Vaccine 12 years and older 02/06/2023   Tdap 03/16/2024    Diabetes He presents for his follow-up diabetic visit. He has type 2 diabetes mellitus. His disease course has been worsening. There are no hypoglycemic associated symptoms. Pertinent negatives for hypoglycemia include no confusion, headaches, pallor or seizures. Associated symptoms include polydipsia and polyuria. Pertinent negatives for diabetes include no chest pain, no fatigue, no polyphagia and no weakness. There are no hypoglycemic complications. Symptoms are worsening. 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 (Obesity, hyperlipidemia, hypertension, sedentary life) Risk factors for coronary artery disease include diabetes mellitus, dyslipidemia, family history, hypertension, male sex, obesity and sedentary lifestyle. Current diabetic treatment includes insulin  injections and oral agent (monotherapy). His weight is decreasing steadily (This patient has achieved 12 pounds of weight loss so far.). He is following a generally unhealthy diet. When asked about meal planning, he reported none. He has not had a previous visit with a dietitian. He rarely participates in exercise. His home blood glucose trend is increasing steadily. His breakfast blood glucose range is generally 140-180 mg/dl. His lunch blood glucose range is generally 140-180 mg/dl. His  dinner blood glucose range is generally 140-180 mg/dl. His bedtime blood glucose range is generally 140-180 mg/dl. His overall blood glucose range is 140-180 mg/dl. (He presents with increased glycemic profile averaging 179, 57% in range, 3601 hyperglycemia, 7% related to hyperglycemia.  He did not document any hypoglycemia. His point-of-care A1c is 7.8% increasing from 6.7%.  He did not tolerate Mounjaro , got back on Tresiba  and stayed on Farxiga .  ) An ACE inhibitor/angiotensin II receptor blocker is being taken.  Hyperlipidemia This is a chronic problem. The current episode started more than 1 year ago. The problem is controlled. Exacerbating diseases include chronic renal disease, diabetes and obesity. Pertinent negatives include no chest pain, myalgias or shortness of breath. Current antihyperlipidemic treatment includes statins. Risk factors for coronary artery disease include dyslipidemia, diabetes mellitus, hypertension, male sex, obesity, a sedentary lifestyle and family history.  Hypertension This is a chronic problem. The current episode started more than 1 year ago. The problem is controlled. Pertinent negatives include no chest pain, headaches, neck pain, palpitations or shortness of breath. Risk factors for coronary artery disease include dyslipidemia, obesity, male gender, diabetes mellitus and sedentary lifestyle. Past treatments include angiotensin blockers. Hypertensive end-organ damage includes kidney disease and CVA. Identifiable causes of hypertension include chronic renal disease.    Objective:       05/20/2024    8:18 AM 05/18/2024   10:04 AM 05/05/2024   10:11 AM  Vitals with BMI  Height 5' 4 5' 4   Weight 169 lbs 3 oz 166 lbs   BMI 29.03 28.48   Systolic 120  153  Diastolic 58  79  Pulse 72  88    BP (!) 120/58   Pulse 72   Ht 5' 4 (1.626 m)   Wt 169 lb 3.2 oz (76.7 kg)   BMI 29.04 kg/m   Wt Readings from Last 3 Encounters:  05/20/24 169 lb 3.2 oz (76.7 kg)   05/18/24 166 lb (75.3 kg)  04/29/24 168 lb (76.2 kg)       CMP ( most recent)  CMP     Component Value Date/Time   NA 136 05/19/2024 1347   K 4.8 05/19/2024 1347   CL 97 05/19/2024 1347   CO2 22 05/19/2024 1347   GLUCOSE 135 (H) 05/19/2024 1347   GLUCOSE 192 (H) 11/12/2022 1031   BUN 26 05/19/2024 1347   CREATININE 1.49 (H) 05/19/2024 1347   CREATININE 1.32 (H) 11/12/2022 1031   CALCIUM  9.0 05/19/2024 1347   PROT 7.0 05/19/2024 1347   ALBUMIN 4.4 05/19/2024 1347   AST 16 05/19/2024 1347   ALT 29 05/19/2024 1347   ALKPHOS 135 (H) 05/19/2024 1347   BILITOT 0.4 05/19/2024 1347   GFRNONAA 46 (L) 03/19/2021 0913   GFRAA 53 (L) 03/19/2021 0913     Lipid Panel ( most recent) Lipid Panel     Component Value Date/Time   CHOL 231 (H) 05/19/2024 1347   TRIG 140 05/19/2024 1347   HDL 64 05/19/2024 1347   CHOLHDL 3.6 05/19/2024 1347   CHOLHDL 3.2 03/19/2021 0913   LDLCALC 142 (H) 05/19/2024 1347   LDLCALC 83 03/19/2021 0913   LABVLDL 25 05/19/2024 1347      Lab Results  Component Value Date   TSH 1.370 05/19/2024   TSH 2.490 09/03/2023   TSH 2.060 03/06/2023   TSH 1.530 10/09/2021   TSH 1.96 03/19/2021   FREET4 0.87 05/19/2024   FREET4 0.82 09/03/2023    Lipid Panel     Component Value Date/Time   CHOL 231 (H) 05/19/2024 1347   TRIG 140 05/19/2024 1347   HDL 64 05/19/2024 1347   CHOLHDL 3.6 05/19/2024 1347   CHOLHDL 3.2 03/19/2021 0913   LDLCALC 142 (H) 05/19/2024 1347   LDLCALC 83 03/19/2021 0913   LABVLDL 25 05/19/2024 1347     Assessment & Plan:   1. Type 2 diabetes mellitus with stage 3a chronic kidney disease, with long-term current use of insulin  (HCC)   - Jason Salazar has currently uncontrolled symptomatic type 2 DM since  78 years of age.  He presents with increased glycemic profile averaging 179, 57% in range, 3601 hyperglycemia, 7% related to hyperglycemia.  He did not document any hypoglycemia. His point-of-care A1c is 7.8% increasing  from 6.7%.  He did not tolerate Mounjaro , got back on Tresiba  and stayed on Farxiga .    Recent labs reviewed. - I had a long discussion with him about the possible risk factors and  the pathology behind its diabetes and its complications. -his diabetes is complicated by CVA, CKD stage 3a, obesity/sedentary life, comorbid hypertension, hyperlipidemia, and he remains at exceedingly high risk for more acute and chronic complications which include CAD, CVA, CKD, retinopathy, and neuropathy. These are all discussed in detail with him.  - I discussed all available options of managing his diabetes including de-escalation of medications. I have counseled him on Food as Medicine by adopting a Whole Food , Plant Predominant  ( WFPP) nutrition as recommended by [employer] of Lifestyle Medicine. Patient is encouraged to switch to  unprocessed or minimally processed  complex starch, adequate protein intake (mainly plant source), minimal liquid fat, plenty of fruits, and vegetables. -  he is advised to stick to a routine mealtimes to eat 3 complete meals a day and snack only when necessary ( to snack only to correct hypoglycemia BG <70 day time or <100 at night).   - he acknowledges that there is a room for improvement in his food and drink choices. - Suggestion is made for him to avoid simple  carbohydrates  from his diet including Cakes, Sweet Desserts, Ice Cream, Soda (diet and regular), Sweet Tea, Candies, Chips, Cookies, Store Bought Juices, Alcohol , Artificial Sweeteners,  Coffee Creamer, and Sugar-free Products, Lemonade. This will help patient to have more stable blood glucose profile and potentially avoid unintended weight gain.  The following Lifestyle Medicine recommendations according to American College of Lifestyle Medicine  [facility]) were discussed and and offered to patient and he  agrees to start the journey:  A. Whole Foods, Plant-Based Nutrition comprising of fruits and vegetables,  plant-based proteins, whole-grain carbohydrates was discussed in detail with the patient.   A list for source of those nutrients were also provided to the patient.  Patient will use only water or unsweetened tea for hydration. B.  The need to stay away from risky substances including alcohol, smoking; obtaining 7 to 9 hours of restorative sleep, at least 150 minutes of moderate intensity exercise weekly, the importance of healthy social connections,  and stress management techniques were discussed. C.  A full color page of  Calorie density of various food groups per pound showing examples of each food groups was provided to the patient.    - he will be scheduled with Penny Crumpton, RDN, CDE for individualized diabetes education.  - I have approached him with the following individualized plan to manage  his diabetes and patient agrees:  -In light of his intolerance to Mounjaro  he is advised to stay off of it for now.  He is advised to continue on Tresiba  20 units nightly, continue to utilize his CGM.   - he is encouraged to call clinic for blood glucose levels less than 70 or above 200 mg /dl weekly average blood glucose. - he is benefiting from his SGLT2 inhibitors.  He is advised to continue Farxiga  10 mg p.o. daily at breakfast.   - His previsit labs show slight worsening of CKD, will not be initiated on metformin  for now.    -Targets for  A1c;  LDL, HDL,  and Triglycerides were discussed with the patient.  2) Blood Pressure /Hypertension:    His blood pressure is controlled to target.  he is advised to continue his current medications including clonidine 0.1 mg p.o. daily, losartan  25 mg p.o. daily with breakfast .  3) Lipids/Hyperlipidemia: Review of his previsit labs show worsening of dyslipidemia with LDL at 142 from 103.  He is advised to continue Crestor  10 mg p.o. nightly.     Side effects and precautions discussed with him.  4) obesity wit class 1-  Body mass index is 29.04 kg/m.   -He is currently losing weight, high BMI clearly complicating his diabetes care.   he is  a candidate for weight loss. I discussed with him the fact that loss of 5 - 10% of his  current body weight will have the most impact on his diabetes management.  The above detailed  ACLM recommendations for nutrition, exercise, sleep, social life, avoidance of risky substances, the need for restorative sleep   information will also detailed on discharge instructions.  5) Chronic Care/Health Maintenance:  -he  is on ACEI/ARB and Statin medications and  is encouraged to initiate and continue to follow up with Ophthalmology, Dentist,  Podiatrist at least yearly or according to recommendations, and advised to   stay away from smoking. I have recommended yearly flu vaccine and pneumonia vaccine at least every 5 years; moderate intensity exercise for up to 150 minutes weekly; and  sleep for  7- 9 hours a day.  - he is  advised to maintain close follow up with Lawrance Presume, MD for primary care needs, as well as his other providers for optimal and coordinated care.  I spent  26  minutes in the care of the patient today including review of labs from CMP, Lipids, Thyroid Function, Hematology (current and previous including abstractions from other facilities); face-to-face time discussing  his blood glucose readings/logs, discussing hypoglycemia and hyperglycemia episodes and symptoms, medications doses, his options of short and long term treatment based on the latest standards of care / guidelines;  discussion about incorporating lifestyle medicine;  and documenting the encounter. Risk reduction counseling performed per USPSTF guidelines to reduce  obesity and cardiovascular risk factors.     Please refer to Patient Instructions for Blood Glucose Monitoring and Insulin /Medications Dosing Guide  in media tab for additional information. Please  also refer to  Patient Self Inventory in the Media  tab for reviewed elements  of pertinent patient history.  Jason Salazar participated in the discussions, expressed understanding, and voiced agreement with the above plans.  All questions were answered to his satisfaction. he is encouraged to contact clinic should he have any questions or concerns prior to his return visit.    Follow up plan: - Return in about 3 months (around 08/20/2024) for Bring Meter/CGM Device/Logs- A1c in Office.  Jason Orf, MD [facility] [address] [place], [place] [address] Phone: [phone]  Fax: [phone]    05/20/2024, 3:09 PM  This note was partially dictated with voice recognition software. Similar sounding words can be transcribed inadequately or may not  be corrected upon review.

## 2024-05-20 NOTE — Patient Instructions (Signed)

## 2024-05-21 ENCOUNTER — Telehealth: Payer: Self-pay | Admitting: Internal Medicine

## 2024-05-21 NOTE — Telephone Encounter (Signed)
 Patient came in today and signed the release of information and paid the $29 form fee for his spouse's FMLA.  Form in Dr. Daril Edge box.

## 2024-05-22 ENCOUNTER — Other Ambulatory Visit (HOSPITAL_BASED_OUTPATIENT_CLINIC_OR_DEPARTMENT_OTHER): Payer: Self-pay

## 2024-05-22 ENCOUNTER — Encounter: Payer: Self-pay | Admitting: "Endocrinology

## 2024-05-24 ENCOUNTER — Other Ambulatory Visit (HOSPITAL_BASED_OUTPATIENT_CLINIC_OR_DEPARTMENT_OTHER): Payer: Self-pay

## 2024-05-24 ENCOUNTER — Other Ambulatory Visit: Payer: Self-pay

## 2024-05-24 DIAGNOSIS — Z794 Long term (current) use of insulin: Secondary | ICD-10-CM

## 2024-05-24 MED ORDER — FREESTYLE LIBRE 3 PLUS SENSOR MISC
1 refills | Status: DC
  Filled 2024-05-24: qty 6, 90d supply, fill #0
  Filled 2024-09-03: qty 6, 90d supply, fill #1

## 2024-05-24 NOTE — Telephone Encounter (Signed)
 Chart Reviewed: Patient to fill out parts Parts A, Part B #6, Parts C #13-18, Part D #19  of wife's FMLA Paperwork.  Questions for patient: Is there a cardiac condition that keeps him from driving? My understanding that this limitation was related to his seizure history. This will inform the information about duration of condition and leave request (section A) Section B is met; multiple visits for coumadin  clinic; but I need the information above to assess Section C 14. Unable to fill out section D (more information needed).  At last cardiac evaluation: rare chest pressure, was getting on going seizure management.  Please see the MyChart message reply(ies) for my assessment and plan.    This patient gave consent for this Medical Advice Message and is aware that it may result in a bill to [employer], as well as the possibility of receiving a bill for a co-payment or deductible. They are an established patient, but are not seeking medical advice exclusively about a problem treated during an in person or video visit in the last seven days. I did not recommend an in person or video visit within seven days of my reply.    I spent a total of 13 minutes cumulative time within 7 days through [employer].  Jann Melody, MD

## 2024-05-25 NOTE — Telephone Encounter (Signed)
 I have called pt and left a message to call our office.

## 2024-05-25 NOTE — Telephone Encounter (Signed)
 Left a message to call back.  Would like to know what exactly is needed on FMLA forms.  Pt wife is requesting FMLA.
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
## 2024-05-26 NOTE — Telephone Encounter (Signed)
 Called pt spoke with spouse advised of MD response.  Will place FMLA forms at front desk for pick up and refund of $29 fee.

## 2024-05-27 ENCOUNTER — Ambulatory Visit (INDEPENDENT_AMBULATORY_CARE_PROVIDER_SITE_OTHER): Payer: Self-pay

## 2024-05-27 DIAGNOSIS — R55 Syncope and collapse: Secondary | ICD-10-CM

## 2024-05-27 LAB — CUP PACEART REMOTE DEVICE CHECK
Date Time Interrogation Session: 20250618021214
Implantable Pulse Generator Implant Date: 20231128
Zone Setting Status: 755011
Zone Setting Status: 755011
Zone Setting Status: 755011
Zone Setting Status: 755011

## 2024-05-27 NOTE — Telephone Encounter (Signed)
 Spoke with patient, he advised he would call back when his wife gets home to make the payment. Aware to call main number and select option 2 for billing.

## 2024-05-28 ENCOUNTER — Ambulatory Visit: Payer: Self-pay | Admitting: Cardiovascular Disease

## 2024-05-28 ENCOUNTER — Other Ambulatory Visit: Payer: Self-pay

## 2024-05-28 ENCOUNTER — Other Ambulatory Visit (HOSPITAL_BASED_OUTPATIENT_CLINIC_OR_DEPARTMENT_OTHER): Payer: Self-pay

## 2024-05-28 ENCOUNTER — Encounter: Payer: Self-pay | Admitting: Internal Medicine

## 2024-05-28 ENCOUNTER — Encounter (HOSPITAL_BASED_OUTPATIENT_CLINIC_OR_DEPARTMENT_OTHER): Payer: Self-pay

## 2024-05-31 ENCOUNTER — Ambulatory Visit: Payer: Medicare Other

## 2024-06-01 ENCOUNTER — Other Ambulatory Visit (HOSPITAL_BASED_OUTPATIENT_CLINIC_OR_DEPARTMENT_OTHER): Payer: Self-pay

## 2024-06-01 ENCOUNTER — Other Ambulatory Visit: Payer: Self-pay

## 2024-06-01 NOTE — Telephone Encounter (Signed)
 Patient identified by name and date of birth.  Patient aware of appointment and why it was scheduled.
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
## 2024-06-03 ENCOUNTER — Ambulatory Visit (INDEPENDENT_AMBULATORY_CARE_PROVIDER_SITE_OTHER)

## 2024-06-03 ENCOUNTER — Ambulatory Visit: Payer: PPO | Attending: Nurse Practitioner | Admitting: Nurse Practitioner

## 2024-06-03 ENCOUNTER — Other Ambulatory Visit (HOSPITAL_BASED_OUTPATIENT_CLINIC_OR_DEPARTMENT_OTHER): Payer: Self-pay

## 2024-06-03 VITALS — BP 124/60 | HR 72 | Ht 64.0 in | Wt 171.0 lb

## 2024-06-03 DIAGNOSIS — G4733 Obstructive sleep apnea (adult) (pediatric): Secondary | ICD-10-CM | POA: Diagnosis not present

## 2024-06-03 DIAGNOSIS — E7800 Pure hypercholesterolemia, unspecified: Secondary | ICD-10-CM | POA: Diagnosis not present

## 2024-06-03 DIAGNOSIS — I480 Paroxysmal atrial fibrillation: Secondary | ICD-10-CM | POA: Diagnosis not present

## 2024-06-03 DIAGNOSIS — Z5181 Encounter for therapeutic drug level monitoring: Secondary | ICD-10-CM

## 2024-06-03 DIAGNOSIS — I10 Essential (primary) hypertension: Secondary | ICD-10-CM

## 2024-06-03 DIAGNOSIS — N1831 Chronic kidney disease, stage 3a: Secondary | ICD-10-CM | POA: Diagnosis not present

## 2024-06-03 LAB — POCT INR: INR: 1.3 — AB (ref 2.0–3.0)

## 2024-06-03 MED ORDER — ROSUVASTATIN CALCIUM 20 MG PO TABS
20.0000 mg | ORAL_TABLET | Freq: Every day | ORAL | 1 refills | Status: DC
  Filled 2024-06-03 – 2024-06-21 (×2): qty 90, 90d supply, fill #0

## 2024-06-03 NOTE — Progress Notes (Signed)
Please see anticoagulation encounter.

## 2024-06-03 NOTE — Patient Instructions (Signed)
 Medication Instructions:  INCREASE Crestor  20mg  Take 1 tablet once day  *If you need a refill on your cardiac medications before your next appointment, please call your pharmacy*  Lab Work: 8 WEEKS FASTING LIPIDS & LFTS-(07/29/2024) 1 MONTH BMET-(07/03/2024) If you have labs (blood work) drawn today and your tests are completely normal, you will receive your results only by: MyChart Message (if you have MyChart) OR A paper copy in the mail If you have any lab test that is abnormal or we need to change your treatment, we will call you to review the results.  Testing/Procedures: NONE ORDERED  Follow-Up: At [facility], you and your health needs are our priority.  As part of our continuing mission to provide you with exceptional heart care, our providers are all part of one team.  This team includes your primary Cardiologist (physician) and Advanced Practice Providers or APPs (Physician Assistants and Nurse Practitioners) who all work together to provide you with the care you need, when you need it.  Your next appointment:   6 month(s)  Provider:   Stanly DELENA Leavens, MD or Jackee Alberts, NP   We recommend signing up for the patient portal called MyChart.  Sign up information is provided on this After Visit Summary.  MyChart is used to connect with patients for Virtual Visits (Telemedicine).  Patients are able to view lab/test results, encounter notes, upcoming appointments, etc.  Non-urgent messages can be sent to your provider as well.   To learn more about what you can do with MyChart, go to [email or web address].   Other Instructions

## 2024-06-03 NOTE — Patient Instructions (Signed)
 Description   Take 1 tablet today and then resume taking 1/2 tablet daily EXCEPT 1 tablet on Mondays, Wednesdays, and Fridays.  Recheck INR in 2 weeks. Remain consistent with greens each week (1-2 servings per week)  Cardiac Clearance Fax #[phone] Coumadin  Clinic [phone] Pending Watchman device  *On Dilantin *

## 2024-06-07 ENCOUNTER — Encounter: Payer: Self-pay | Admitting: Internal Medicine

## 2024-06-07 ENCOUNTER — Ambulatory Visit: Attending: Internal Medicine | Admitting: Internal Medicine

## 2024-06-07 VITALS — BP 144/69 | HR 82 | Temp 98.3°F | Wt 171.0 lb

## 2024-06-07 DIAGNOSIS — R079 Chest pain, unspecified: Secondary | ICD-10-CM

## 2024-06-07 DIAGNOSIS — Z01818 Encounter for other preprocedural examination: Secondary | ICD-10-CM | POA: Diagnosis not present

## 2024-06-07 DIAGNOSIS — I480 Paroxysmal atrial fibrillation: Secondary | ICD-10-CM | POA: Diagnosis not present

## 2024-06-07 DIAGNOSIS — Z7189 Other specified counseling: Secondary | ICD-10-CM | POA: Diagnosis not present

## 2024-06-07 DIAGNOSIS — K029 Dental caries, unspecified: Secondary | ICD-10-CM | POA: Diagnosis not present

## 2024-06-07 DIAGNOSIS — Z7901 Long term (current) use of anticoagulants: Secondary | ICD-10-CM | POA: Diagnosis not present

## 2024-06-07 NOTE — Patient Instructions (Signed)
 I will contact your cardiologist to make sure it is okay for us  to have you hold the Coumadin  for several days prior to your dental extraction.  I will get back to you once I hear from him.

## 2024-06-07 NOTE — Progress Notes (Signed)
 Patient ID: Jakiah Goree, male    DOB: 06-Nov-1946  MRN: [id number]  CC: Pre-op Exam (Pre-op exam./R chest pain X1 day/Yes to shingles vax)   Subjective: Ashaz Robling is a 78 y.o. male who presents for chronic ds management. His concerns today include:  Patient with history of DM type II, HTN, CKD 3a, HL, atrial flutter/PAF/Mobitz 1, CHFpEF, OSA on CPAP, syncope, seizure disorder, prostate CA (implanted XRT seeds [facility]), obesity, gout   Discussed the use of AI scribe software for clinical note transcription with the patient, who gave verbal consent to proceed.  History of Present Illness Jermani Eberlein is a 78 year old male who presents for clearance to have a tooth extracted.  Main concern is with him being on Coumadin  and question of whether it needs to be held prior to having this 1 tooth extracted.  No date has been set as yet for the extraction. Patient is followed by Coumadin  clinic from his cardiologist.  Last visit was several days ago and INR at that time was 1.3.  When I last saw him, there was question of him having left atrial appendage occlusion procedure by interventional cardiology.  Patient states no date has been set.  They are waiting for him to decide whether he wants to have the procedure done or not.   CHA2DS2Vasc score is 6  He experienced an episode of right-sided chest pain earlier today while he was sitting down talking to his wife.  It was pressure-like with no associated shortness of breath, palpitations or radiation down the arm.  Episode lasted 3-5 minutes.  Patient had nuclear stress testing done in March of this year that came back okay.  He has an allergy to atorvastatin and no known allergies to antibiotics. He is currently taking insulin  and Farxiga , having been taken off Mounjaro  by his endocrinologist due to intolerance.    Patient Active Problem List   Diagnosis Date Noted   Long term (current) use of anticoagulants  03/25/2024   PAF (paroxysmal atrial fibrillation) (HCC) 02/04/2024   Chest pressure 02/04/2024   Atypical atrial flutter (HCC) 01/21/2024   Secondary hypercoagulable state (HCC) 01/21/2024   History of syncope 09/24/2023   Bilateral lower extremity edema 09/24/2023   Acute on chronic heart failure with preserved ejection fraction (HCC) 09/24/2023   Class 1 obesity due to excess calories with serious comorbidity and body mass index (BMI) of 32.0 to 32.9 in adult 05/30/2023   Long-term current use of injectable noninsulin antidiabetic medication 05/30/2023   Essential hypertension, benign 05/02/2023   Inadequate sleep hygiene 10/30/2021   AV block, 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   Stage 3a chronic kidney disease (HCC) 10/09/2021   Epilepsy (HCC) 04/19/2019   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/12/2019   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     aspirin EC 81 MG tablet Take 81 mg by mouth daily.     b complex  vitamins tablet Take 1 tablet by mouth daily.     bimatoprost  (LUMIGAN ) 0.01 % SOLN Instill 1 drop into both eyes at bedtime 7.5 mL 6   bimatoprost  (LUMIGAN ) 0.01 % SOLN Place 1 drop into both eyes every evening. 22.5 mL 3   Cholecalciferol (VITAMIN D3) 1000 units CAPS Take 1,000 Units by mouth 1 day or 1 dose.     colchicine  0.6 MG tablet Take 1.2 mg (2 tablets) today and 0.6 mg (1 tablet) daily for days 2 through 5. 6 tablet 0   Continuous Glucose Sensor (FREESTYLE  LIBRE 3 PLUS SENSOR) MISC Change sensor every 15 days. 6 each 1   FARXIGA  10 MG TABS tablet TAKE 1 TABLET BY MOUTH EVERY DAY 90 tablet 1   furosemide  (LASIX ) 20 MG tablet Take 1 tablet (20 mg total) by mouth daily. 90 tablet 3   gabapentin  (NEURONTIN ) 300 MG capsule Take 1 capsule (300 mg total) by mouth at bedtime. 90 capsule 2   insulin  degludec (TRESIBA ) 100 UNIT/ML FlexTouch Pen Inject 20 Units into the skin at bedtime. 15 mL 1   lacosamide  (VIMPAT ) 50 MG TABS tablet Take 1 tablet (50 mg total) by mouth 2 (two) times daily. 60 tablet 5   lacosamide  (VIMPAT ) 50 MG TABS tablet Take 1 tablet (50 mg total) by mouth 2 (two) times daily. 60 tablet 5   losartan  (COZAAR ) 25 MG tablet Take 1 tablet (25 mg total) by mouth daily. 90 tablet 1   losartan  (COZAAR ) 50 MG tablet Take 1 tablet (50 mg total) by mouth daily. 90 tablet 0   mometasone (NASONEX) 50 MCG/ACT nasal spray 2 sprays as needed.     Multiple Vitamins-Minerals (ONE-A-DAY MENS 50+ ADVANTAGE PO) Take 1 tablet by mouth daily.     phenytoin  (DILANTIN ) 100 MG ER capsule TAKE 3 CAPSULES BY MOUTH AT BEDTIME. 270 capsule 4   phenytoin  (DILANTIN ) 100 MG ER capsule Take 3 capsules (300 mg total) by mouth at bedtime. 270 capsule 4   rosuvastatin  (CRESTOR ) 20 MG tablet Take 1 tablet (20 mg total) by mouth daily. 90 tablet 1   spironolactone  (ALDACTONE ) 25 MG tablet Take 0.5 tablets (12.5 mg total) by mouth daily. 45 tablet 3   tiZANidine (ZANAFLEX) 4 MG tablet Take 4 mg by mouth 2 (two) times daily as needed.     Vitamin C 250 MG TABS Take 250 mg by mouth 1 day or 1 dose.     warfarin (COUMADIN ) 5 MG tablet Take 1 tablet (5 mg total) by mouth daily. 30 tablet 11   [Paused] Colchicine  0.6 MG CAPS Take 1 capsule by mouth 2 (two) times daily as needed for gout flare up (Patient not taking: Reported on 06/07/2024) 30 capsule 2   No current facility-administered medications on file prior to visit.    Allergies  Allergen Reactions   Atorvastatin Other  (See Comments)    Other reaction(s): lightheaded    Social History   Socioeconomic History   Marital status: Married    Spouse name: Almarie   Number of children: 1   Years of education: HS   Highest education level: 12th grade  Occupational History    Employer: [employer]  Tobacco Use   Smoking status: Never   Smokeless tobacco: Never   Tobacco comments:    Never smoked 01/21/24  Vaping Use   Vaping status: Never Used  Substance and Sexual Activity   Alcohol use: No   Drug use: No   Sexual activity: Not  on file  Other Topics Concern   Not on file  Social History Narrative   Patient is married to [place] and has one son.   Caffeine Use: none; quit a few months ago             Social Drivers of Corporate investment banker Strain: Low Risk  (06/04/2024)   Overall Financial Resource Strain (CARDIA)    Difficulty of Paying Living Expenses: Not hard at all  Food Insecurity: No Food Insecurity (06/04/2024)   Hunger Vital Sign    Worried About Running Out of Food in the Last Year: Never true    Ran Out of Food in the Last Year: Never true  Transportation Needs: No Transportation Needs (06/04/2024)   PRAPARE - Administrator, Civil Service (Medical): No    Lack of Transportation (Non-Medical): No  Physical Activity: Insufficiently Active (06/04/2024)   Exercise Vital Sign    Days of Exercise per Week: 1 day    Minutes of Exercise per Session: 10 min  Stress: No Stress Concern Present (06/04/2024)   [employer] of Occupational Health - Occupational Stress Questionnaire    Feeling of Stress: Not at all  Recent Concern: Stress - Stress Concern Present (03/16/2024)   [employer] of Occupational Health - Occupational Stress Questionnaire    Feeling of Stress : To some extent  Social Connections: Socially Integrated (06/04/2024)   Social Connection and Isolation Panel    Frequency of Communication with Friends and Family: More than three times a  week    Frequency of Social Gatherings with Friends and Family: Once a week    Attends Religious Services: More than 4 times per year    Active Member of [employer] or Organizations: Yes    Attends Banker Meetings: Never    Marital Status: Married  Catering manager Violence: Not At Risk (05/18/2024)   Humiliation, Afraid, Rape, and Kick questionnaire    Fear of Current or Ex-Partner: No    Emotionally Abused: No    Physically Abused: No    Sexually Abused: No    Family History  Problem Relation Age of Onset   CVA Mother    Sleep apnea Brother    Thyroid cancer Father    Prostate cancer Father     Past Surgical History:  Procedure Laterality Date   cyst removed from right kidney      ROS: Review of Systems Negative except as stated above  PHYSICAL EXAM: BP (!) 144/69 (BP Location: Left Arm, Patient Position: Sitting, Cuff Size: Normal)   Pulse 82   Temp 98.3 F (36.8 C) (Oral)   Wt 171 lb (77.6 kg)   SpO2 97%   BMI 29.35 kg/m   Physical Exam   General appearance - alert, well appearing, and in no distress Mental status - normal mood, behavior, speech, dress, motor activity, and thought processes Mouth -patient has quite a number of his teeth missing.  Incisor and left lower jaw is broken off.  This is the one that will be extracted Neck - supple, no significant adenopathy Chest - clear to auscultation, no wheezes, rales or rhonchi, symmetric air entry Heart - normal rate, regular rhythm, normal S1, S2, no murmurs, rubs, clicks or gallops.  Patient sounds to be in sinus rhythm at this time Extremities - peripheral pulses normal, no pedal edema, no clubbing or cyanosis     Latest Ref Rng & Units 05/19/2024    1:47 PM 10/07/2023  10:18 AM 09/03/2023    8:24 AM  CMP  Glucose 70 - 99 mg/dL 864  80  881   BUN 8 - 27 mg/dL 26  24  25    Creatinine 0.76 - 1.27 mg/dL 8.50  8.71  8.53   Sodium 134 - 144 mmol/L 136  142  137   Potassium 3.5 - 5.2 mmol/L 4.8  4.5   4.4   Chloride 96 - 106 mmol/L 97  102  98   CO2 20 - 29 mmol/L 22  22  24    Calcium  8.6 - 10.2 mg/dL 9.0  9.0  9.0   Total Protein 6.0 - 8.5 g/dL 7.0   6.6   Total Bilirubin 0.0 - 1.2 mg/dL 0.4   0.2   Alkaline Phos 44 - 121 IU/L 135   135   AST 0 - 40 IU/L 16   27   ALT 0 - 44 IU/L 29   46    Lipid Panel     Component Value Date/Time   CHOL 231 (H) 05/19/2024 1347   TRIG 140 05/19/2024 1347   HDL 64 05/19/2024 1347   CHOLHDL 3.6 05/19/2024 1347   CHOLHDL 3.2 03/19/2021 0913   LDLCALC 142 (H) 05/19/2024 1347   LDLCALC 83 03/19/2021 0913    CBC    Component Value Date/Time   WBC 4.6 11/12/2022 1031   RBC 5.30 11/12/2022 1031   HGB 15.1 11/12/2022 1031   HGB 15.4 09/18/2022 0939   HCT 45.5 11/12/2022 1031   HCT 46.5 09/18/2022 0939   PLT 158 11/12/2022 1031   PLT 155 09/18/2022 0939   MCV 85.8 11/12/2022 1031   MCV 84 09/18/2022 0939   MCH 28.5 11/12/2022 1031   MCHC 33.2 11/12/2022 1031   RDW 12.6 11/12/2022 1031   RDW 12.6 09/18/2022 0939   LYMPHSABS 1.3 09/18/2022 0939   MONOABS 0.5 02/19/2014 0843   EOSABS 0.0 09/18/2022 0939   BASOSABS 0.0 09/18/2022 0939    ASSESSMENT AND PLAN: 1. Preoperative evaluation to rule out surgical contraindication (Primary) - I will send a message to patient's cardiologist to make sure no left atrial appendage occlusion procedure is planned within the next several weeks.  If this is not planned, we can go ahead and have him hold the Coumadin  for 3 to 5 days prior to the extraction without having to bridge.  Patient understands there is a slight increased risk of having a stroke whenever the anticoagulant is stopped/interrupted.  2. Dental decay 3. PAF (paroxysmal atrial fibrillation) (HCC) 4. Long term (current) use of anticoagulants See #1 above.  5. Chest pain at rest Patient reports short episode of right-sided pressure-like chest pain today that resolved on its own.  Advised that if he has any further episodes, he should be  seen in the emergency room and let his cardiologist know.  Reassuring that he has had negative stress test done 3 months ago.  6. Advance directive discussed with patient At the end of the visit patient wanted to let me know that he was trying to put together a healthcare power of attorney.  He showed me the document and asked for clarification of how to fill it out.  Explained to him what a healthcare power of attorney is and I went through the form with him showing him how to fill it out.  Advised that he would need to have it notarized once he has completed it. 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   Patient was given the opportunity to ask questions.  Patient verbalized understanding of the plan and was able to repeat key elements of the plan.   This documentation was completed using Paediatric nurse.  Any transcriptional errors are unintentional.  No orders of the defined types were placed in this encounter.    Requested Prescriptions    No prescriptions requested or ordered in this encounter    No follow-ups on file.  Barnie Louder, MD, FACP

## 2024-06-08 ENCOUNTER — Telehealth: Payer: Self-pay

## 2024-06-08 ENCOUNTER — Telehealth: Payer: Self-pay | Admitting: *Deleted

## 2024-06-08 NOTE — Telephone Encounter (Signed)
   Pre-operative Risk Assessment    Patient Name: Jason Salazar  DOB: 01-07-1946 MRN: [id number]   Date of last office visit: 06/03/24 Date of next office visit: 11/23/24  Request for Surgical Clearance    Procedure:  Dental Extraction - Amount of Teeth to be Pulled:  tooth #21   Date of Surgery:  Clearance TBD                                 Surgeon:    Surgeon's Group or Practice Name:  [facility]  Phone number:  [phone] Fax number:  [phone]   Type of Clearance Requested:   - Medical  - Pharmacy:  Hold Aspirin and Warfarin (Coumadin ) Not indicated    Type of Anesthesia:  Local using Lidocaine    Additional requests/questions:    Bonney Rebeca Blight   06/08/2024, 3:35 PM

## 2024-06-08 NOTE — Telephone Encounter (Signed)
 Spoke with [facility] and was told they would be happy to fax over the clearance to us  as they have only sent to PCP and gave our fax number which is [phone].

## 2024-06-08 NOTE — Telephone Encounter (Signed)
   Patient Name: Jason Salazar  DOB: 1946-09-14 MRN: [id number]  Primary Cardiologist: Stanly DELENA Leavens, MD  Chart reviewed as part of pre-operative protocol coverage.   Simple dental extractions (i.e. 1-2 teeth) are considered low risk procedures per guidelines and generally do not require any specific cardiac clearance. It is also generally accepted that for simple extractions and dental cleanings, there is no need to interrupt blood thinner therapy.   SBE prophylaxis is not required for the patient from a cardiac standpoint.  I will route this recommendation to the requesting party via Epic fax function and remove from pre-op pool.  Please call with questions.  Wyn Raddle, Jackee Shove, NP 06/08/2024, 3:39 PM

## 2024-06-08 NOTE — Telephone Encounter (Signed)
-----   Message from Barnie Louder sent at 06/07/2024  5:50 PM EDT ----- Regarding: Coumadin  I am the PCP for this pt. I saw him today.  He needs to have 1 tooth extracted.  I wanted to touch base with you first before having him stop Coumadin .  I see there was questionable plan to do left atrial appendage occlusion procedure given his atrial fibrillation.  Is there any plans to do this within the next several weeks?  If not, can we have him stop the Coumadin  for about 4 days prior to his extraction without having to bridge him?

## 2024-06-08 NOTE — Telephone Encounter (Signed)
 Called pt to inquire of the Practice/Dentist name to gather more information. Generally, if the patient is having a single tooth extraction the warfarin is not held and advised will need proper clearance if the Dentist is requiring an interruption in warfarin by the Cardiologist.   Wife and patient provided the name of the practice as [facility] at [address] [facility] and they provided a telephone number as well as [phone]. Advised I would call to inquire and there was no answer so left a message for them to call back regarding this.   Our fax number is [phone] for clearance forms.

## 2024-06-09 ENCOUNTER — Telehealth: Payer: Self-pay | Admitting: Internal Medicine

## 2024-06-09 ENCOUNTER — Encounter: Payer: Self-pay | Admitting: Internal Medicine

## 2024-06-09 NOTE — Telephone Encounter (Signed)
-----   Message from Nurse Candance H sent at 06/08/2024  2:36 PM EDT ----- Regarding: RE: Coumadin  Good afternoon Dr. Vicci,   Thanks for the update. I have called the [facility] and they are going to fax over a clearance form so Dr. Santo and Pharmacy Team can review and complete a proper clearance regarding the dental extraction. The patient has been made aware of this and is aware we will keep him updated.   Thanks, IT sales professional, RN ----- Message ----- From: Vicci Barnie NOVAK, MD Sent: 06/07/2024   5:54 PM EDT To: Lavanda NOVAK Search, RN; Mahesh A Chandrasekhar# Subject: Coumadin                                        I am the PCP for this pt. I saw him today.  He needs to have 1 tooth extracted.  I wanted to touch base with you first before having him stop Coumadin .  I see there was questionable plan to do left atrial appendage occlusion procedure given his atrial fibrillation.  Is there any plans to do this within the next several weeks?  If not, can we have him stop the Coumadin  for about 4 days prior to his extraction without having to bridge him?

## 2024-06-14 ENCOUNTER — Other Ambulatory Visit: Payer: Self-pay

## 2024-06-14 ENCOUNTER — Other Ambulatory Visit (HOSPITAL_BASED_OUTPATIENT_CLINIC_OR_DEPARTMENT_OTHER): Payer: Self-pay

## 2024-06-14 DIAGNOSIS — I480 Paroxysmal atrial fibrillation: Secondary | ICD-10-CM

## 2024-06-14 DIAGNOSIS — E7800 Pure hypercholesterolemia, unspecified: Secondary | ICD-10-CM

## 2024-06-14 DIAGNOSIS — G4733 Obstructive sleep apnea (adult) (pediatric): Secondary | ICD-10-CM

## 2024-06-14 DIAGNOSIS — N1831 Chronic kidney disease, stage 3a: Secondary | ICD-10-CM

## 2024-06-14 DIAGNOSIS — I10 Essential (primary) hypertension: Secondary | ICD-10-CM

## 2024-06-17 ENCOUNTER — Ambulatory Visit: Attending: Cardiology | Admitting: *Deleted

## 2024-06-17 DIAGNOSIS — I480 Paroxysmal atrial fibrillation: Secondary | ICD-10-CM

## 2024-06-17 DIAGNOSIS — Z5181 Encounter for therapeutic drug level monitoring: Secondary | ICD-10-CM | POA: Diagnosis not present

## 2024-06-17 LAB — POCT INR: POC INR: 2.5

## 2024-06-17 NOTE — Progress Notes (Signed)
Please see anticoagulation encounter.

## 2024-06-17 NOTE — Patient Instructions (Signed)
 Description   Continue taking 1/2 tablet daily EXCEPT 1 tablet on Mondays, Wednesdays, and Fridays.  Recheck INR in 3 weeks. Remain consistent with greens each week (1-2 servings per week)  Cardiac Clearance Fax #[phone] Coumadin  Clinic [phone] Pending Watchman device  *On Dilantin *

## 2024-06-18 NOTE — Addendum Note (Signed)
 Addended by: TAWNI DRILLING D on: 06/18/2024 10:12 AM   Modules accepted: Orders

## 2024-06-18 NOTE — Progress Notes (Signed)
 Carelink Summary Report / Loop Recorder

## 2024-06-21 ENCOUNTER — Other Ambulatory Visit (HOSPITAL_BASED_OUTPATIENT_CLINIC_OR_DEPARTMENT_OTHER): Payer: Self-pay

## 2024-06-21 ENCOUNTER — Other Ambulatory Visit: Payer: Self-pay | Admitting: Internal Medicine

## 2024-06-21 ENCOUNTER — Ambulatory Visit: Admitting: Family Medicine

## 2024-06-22 ENCOUNTER — Other Ambulatory Visit (HOSPITAL_BASED_OUTPATIENT_CLINIC_OR_DEPARTMENT_OTHER): Payer: Self-pay

## 2024-06-22 ENCOUNTER — Other Ambulatory Visit: Payer: Self-pay

## 2024-06-22 MED ORDER — DAPAGLIFLOZIN PROPANEDIOL 10 MG PO TABS
10.0000 mg | ORAL_TABLET | Freq: Every day | ORAL | 1 refills | Status: DC
  Filled 2024-06-22 – 2024-06-25 (×2): qty 90, 90d supply, fill #0

## 2024-06-22 MED FILL — Dapagliflozin Propanediol Tab 10 MG (Base Equivalent): ORAL | 90 days supply | Qty: 90 | Fill #0 | Status: AC

## 2024-06-23 ENCOUNTER — Other Ambulatory Visit (HOSPITAL_BASED_OUTPATIENT_CLINIC_OR_DEPARTMENT_OTHER): Payer: Self-pay

## 2024-06-24 ENCOUNTER — Other Ambulatory Visit (HOSPITAL_BASED_OUTPATIENT_CLINIC_OR_DEPARTMENT_OTHER): Payer: Self-pay

## 2024-06-25 ENCOUNTER — Other Ambulatory Visit (HOSPITAL_BASED_OUTPATIENT_CLINIC_OR_DEPARTMENT_OTHER): Payer: Self-pay

## 2024-06-28 ENCOUNTER — Ambulatory Visit: Payer: Self-pay

## 2024-06-28 DIAGNOSIS — R55 Syncope and collapse: Secondary | ICD-10-CM

## 2024-06-29 LAB — CUP PACEART REMOTE DEVICE CHECK
Date Time Interrogation Session: 20250720233204
Implantable Pulse Generator Implant Date: 20231128

## 2024-06-30 ENCOUNTER — Other Ambulatory Visit: Payer: Self-pay

## 2024-07-03 ENCOUNTER — Ambulatory Visit: Payer: Self-pay | Admitting: Cardiovascular Disease

## 2024-07-05 ENCOUNTER — Ambulatory Visit: Payer: Medicare Other

## 2024-07-08 ENCOUNTER — Ambulatory Visit: Attending: Cardiology

## 2024-07-08 DIAGNOSIS — Z5181 Encounter for therapeutic drug level monitoring: Secondary | ICD-10-CM

## 2024-07-08 DIAGNOSIS — I480 Paroxysmal atrial fibrillation: Secondary | ICD-10-CM | POA: Diagnosis not present

## 2024-07-08 DIAGNOSIS — Z7901 Long term (current) use of anticoagulants: Secondary | ICD-10-CM | POA: Diagnosis not present

## 2024-07-08 LAB — POCT INR: INR: 2.7 (ref 2.0–3.0)

## 2024-07-08 NOTE — Patient Instructions (Signed)
 Description   Continue taking 1/2 tablet daily EXCEPT 1 tablet on Mondays, Wednesdays, and Fridays.  Recheck INR in 4 weeks. Remain consistent with greens each week (1-2 servings per week)  Cardiac Clearance Fax #[phone] Coumadin  Clinic [phone] Pending Watchman device  *On Dilantin *

## 2024-07-08 NOTE — Progress Notes (Signed)
 INR 2.7  Please see anticoagulation encounter

## 2024-07-20 ENCOUNTER — Ambulatory Visit: Attending: Internal Medicine | Admitting: Internal Medicine

## 2024-07-20 ENCOUNTER — Encounter: Payer: Self-pay | Admitting: Internal Medicine

## 2024-07-20 VITALS — BP 150/70 | HR 69 | Temp 98.2°F | Ht 64.0 in | Wt 179.0 lb

## 2024-07-20 DIAGNOSIS — Z8546 Personal history of malignant neoplasm of prostate: Secondary | ICD-10-CM | POA: Diagnosis not present

## 2024-07-20 DIAGNOSIS — E1169 Type 2 diabetes mellitus with other specified complication: Secondary | ICD-10-CM | POA: Diagnosis not present

## 2024-07-20 DIAGNOSIS — G40909 Epilepsy, unspecified, not intractable, without status epilepticus: Secondary | ICD-10-CM

## 2024-07-20 DIAGNOSIS — N1831 Chronic kidney disease, stage 3a: Secondary | ICD-10-CM

## 2024-07-20 DIAGNOSIS — Z794 Long term (current) use of insulin: Secondary | ICD-10-CM | POA: Diagnosis not present

## 2024-07-20 DIAGNOSIS — Z7984 Long term (current) use of oral hypoglycemic drugs: Secondary | ICD-10-CM | POA: Diagnosis not present

## 2024-07-20 DIAGNOSIS — I152 Hypertension secondary to endocrine disorders: Secondary | ICD-10-CM | POA: Diagnosis not present

## 2024-07-20 DIAGNOSIS — Z6830 Body mass index (BMI) 30.0-30.9, adult: Secondary | ICD-10-CM | POA: Diagnosis not present

## 2024-07-20 DIAGNOSIS — E669 Obesity, unspecified: Secondary | ICD-10-CM

## 2024-07-20 DIAGNOSIS — I5032 Chronic diastolic (congestive) heart failure: Secondary | ICD-10-CM

## 2024-07-20 DIAGNOSIS — E1159 Type 2 diabetes mellitus with other circulatory complications: Secondary | ICD-10-CM

## 2024-07-20 DIAGNOSIS — E119 Type 2 diabetes mellitus without complications: Secondary | ICD-10-CM

## 2024-07-20 MED ORDER — LOSARTAN POTASSIUM 25 MG PO TABS: 25.0000 mg | ORAL_TABLET | Freq: Every day | ORAL | 1 refills | Status: DC

## 2024-07-20 MED ORDER — INSULIN DEGLUDEC 100 UNIT/ML ~~LOC~~ SOPN: 22.0000 [IU] | PEN_INJECTOR | Freq: Every day | SUBCUTANEOUS | 1 refills | Status: DC

## 2024-07-20 NOTE — Progress Notes (Signed)
 Patient ID: Jason Salazar, male    DOB: October 20, 1946  MRN: [id number]  CC: chronic ds management   Subjective: Jason Salazar is a 78 y.o. male who presents for chronic ds management. Son is with him. His concerns today include:  Patient with history of DM type II, HTN, CKD 3a, HL, atrial flutter/PAF/Mobitz 1, CHFpEF, OSA on CPAP, syncope, seizure disorder, prostate CA (implanted XRT seeds [facility]), obesity, gout   Discussed the use of AI scribe software for clinical note transcription with the patient, who gave verbal consent to proceed.  History of Present Illness Jason Salazar is a 78 year old male with hypertension, hyperlipidemia, atrial fibrillation, diabetes, congestive heart failure, seizure disorder, and a history of prostate cancer who presents for follow-up. He is accompanied by his son.  He has his medications with him today.  He recently underwent a tooth extraction and is planning to have a bridge placed. He did not need to hold his Coumadin  for the procedure.  DM: His diabetes management includes Farxiga  10 mg and Tresiba  20 units at night. He uses a continuous glucose monitor, with blood sugars in the target range 63% of the time over the past week and 58% over the past two weeks with no lows. He occasionally consumes sugary drinks and snacks but is trying to adhere to dietary recommendations. He has started walking his dog for exercise. Followed by endo Dr. Lenis whom he saw 05/20/24  HTN/CKD: He checks his blood pressure at home twice a day, though he is unsure of the exact readings. He is on losartan  25 mg (both 50 mg and 25 mg on current list but has 25 mg bottle with him) and spironolactone  25 mg daily. He has not taken his blood pressure medications as yet for today. - Noted to have a bottle of ibuprofen in his med box that he brought with him today.  He tells me that he is not taking it.  For atrial fibrillation, he continues to take Coumadin  and  is followed by the Coumadin  Clinic. His INR was 2.7 at the last check. No bruising or bleeding.  CHF: He has a history of congestive heart failure and takes furosemide  daily. He reports swelling in his legs but no increased shortness of breath or chest pain. He limits his salt intake and sleeps on two pillows.  Regarding his seizure disorder, he is still on Dilantin  300 mg and lacosamide . No recent seizures.  He has a history of prostate cancer and follows up with [facility]. His PSA level was normal.  He plans to return to his urologist through [facility] as he was more comfortable with them.  HM: He is due for a diabetic eye exam and has an upcoming appointment. He believes he received the shingles vaccine from previous PCP but is unsure of the details.    Patient Active Problem List   Diagnosis Date Noted   Long term (current) use of anticoagulants 03/25/2024   PAF (paroxysmal atrial fibrillation) (HCC) 02/04/2024   Chest pressure 02/04/2024   Atypical atrial flutter (HCC) 01/21/2024   Secondary hypercoagulable state (HCC) 01/21/2024   History of syncope 09/24/2023   Bilateral lower extremity edema 09/24/2023   Class 1 obesity due to excess calories with serious comorbidity and body mass index (BMI) of 32.0 to 32.9 in adult 05/30/2023   Long-term current use of injectable noninsulin antidiabetic medication 05/30/2023   Essential hypertension, benign 05/02/2023   Inadequate sleep hygiene 10/30/2021   AV  block, 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   Stage 3a chronic kidney disease (HCC) 10/09/2021   Epilepsy (HCC) 04/19/2019   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     aspirin EC 81 MG tablet Take 81 mg by mouth daily.     b complex vitamins tablet Take 1 tablet by mouth daily.     bimatoprost  (LUMIGAN ) 0.01 % SOLN Instill 1 drop into both eyes at bedtime 7.5 mL 6   bimatoprost  (LUMIGAN ) 0.01 % SOLN Place 1 drop into both eyes every evening. 22.5 mL 3   Cholecalciferol (VITAMIN D3) 1000 units CAPS Take 1,000 Units by mouth 1 day or 1 dose.     [Paused] Colchicine  0.6 MG CAPS Take 1 capsule by mouth 2 (two) times daily as needed for gout flare up 30 capsule 2   colchicine  0.6 MG tablet Take 1.2 mg (2 tablets) today and 0.6 mg (1 tablet) daily for days 2 through 5. 6 tablet 0   Continuous Glucose Sensor (FREESTYLE LIBRE 3 PLUS SENSOR) MISC Change sensor every 15 days. 6 each 1   dapagliflozin  propanediol (FARXIGA ) 10 MG TABS tablet TAKE 1 TABLET BY MOUTH EVERY DAY 90 tablet 1   furosemide  (LASIX ) 20 MG tablet Take 1 tablet (20 mg total) by mouth daily. 90 tablet 3   lacosamide  (VIMPAT ) 50 MG TABS tablet Take 1 tablet (50 mg total) by mouth 2 (two) times daily. 60 tablet 5   Multiple Vitamins-Minerals (ONE-A-DAY MENS 50+ ADVANTAGE PO) Take 1 tablet by mouth daily.     phenytoin  (DILANTIN ) 100 MG ER capsule TAKE 3 CAPSULES BY MOUTH AT BEDTIME. 270 capsule 4   phenytoin  (DILANTIN ) 100 MG ER capsule Take 3 capsules (300 mg total) by mouth at bedtime. 270 capsule 4   rosuvastatin  (CRESTOR ) 20 MG tablet Take 1 tablet (20 mg total) by mouth daily. 90 tablet 1   spironolactone  (ALDACTONE ) 25 MG tablet Take 0.5 tablets (12.5 mg total) by mouth daily. 45 tablet 3   warfarin (COUMADIN ) 5 MG tablet Take 1 tablet (5 mg total) by mouth daily. 30 tablet 11   gabapentin  (NEURONTIN ) 300 MG capsule Take 1 capsule (300 mg total) by  mouth at bedtime. (Patient not taking: Reported on 07/20/2024) 90 capsule 2   lacosamide  (VIMPAT ) 50 MG TABS tablet Take 1 tablet (50 mg total) by mouth 2 (two) times daily. 60 tablet 5   mometasone (NASONEX) 50 MCG/ACT nasal spray 2 sprays as needed.     tiZANidine (ZANAFLEX) 4 MG tablet Take 4 mg by mouth 2 (two) times daily as needed. (Patient not taking: Reported on 07/20/2024)     Vitamin C 250 MG TABS Take 250 mg by mouth 1 day or 1 dose. (Patient not taking: Reported on 07/20/2024)     No current facility-administered medications on file prior to visit.    Allergies  Allergen Reactions   Atorvastatin Other (  See Comments)    Other reaction(s): lightheaded    Social History   Socioeconomic History   Marital status: Married    Spouse name: Almarie   Number of children: 1   Years of education: HS   Highest education level: 12th grade  Occupational History    Employer: [employer]  Tobacco Use   Smoking status: Never   Smokeless tobacco: Never   Tobacco comments:    Never smoked 01/21/24  Vaping Use   Vaping status: Never Used  Substance and Sexual Activity   Alcohol use: No   Drug use: No   Sexual activity: Not on file  Other Topics Concern   Not on file  Social History Narrative   Patient is married to [place] and has one son.   Caffeine Use: none; quit a few months ago             Social Drivers of Corporate investment banker Strain: Low Risk  (06/04/2024)   Overall Financial Resource Strain (CARDIA)    Difficulty of Paying Living Expenses: Not hard at all  Food Insecurity: No Food Insecurity (06/04/2024)   Hunger Vital Sign    Worried About Running Out of Food in the Last Year: Never true    Ran Out of Food in the Last Year: Never true  Transportation Needs: No Transportation Needs (06/04/2024)   PRAPARE - Administrator, Civil Service (Medical): No    Lack of Transportation (Non-Medical): No  Physical Activity: Insufficiently Active  (06/04/2024)   Exercise Vital Sign    Days of Exercise per Week: 1 day    Minutes of Exercise per Session: 10 min  Stress: No Stress Concern Present (06/04/2024)   [employer] of Occupational Health - Occupational Stress Questionnaire    Feeling of Stress: Not at all  Recent Concern: Stress - Stress Concern Present (03/16/2024)   [employer] of Occupational Health - Occupational Stress Questionnaire    Feeling of Stress : To some extent  Social Connections: Socially Integrated (06/04/2024)   Social Connection and Isolation Panel    Frequency of Communication with Friends and Family: More than three times a week    Frequency of Social Gatherings with Friends and Family: Once a week    Attends Religious Services: More than 4 times per year    Active Member of [employer] or Organizations: Yes    Attends Banker Meetings: Never    Marital Status: Married  Catering manager Violence: Not At Risk (05/18/2024)   Humiliation, Afraid, Rape, and Kick questionnaire    Fear of Current or Ex-Partner: No    Emotionally Abused: No    Physically Abused: No    Sexually Abused: No    Family History  Problem Relation Age of Onset   CVA Mother    Sleep apnea Brother    Thyroid cancer Father    Prostate cancer Father     Past Surgical History:  Procedure Laterality Date   cyst removed from right kidney      ROS: Review of Systems Negative except as stated above  PHYSICAL EXAM: BP (!) 150/70   Pulse 69   Temp 98.2 F (36.8 C) (Oral)   Ht 5' 4 (1.626 m)   Wt 179 lb (81.2 kg)   SpO2 98%   BMI 30.73 kg/m   Wt Readings from Last 3 Encounters:  07/20/24 179 lb (81.2 kg)  06/07/24 171 lb (77.6 kg)  06/03/24 171 lb (77.6 kg)  Physical Exam General appearance - alert, well appearing, older AAM and in no distress Mental status - normal mood, behavior, speech, dress, motor activity, and thought processes Ears - bilateral TM's and external ear canals normal Chest - clear  to auscultation, no wheezes, rales or rhonchi, symmetric air entry Heart - sounds to be in sinus rhythm, no M/G heard Extremities - peripheral pulses normal, no pedal edema, no clubbing or cyanosis Diabetic Foot Exam - Simple   Simple Foot Form Diabetic Foot exam was performed with the following findings: Yes 07/20/2024  9:23 AM  Visual Inspection See comments: Yes Sensation Testing See comments: Yes Pulse Check Posterior Tibialis and Dorsalis pulse intact bilaterally: Yes Comments Slight decrease sensation in certain areas plantar surface BL. Dry skin. Some toenails slightly discolored.        Latest Ref Rng & Units 05/19/2024    1:47 PM 10/07/2023   10:18 AM 09/03/2023    8:24 AM  CMP  Glucose 70 - 99 mg/dL 864  80  881   BUN 8 - 27 mg/dL 26  24  25    Creatinine 0.76 - 1.27 mg/dL 8.50  8.71  8.53   Sodium 134 - 144 mmol/L 136  142  137   Potassium 3.5 - 5.2 mmol/L 4.8  4.5  4.4   Chloride 96 - 106 mmol/L 97  102  98   CO2 20 - 29 mmol/L 22  22  24    Calcium  8.6 - 10.2 mg/dL 9.0  9.0  9.0   Total Protein 6.0 - 8.5 g/dL 7.0   6.6   Total Bilirubin 0.0 - 1.2 mg/dL 0.4   0.2   Alkaline Phos 44 - 121 IU/L 135   135   AST 0 - 40 IU/L 16   27   ALT 0 - 44 IU/L 29   46    Lipid Panel     Component Value Date/Time   CHOL 231 (H) 05/19/2024 1347   TRIG 140 05/19/2024 1347   HDL 64 05/19/2024 1347   CHOLHDL 3.6 05/19/2024 1347   CHOLHDL 3.2 03/19/2021 0913   LDLCALC 142 (H) 05/19/2024 1347   LDLCALC 83 03/19/2021 0913    CBC    Component Value Date/Time   WBC 4.6 11/12/2022 1031   RBC 5.30 11/12/2022 1031   HGB 15.1 11/12/2022 1031   HGB 15.4 09/18/2022 0939   HCT 45.5 11/12/2022 1031   HCT 46.5 09/18/2022 0939   PLT 158 11/12/2022 1031   PLT 155 09/18/2022 0939   MCV 85.8 11/12/2022 1031   MCV 84 09/18/2022 0939   MCH 28.5 11/12/2022 1031   MCHC 33.2 11/12/2022 1031   RDW 12.6 11/12/2022 1031   RDW 12.6 09/18/2022 0939   LYMPHSABS 1.3 09/18/2022 0939   MONOABS  0.5 02/19/2014 0843   EOSABS 0.0 09/18/2022 0939   BASOSABS 0.0 09/18/2022 0939    ASSESSMENT AND PLAN: 1. Type 2 diabetes mellitus with obesity (HCC) (Primary) Encouraged him to continue healthy eating habits. His CGM shows average morning blood sugars before breakfast ranging between 140-155 and average at noon around 202.  Advised him to increase the Tresiba  from 20 units daily to 22 units daily.  Continue Farxiga .  Keep his appointment with his endocrinologist in October - insulin  degludec (TRESIBA ) 100 UNIT/ML FlexTouch Pen; Inject 22 Units into the skin at bedtime.  Dispense: 15 mL; Refill: 1  2. Insulin  long-term use (HCC) 3. Diabetes mellitus treated with oral medication (HCC) See #1 above.  4. Hypertension associated with diabetes (HCC) Repeat blood pressure is better but not at goal.  He has not taken his medicines yet for today.  He will continue Cozaar  25 mg daily, spironolactone  12.5 mg daily.  Advised to check blood pressure at least once or twice a week.  Will have him follow-up with the clinical pharmacist in several weeks for recheck.  5. Chronic diastolic congestive heart failure (HCC) Stable and compensated.  Continue spironolactone  12.5 mg daily and furosemide  as needed.  6. Seizure disorder (HCC) Stable on Dilantin  and Vimpat   7. CKD stage 3a, GFR 45-59 ml/min (HCC) GFR ranging between 58-48. Advised not to take any NSAIDs and to throw away the ibuprofen that he has in his med box.  8. History of prostate cancer Patient has seen [facility] but states that he plans to go back to his urologist through [facility] provided they accept his insurance.  Assessment and Plan Assessment & Plan Type 2 diabetes mellitus Suboptimal control with A1c at 7.8%. Continuous glucose monitor shows 63% time in target range. Weight gain likely due to insulin . - Increase Tresiba  to 22 units at night. - Advise to reduce sugary drinks and snacks. - Encourage regular exercise with his  dog. - Monitor blood sugars and adjust Tresiba  if morning glucose is less than 90 mg/dL.  Hypertension Blood pressure at 150/70 mmHg. Home readings around 140 mmHg. On losartan  and spironolactone . - Continue current antihypertensive regimen. - Recheck blood pressure in one month with clinical pharmacist. - Bring home blood pressure readings to next appointment.  Congestive heart failure Leg swelling reported. No increased shortness of breath or chest pain. On furosemide , losartan , and spironolactone . - Continue current medications. - Limit salt intake.  Atrial fibrillation and atrial flutter Managed with Coumadin , INR at 2.7. Considering Watchman procedure. No recent bruising or bleeding. - Continue Coumadin  and follow with Coumadin  Clinic. - Discuss Watchman procedure further with cardiologist.  Chronic kidney disease Suboptimal kidney function. Advised against ibuprofen use. - Avoid ibuprofen to prevent further kidney damage.  Seizure disorder Managed with Dilantin  and lacosamide . No recent seizures. Plan to transition off Dilantin  not yet implemented. - Continue current seizure medications.  History of prostate cancer Recent normal PSA level. Prefers follow-up at [facility]. - Contact [facility] for follow-up appointment.     There are no diagnoses linked to this encounter.   Patient was given the opportunity to ask questions.  Patient verbalized understanding of the plan and was able to repeat key elements of the plan.   This documentation was completed using Paediatric nurse.  Any transcriptional errors are unintentional.  No orders of the defined types were placed in this encounter.    Requested Prescriptions   Signed Prescriptions Disp Refills   losartan  (COZAAR ) 25 MG tablet 90 tablet 1    Sig: Take 1 tablet (25 mg total) by mouth daily.   insulin  degludec (TRESIBA ) 100 UNIT/ML FlexTouch Pen 15 mL 1    Sig: Inject 22 Units into the  skin at bedtime.    Return in about 4 months (around 11/19/2024) for Appt with [facility] in 2 wks for BP check.  Barnie Louder, MD, FACP

## 2024-07-20 NOTE — Patient Instructions (Signed)
 VISIT SUMMARY:  Today, you came in for a follow-up appointment to discuss your ongoing health conditions, including diabetes, hypertension, heart failure, atrial fibrillation, seizure disorder, and a history of prostate cancer. You were accompanied by your son. We reviewed your recent tooth extraction, your current medications, and your recent health monitoring results.  YOUR PLAN:  -TYPE 2 DIABETES MELLITUS: Your blood sugar control is not optimal, with an A1c of 7.8%. We will increase your Tresiba  dose to 22 units at night. Please try to reduce sugary drinks and snacks, continue exercising with your dog, and monitor your blood sugars. Adjust your Tresiba  dose if your morning glucose is less than 90 mg/dL.  -HYPERTENSION: Your blood pressure is currently at 150/70 mmHg, with home readings around 140 mmHg. Continue taking your current medications, losartan  and spironolactone . Please recheck your blood pressure in one month with the clinical pharmacist and bring your home readings to your next appointment.  -CONGESTIVE HEART FAILURE: You have reported swelling in your legs but no increased shortness of breath or chest pain. Continue taking your current medications and limit your salt intake to help manage your symptoms.  -ATRIAL FIBRILLATION AND ATRIAL FLUTTER: Your atrial fibrillation is being managed with Coumadin , and your INR is at 2.7. We will continue with Coumadin  and follow up with the Coumadin  Clinic. We will also discuss the possibility of a Watchman procedure with your cardiologist.  -CHRONIC KIDNEY DISEASE: Your kidney function is not optimal. To prevent further damage, avoid using ibuprofen.  -SEIZURE DISORDER: Your seizures are currently managed with Dilantin  and lacosamide , and you have not had any recent seizures. Continue with your current medications.  -HISTORY OF PROSTATE CANCER: Your recent PSA level was normal. We will contact [facility] to schedule your follow-up  appointment.  INSTRUCTIONS:  Please recheck your blood pressure in one month with the clinical pharmacist and bring your home readings to your next appointment. Continue monitoring your blood sugars and adjust your Tresiba  dose if your morning glucose is less than 90 mg/dL. We will contact [facility] to schedule your follow-up appointment for your prostate cancer history.

## 2024-07-21 ENCOUNTER — Other Ambulatory Visit (HOSPITAL_BASED_OUTPATIENT_CLINIC_OR_DEPARTMENT_OTHER): Payer: Self-pay | Admitting: Pharmacist

## 2024-07-21 DIAGNOSIS — I10 Essential (primary) hypertension: Secondary | ICD-10-CM

## 2024-07-21 NOTE — Progress Notes (Signed)
 Pharmacy Quality Measure Review  This patient is appearing on a report for the adherence measure for hypertension (ACEi/ARB) medications this calendar year.   Medication: losartan  Last fill date: 07/20/2024 for 90 day supply  Reminder set for next refill date.   Herlene Fleeta Morris, PharmD, JAQUELINE, CPP Clinical Pharmacist [facility] [phone]

## 2024-07-27 NOTE — Progress Notes (Signed)
 Carelink Summary Report / Loop Recorder

## 2024-07-29 ENCOUNTER — Ambulatory Visit (INDEPENDENT_AMBULATORY_CARE_PROVIDER_SITE_OTHER): Payer: Self-pay

## 2024-07-29 ENCOUNTER — Other Ambulatory Visit (HOSPITAL_BASED_OUTPATIENT_CLINIC_OR_DEPARTMENT_OTHER): Payer: Self-pay | Admitting: Pharmacist

## 2024-07-29 DIAGNOSIS — R55 Syncope and collapse: Secondary | ICD-10-CM | POA: Diagnosis not present

## 2024-07-29 DIAGNOSIS — I10 Essential (primary) hypertension: Secondary | ICD-10-CM

## 2024-07-29 LAB — CUP PACEART REMOTE DEVICE CHECK
Date Time Interrogation Session: 20250820233941
Implantable Pulse Generator Implant Date: 20231128

## 2024-07-29 NOTE — Progress Notes (Signed)
 Pharmacy Quality Measure Review  This patient is appearing on a report for the adherence measure for hypertension (ACEi/ARB) medications this calendar year.   Medication: losartan  Last fill date: 07/21/2024 for 90 day supply  Reminder set for next refill date.   Jason Salazar, PharmD, JAQUELINE, CPP Clinical Pharmacist [facility] [phone]

## 2024-07-30 ENCOUNTER — Other Ambulatory Visit: Payer: Self-pay

## 2024-07-30 ENCOUNTER — Telehealth: Payer: Self-pay

## 2024-07-30 ENCOUNTER — Other Ambulatory Visit (HOSPITAL_BASED_OUTPATIENT_CLINIC_OR_DEPARTMENT_OTHER): Payer: Self-pay

## 2024-07-30 MED ORDER — WARFARIN SODIUM 5 MG PO TABS: 5.0000 mg | ORAL_TABLET | Freq: Every day | ORAL | 11 refills | Status: AC

## 2024-07-30 NOTE — Telephone Encounter (Signed)
 Alert remote transmission:  Symptom 1 new symptom activation 8/21 @ 16:59, EGM c/w likely Mobitz I - Known hx   Patient says he became really lethargic and thinks he actually blacked out at his desk when this happened. He woke up on his desk and pressed his symptom activator.   Patient given ER precautions for severe symptoms and device clinic number to call if any future sx events.  Forwarding to Dr. Nancey to discuss next steps for patient.

## 2024-07-30 NOTE — Telephone Encounter (Signed)
 Patient appointment made with Dr. Nancey on 8/27 at 2pm to review events.  Also, instructed patient not to drive in the interim until his appt given recent event.  Patient states that he does not drive so will not be a problem and agrees to come see us  next week.

## 2024-08-02 ENCOUNTER — Ambulatory Visit: Payer: Self-pay | Admitting: Cardiovascular Disease

## 2024-08-04 ENCOUNTER — Ambulatory Visit: Attending: Cardiovascular Disease | Admitting: Cardiovascular Disease

## 2024-08-04 ENCOUNTER — Encounter: Payer: Self-pay | Admitting: Cardiovascular Disease

## 2024-08-04 VITALS — BP 120/70 | HR 74 | Ht 64.0 in | Wt 173.0 lb

## 2024-08-04 DIAGNOSIS — R55 Syncope and collapse: Secondary | ICD-10-CM | POA: Diagnosis not present

## 2024-08-04 DIAGNOSIS — I441 Atrioventricular block, second degree: Secondary | ICD-10-CM | POA: Diagnosis not present

## 2024-08-04 DIAGNOSIS — I480 Paroxysmal atrial fibrillation: Secondary | ICD-10-CM

## 2024-08-04 DIAGNOSIS — I484 Atypical atrial flutter: Secondary | ICD-10-CM | POA: Diagnosis not present

## 2024-08-04 NOTE — Patient Instructions (Signed)
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
## 2024-08-05 ENCOUNTER — Ambulatory Visit: Attending: Cardiology | Admitting: Pharmacist

## 2024-08-05 DIAGNOSIS — I480 Paroxysmal atrial fibrillation: Secondary | ICD-10-CM

## 2024-08-05 DIAGNOSIS — Z5181 Encounter for therapeutic drug level monitoring: Secondary | ICD-10-CM | POA: Diagnosis not present

## 2024-08-05 DIAGNOSIS — Z7901 Long term (current) use of anticoagulants: Secondary | ICD-10-CM | POA: Diagnosis not present

## 2024-08-05 LAB — POCT INR: INR: 2.3 (ref 2.0–3.0)

## 2024-08-05 NOTE — Progress Notes (Signed)
 INR 2.3; Please see anticoagulation encounter   Description   Continue taking 1/2 tablet daily EXCEPT 1 tablet on Mondays, Wednesdays, and Fridays.  Recheck INR in 5 weeks. Remain consistent with greens each week (1-2 servings per week)  Cardiac Clearance Fax #[phone] Coumadin  Clinic [phone] Pending Watchman device  *On Dilantin *

## 2024-08-05 NOTE — Patient Instructions (Signed)
 Description   Continue taking 1/2 tablet daily EXCEPT 1 tablet on Mondays, Wednesdays, and Fridays.  Recheck INR in 5 weeks. Remain consistent with greens each week (1-2 servings per week)  Cardiac Clearance Fax #[phone] Coumadin  Clinic [phone] Pending Watchman device  *On Dilantin *

## 2024-08-18 DIAGNOSIS — G4733 Obstructive sleep apnea (adult) (pediatric): Secondary | ICD-10-CM | POA: Diagnosis not present

## 2024-08-24 DIAGNOSIS — H04123 Dry eye syndrome of bilateral lacrimal glands: Secondary | ICD-10-CM | POA: Diagnosis not present

## 2024-08-24 DIAGNOSIS — H401131 Primary open-angle glaucoma, bilateral, mild stage: Secondary | ICD-10-CM | POA: Diagnosis not present

## 2024-08-24 DIAGNOSIS — H35033 Hypertensive retinopathy, bilateral: Secondary | ICD-10-CM | POA: Diagnosis not present

## 2024-08-24 DIAGNOSIS — E119 Type 2 diabetes mellitus without complications: Secondary | ICD-10-CM | POA: Diagnosis not present

## 2024-08-27 ENCOUNTER — Telehealth: Payer: Self-pay

## 2024-08-27 DIAGNOSIS — G4733 Obstructive sleep apnea (adult) (pediatric): Secondary | ICD-10-CM

## 2024-08-27 NOTE — Telephone Encounter (Signed)
 Spoke with patient's wife.  [facility] has sent a bill for CPAP supplies.  Supplies have already been sent and received by the patient.  [facility] just needs an order sent to them for the supplies so they can bill patients insurance.  Will send an order for CPAP supplies to [facility].

## 2024-08-27 NOTE — Telephone Encounter (Signed)
 Copied from CRM [id number]. Topic: Clinical - Order For Equipment >> Aug 27, 2024 11:27 AM Joesph PARAS wrote: Reason for CRM: Patient's spouse calling to request an order for CPAP supplies be sent to both [facility] and to [facility], as they had to call in supplies and received a bill for it. [employer] states they will cover the bill when they receive an order for the supplies.

## 2024-08-30 ENCOUNTER — Ambulatory Visit (INDEPENDENT_AMBULATORY_CARE_PROVIDER_SITE_OTHER): Payer: Self-pay

## 2024-08-30 DIAGNOSIS — I480 Paroxysmal atrial fibrillation: Secondary | ICD-10-CM

## 2024-08-30 NOTE — Progress Notes (Deleted)
 New Patient Office Visit  Subjective    Patient ID: Jason Salazar, male    DOB: Aug 11, 1946  Age: 78 y.o. MRN: [id number]  CC: No chief complaint on file.   HPI Jason Salazar presents to establish care ***  Outpatient Encounter Medications as of 08/31/2024  Medication Sig   allopurinol  (ZYLOPRIM ) 100 MG tablet Take 100 mg by mouth 2 (two) times daily.   aspirin EC 81 MG tablet Take 81 mg by mouth daily.   b complex vitamins tablet Take 1 tablet by mouth daily.   bimatoprost  (LUMIGAN ) 0.01 % SOLN Instill 1 drop into both eyes at bedtime   bimatoprost  (LUMIGAN ) 0.01 % SOLN Place 1 drop into both eyes every evening.   Cholecalciferol (VITAMIN D3) 1000 units CAPS Take 1,000 Units by mouth 1 day or 1 dose.   [Paused] Colchicine  0.6 MG CAPS Take 1 capsule by mouth 2 (two) times daily as needed for gout flare up   colchicine  0.6 MG tablet Take 1.2 mg (2 tablets) today and 0.6 mg (1 tablet) daily for days 2 through 5. (Patient not taking: Reported on 08/04/2024)   Continuous Glucose Sensor (FREESTYLE LIBRE 3 PLUS SENSOR) MISC Change sensor every 15 days.   dapagliflozin  propanediol (FARXIGA ) 10 MG TABS tablet TAKE 1 TABLET BY MOUTH EVERY DAY   furosemide  (LASIX ) 20 MG tablet Take 1 tablet (20 mg total) by mouth daily.   gabapentin  (NEURONTIN ) 300 MG capsule Take 1 capsule (300 mg total) by mouth at bedtime. (Patient not taking: Reported on 08/04/2024)   insulin  degludec (TRESIBA ) 100 UNIT/ML FlexTouch Pen Inject 22 Units into the skin at bedtime.   lacosamide  (VIMPAT ) 50 MG TABS tablet Take 1 tablet (50 mg total) by mouth 2 (two) times daily.   lacosamide  (VIMPAT ) 50 MG TABS tablet Take 1 tablet (50 mg total) by mouth 2 (two) times daily. (Patient not taking: Reported on 08/04/2024)   losartan  (COZAAR ) 25 MG tablet Take 1 tablet (25 mg total) by mouth daily.   mometasone (NASONEX) 50 MCG/ACT nasal spray 2 sprays as needed.   Multiple Vitamins-Minerals (ONE-A-DAY MENS 50+ ADVANTAGE PO) Take  1 tablet by mouth daily.   phenytoin  (DILANTIN ) 100 MG ER capsule TAKE 3 CAPSULES BY MOUTH AT BEDTIME.   phenytoin  (DILANTIN ) 100 MG ER capsule Take 3 capsules (300 mg total) by mouth at bedtime. (Patient not taking: Reported on 08/04/2024)   rosuvastatin  (CRESTOR ) 20 MG tablet Take 1 tablet (20 mg total) by mouth daily.   spironolactone  (ALDACTONE ) 25 MG tablet Take 0.5 tablets (12.5 mg total) by mouth daily.   tiZANidine (ZANAFLEX) 4 MG tablet Take 4 mg by mouth 2 (two) times daily as needed.   Vitamin C 250 MG TABS Take 250 mg by mouth 1 day or 1 dose.   warfarin (COUMADIN ) 5 MG tablet Take 1 tablet (5 mg total) by mouth daily.   No facility-administered encounter medications on file as of 08/31/2024.    Past Medical History:  Diagnosis Date   Allergic rhinitis    Balantidiasis    Cancer [facility])    prostate cancer treated 2007 with radiation seed placement   Diabetes (HCC)    H/O prostate cancer    Radioactive seeds   Hypercholesteremia    Hyperlipidemia    Hyperlipoproteinemia    Hypertension    Sleep apnea     Past Surgical History:  Procedure Laterality Date   cyst removed from right kidney      Family History  Problem Relation  Age of Onset   CVA Mother    Sleep apnea Brother    Thyroid  cancer Father    Prostate cancer Father     Social History   Socioeconomic History   Marital status: Married    Spouse name: Almarie   Number of children: 1   Years of education: HS   Highest education level: 12th grade  Occupational History    Employer: [employer]  Tobacco Use   Smoking status: Never   Smokeless tobacco: Never   Tobacco comments:    Never smoked 01/21/24  Vaping Use   Vaping status: Never Used  Substance and Sexual Activity   Alcohol use: No   Drug use: No   Sexual activity: Not on file  Other Topics Concern   Not on file  Social History Narrative   Patient is married to [place] and has one son.   Caffeine Use: none; quit a few months ago              Social Drivers of Corporate investment banker Strain: Low Risk  (06/04/2024)   Overall Financial Resource Strain (CARDIA)    Difficulty of Paying Living Expenses: Not hard at all  Food Insecurity: No Food Insecurity (06/04/2024)   Hunger Vital Sign    Worried About Running Out of Food in the Last Year: Never true    Ran Out of Food in the Last Year: Never true  Transportation Needs: No Transportation Needs (06/04/2024)   PRAPARE - Administrator, Civil Service (Medical): No    Lack of Transportation (Non-Medical): No  Physical Activity: Insufficiently Active (06/04/2024)   Exercise Vital Sign    Days of Exercise per Week: 1 day    Minutes of Exercise per Session: 10 min  Stress: No Stress Concern Present (06/04/2024)   [employer] of Occupational Health - Occupational Stress Questionnaire    Feeling of Stress: Not at all  Recent Concern: Stress - Stress Concern Present (03/16/2024)   [employer] of Occupational Health - Occupational Stress Questionnaire    Feeling of Stress : To some extent  Social Connections: Socially Integrated (06/04/2024)   Social Connection and Isolation Panel    Frequency of Communication with Friends and Family: More than three times a week    Frequency of Social Gatherings with Friends and Family: Once a week    Attends Religious Services: More than 4 times per year    Active Member of [employer] or Organizations: Yes    Attends Banker Meetings: Never    Marital Status: Married  Catering manager Violence: Not At Risk (05/18/2024)   Humiliation, Afraid, Rape, and Kick questionnaire    Fear of Current or Ex-Partner: No    Emotionally Abused: No    Physically Abused: No    Sexually Abused: No    ROS      Objective    There were no vitals taken for this visit.  Physical Exam  {Labs (Optional):23779}    Assessment & Plan:   Problem List Items Addressed This Visit   None   No follow-ups on file.    Jason Salazar, Student-PharmD

## 2024-08-30 NOTE — Progress Notes (Unsigned)
 S:     No chief complaint on file.  78 y.o. male who presents for hypertension evaluation, education, and management.   Patient was referred and last seen by Primary Care Provider, Dr. Vicci, on 07/20/2024. At this visit, patient's BP was 150/70 mmHg and they reported checking their BP twice daily at home (unsure of the readings).   PMH is significant for HTN, T2DM, CKD 3a, HLD, atrial flutter/PAF/Mobitz 1, HFpEF (last ECHO from 12/12/22: LVEF 60-65%), OSA on CPAP, syncope, seizure disorder, hx of prostate cancer, gout, and obesity.   Today, patient arrives in good spirits and presents without needing assistance. Denies dizziness, headache, blurred vision, swelling.   Family/Social history: No tobacco/alcohol use reported.   Mother had a CVA, father had thyroid  and prostate cancer.   Medication adherence is optimal. Patient brought in medications with him today, I went through them and confirmed everything. Patient did not report taking blood pressure medications today.   Current antihypertensives include: losartan  25 mg daily, spironolactone  12.5 mg (takes 0.5 tablets of spironolactone  25 mg)  Antihypertensives tried in the past include: lisinopril -hydrochlorothiazide  20-12.5 mg daily, lisinopril  20 mg daily, clonidine 0.1 mg daily   Reported home blood pressure readings: Does not check BP at home, patient is interested in obtaining a BP cuff.   Patient reported dietary habits: Has about 1 cup of coffee, has limited sodium intake.   Patient-reported exercise habits: Walks dog.   ASCVD risk factors include: HTN, HLD, T2DM, obesity, family Hx (mother had a stroke)  O:  Vitals:   08/31/24 1221  BP: 130/68     Last 3 Office BP readings: BP Readings from Last 3 Encounters:  08/31/24 130/68  08/04/24 120/70  07/20/24 (!) 150/70    BMET    Component Value Date/Time   NA 136 05/19/2024 1347   K 4.8 05/19/2024 1347   CL 97 05/19/2024 1347   CO2 22 05/19/2024 1347    GLUCOSE 135 (H) 05/19/2024 1347   GLUCOSE 192 (H) 11/12/2022 1031   BUN 26 05/19/2024 1347   CREATININE 1.49 (H) 05/19/2024 1347   CREATININE 1.32 (H) 11/12/2022 1031   CALCIUM  9.0 05/19/2024 1347   GFRNONAA 46 (L) 03/19/2021 0913   GFRAA 53 (L) 03/19/2021 0913    Renal function: CrCl cannot be calculated (Patient's most recent lab result is older than the maximum 21 days allowed.).  Clinical ASCVD: No  The 10-year ASCVD risk score (Arnett DK, et al., 2019) is: 38.8%   Values used to calculate the score:     Age: 62 years     Clincally relevant sex: Male     Is Non-Hispanic African American: Yes     Diabetic: Yes     Tobacco smoker: No     Systolic Blood Pressure: 130 mmHg     Is BP treated: Yes     HDL Cholesterol: 64 mg/dL     Total Cholesterol: 231 mg/dL  Patient is participating in a Managed Medicaid Plan:  No   A/P: Hypertension longstanding, currently controlled on current medications with BP today of 130/68 mmHg, (goal < 130/80 mmHg) despite not taking their BP medications today. Medication adherence and BP control appears optimal. We will make no changes today and encouraged patient to maintain regular follow-ups with PCP. Patient can see pharmacy on an as-needed basis.  -Continued losartan  25 mg daily.  -Continued spironolactone  12.5 mg daily.  -Sent rx for at-home BP monitor  -Patient educated on purpose, proper use, and potential adverse  effects of current hypertension medications.   -F/u labs ordered - lipid panel, BMET - per Cardiologist, Jackee Alberts NP, Cc'ed them on order  -Counseled on lifestyle modifications for blood pressure control including reduced dietary sodium, increased exercise, adequate sleep.  Results reviewed and written information provided.    Written patient instructions provided. Patient verbalized understanding of treatment plan.  Total time in face to face counseling 30 minutes.    Follow-up:  Pharmacist PRN PCP clinic visit in 11/19/24    Patient seen with Tinnie Norrie, PharmD Candidate   Herlene Fleeta Morris, PharmD, BCACP, CPP Clinical Pharmacist [facility] [phone]

## 2024-08-31 ENCOUNTER — Encounter: Payer: Self-pay | Admitting: Pharmacist

## 2024-08-31 ENCOUNTER — Other Ambulatory Visit: Payer: Self-pay

## 2024-08-31 ENCOUNTER — Ambulatory Visit: Attending: Internal Medicine | Admitting: Pharmacist

## 2024-08-31 VITALS — BP 130/68

## 2024-08-31 DIAGNOSIS — I10 Essential (primary) hypertension: Secondary | ICD-10-CM

## 2024-08-31 MED ORDER — BLOOD PRESSURE CUFF MISC
0 refills | Status: AC
  Filled 2024-08-31: qty 1, fill #0

## 2024-08-31 NOTE — Progress Notes (Signed)
 Remote Loop Recorder Transmission

## 2024-09-01 ENCOUNTER — Ambulatory Visit: Payer: Self-pay | Admitting: Nurse Practitioner

## 2024-09-01 ENCOUNTER — Other Ambulatory Visit: Payer: Self-pay

## 2024-09-01 ENCOUNTER — Telehealth: Payer: Self-pay | Admitting: Internal Medicine

## 2024-09-01 DIAGNOSIS — E7800 Pure hypercholesterolemia, unspecified: Secondary | ICD-10-CM

## 2024-09-01 LAB — BASIC METABOLIC PANEL WITH GFR
BUN/Creatinine Ratio: 17 (ref 10–24)
BUN: 28 mg/dL — ABNORMAL HIGH (ref 8–27)
CO2: 23 mmol/L (ref 20–29)
Calcium: 9.1 mg/dL (ref 8.6–10.2)
Chloride: 102 mmol/L (ref 96–106)
Creatinine, Ser: 1.62 mg/dL — ABNORMAL HIGH (ref 0.76–1.27)
Glucose: 160 mg/dL — ABNORMAL HIGH (ref 70–99)
Potassium: 5.2 mmol/L (ref 3.5–5.2)
Sodium: 139 mmol/L (ref 134–144)
eGFR: 44 mL/min/1.73 — ABNORMAL LOW (ref >59)

## 2024-09-01 LAB — HEPATIC FUNCTION PANEL
ALT: 26 IU/L (ref 0–44)
AST: 18 IU/L (ref 0–40)
Albumin: 4.2 g/dL (ref 3.8–4.8)
Alkaline Phosphatase: 140 IU/L — ABNORMAL HIGH (ref 47–123)
Bilirubin Total: 0.2 mg/dL (ref 0.0–1.2)
Bilirubin, Direct: 0.12 mg/dL (ref 0.00–0.40)
Total Protein: 7 g/dL (ref 6.0–8.5)

## 2024-09-01 LAB — LIPID PANEL
Chol/HDL Ratio: 3.4 ratio (ref 0.0–5.0)
Cholesterol, Total: 208 mg/dL — ABNORMAL HIGH (ref 100–199)
HDL: 61 mg/dL (ref >39)
LDL Chol Calc (NIH): 116 mg/dL — ABNORMAL HIGH (ref 0–99)
Triglycerides: 179 mg/dL — ABNORMAL HIGH (ref 0–149)
VLDL Cholesterol Cal: 31 mg/dL (ref 5–40)

## 2024-09-01 NOTE — Telephone Encounter (Signed)
 Left detailed message asking the patient if he is currently taking Crestor ? Requested a call back or mychart message with the response.

## 2024-09-01 NOTE — Telephone Encounter (Signed)
-----   Message from Wyn Raddle, Jackee Shove sent at 09/01/2024 12:52 PM EDT ----- Regarding: FW: Lab results Tee,  Can you please reach out to Mr. Paulding to verify if he has been taking his Crestor  daily as prescribed.  Please also order a GGT level to determine if elevation is due to liver issue.  Thanks, Jackee Wyn, NP ----- Message ----- From: Vicci Barnie NOVAK, MD Sent: 09/01/2024   9:10 AM EDT To: Jackee VEAR Wyn Raddle., NP Subject: Lab results                                    I saw the lab results and your result message that you cc me on. Before stopping Crestor , you may want to confirm that this pt was taking the Crestor  consistently over the past 1-2 mths. May also have the lab add a GGT level which if elevated will indicated the elevation is due to liver issue. If normal, suggest elev Alk phos is from other issue ie bone.

## 2024-09-01 NOTE — Telephone Encounter (Signed)
-----   Message from Wyn Raddle, Jackee Shove sent at 09/01/2024 12:51 PM EDT ----- Regarding: RE: Lab results Thanks Dr. Vicci I will contact him to verify that he is taking it as prescribed.  I will also add the GGT level per your request. Thanks again, Jackee Wyn, NP ----- Message ----- From: Vicci Barnie NOVAK, MD Sent: 09/01/2024   9:10 AM EDT To: Jackee VEAR Wyn Raddle., NP Subject: Lab results                                    I saw the lab results and your result message that you cc me on. Before stopping Crestor , you may want to confirm that this pt was taking the Crestor  consistently over the past 1-2 mths. May also have the lab add a GGT level which if elevated will indicated the elevation is due to liver issue. If normal, suggest elev Alk phos is from other issue ie bone.

## 2024-09-03 ENCOUNTER — Other Ambulatory Visit (HOSPITAL_BASED_OUTPATIENT_CLINIC_OR_DEPARTMENT_OTHER): Payer: Self-pay

## 2024-09-03 DIAGNOSIS — E7800 Pure hypercholesterolemia, unspecified: Secondary | ICD-10-CM | POA: Diagnosis not present

## 2024-09-04 ENCOUNTER — Other Ambulatory Visit (HOSPITAL_BASED_OUTPATIENT_CLINIC_OR_DEPARTMENT_OTHER): Payer: Self-pay

## 2024-09-04 LAB — GAMMA GT: GGT: 268 IU/L — ABNORMAL HIGH (ref 0–65)

## 2024-09-05 ENCOUNTER — Ambulatory Visit: Payer: Self-pay | Admitting: Nurse Practitioner

## 2024-09-05 DIAGNOSIS — E7800 Pure hypercholesterolemia, unspecified: Secondary | ICD-10-CM

## 2024-09-06 ENCOUNTER — Other Ambulatory Visit (HOSPITAL_BASED_OUTPATIENT_CLINIC_OR_DEPARTMENT_OTHER): Payer: Self-pay

## 2024-09-06 ENCOUNTER — Other Ambulatory Visit: Payer: Self-pay

## 2024-09-06 MED ORDER — FLUZONE HIGH-DOSE 0.5 ML IM SUSY
0.5000 mL | PREFILLED_SYRINGE | Freq: Once | INTRAMUSCULAR | 0 refills | Status: AC
Start: 2024-09-06 — End: 2024-09-07
  Filled 2024-09-06: qty 0.5, 1d supply, fill #0

## 2024-09-07 LAB — CUP PACEART REMOTE DEVICE CHECK
Date Time Interrogation Session: 20250921232418
Implantable Pulse Generator Implant Date: 20231128

## 2024-09-08 NOTE — Progress Notes (Signed)
 Remote Loop Recorder Transmission

## 2024-09-09 ENCOUNTER — Ambulatory Visit: Attending: Cardiology

## 2024-09-09 DIAGNOSIS — I480 Paroxysmal atrial fibrillation: Secondary | ICD-10-CM

## 2024-09-09 DIAGNOSIS — Z7901 Long term (current) use of anticoagulants: Secondary | ICD-10-CM | POA: Diagnosis not present

## 2024-09-09 DIAGNOSIS — Z5181 Encounter for therapeutic drug level monitoring: Secondary | ICD-10-CM | POA: Diagnosis not present

## 2024-09-09 LAB — POCT INR: INR: 1.8 — AB (ref 2.0–3.0)

## 2024-09-09 NOTE — Patient Instructions (Signed)
 Continue taking 1/2 tablet daily EXCEPT 1 tablet on Mondays, Wednesdays, and Fridays.  Recheck INR in 4 weeks. Remain consistent with greens each week (1-2 servings per week)  Cardiac Clearance Fax #[phone] Coumadin  Clinic [phone] Pending Watchman device  *On Dilantin *

## 2024-09-09 NOTE — Progress Notes (Signed)
 INR 1.8 Please see anticoagulation encounter Continue taking 1/2 tablet daily EXCEPT 1 tablet on Mondays, Wednesdays, and Fridays.  Recheck INR in 4 weeks. Remain consistent with greens each week (1-2 servings per week)  Cardiac Clearance Fax #[phone] Coumadin  Clinic [phone] Pending Watchman device  *On Dilantin *

## 2024-09-13 ENCOUNTER — Other Ambulatory Visit (HOSPITAL_BASED_OUTPATIENT_CLINIC_OR_DEPARTMENT_OTHER): Payer: Self-pay

## 2024-09-13 ENCOUNTER — Ambulatory Visit: Payer: Medicare Other

## 2024-09-13 ENCOUNTER — Ambulatory Visit: Admitting: Diagnostic Neuroimaging

## 2024-09-13 ENCOUNTER — Encounter: Payer: Self-pay | Admitting: Diagnostic Neuroimaging

## 2024-09-13 VITALS — BP 144/79 | HR 80 | Ht 63.0 in | Wt 181.0 lb

## 2024-09-13 DIAGNOSIS — M25559 Pain in unspecified hip: Secondary | ICD-10-CM | POA: Insufficient documentation

## 2024-09-13 DIAGNOSIS — J309 Allergic rhinitis, unspecified: Secondary | ICD-10-CM | POA: Insufficient documentation

## 2024-09-13 DIAGNOSIS — E119 Type 2 diabetes mellitus without complications: Secondary | ICD-10-CM | POA: Insufficient documentation

## 2024-09-13 DIAGNOSIS — I10 Essential (primary) hypertension: Secondary | ICD-10-CM | POA: Insufficient documentation

## 2024-09-13 DIAGNOSIS — G40309 Generalized idiopathic epilepsy and epileptic syndromes, not intractable, without status epilepticus: Secondary | ICD-10-CM

## 2024-09-13 DIAGNOSIS — E785 Hyperlipidemia, unspecified: Secondary | ICD-10-CM | POA: Insufficient documentation

## 2024-09-13 MED ORDER — DILANTIN 100 MG PO CAPS
300.0000 mg | ORAL_CAPSULE | Freq: Every day | ORAL | 12 refills | Status: AC
  Filled 2024-09-13 – 2024-11-23 (×2): qty 90, 30d supply, fill #0
  Filled 2024-12-29: qty 90, 30d supply, fill #1

## 2024-09-13 MED ORDER — LACOSAMIDE 50 MG PO TABS
50.0000 mg | ORAL_TABLET | Freq: Two times a day (BID) | ORAL | 5 refills | Status: DC
  Filled 2024-09-13: qty 60, 30d supply, fill #0
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
## 2024-09-13 NOTE — Progress Notes (Signed)
 Remote Loop Recorder Transmission
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
## 2024-09-14 ENCOUNTER — Ambulatory Visit: Payer: Self-pay | Admitting: Cardiovascular Disease

## 2024-09-14 LAB — PHENYTOIN LEVEL, TOTAL: Phenytoin (Dilantin), Serum: 16.8 ug/mL (ref 10.0–20.0)

## 2024-09-15 ENCOUNTER — Ambulatory Visit: Payer: Self-pay | Admitting: Diagnostic Neuroimaging

## 2024-09-20 ENCOUNTER — Telehealth: Payer: Self-pay

## 2024-09-20 ENCOUNTER — Observation Stay (HOSPITAL_COMMUNITY)

## 2024-09-20 ENCOUNTER — Other Ambulatory Visit: Payer: Self-pay

## 2024-09-20 ENCOUNTER — Telehealth: Payer: Self-pay | Admitting: Pharmacy Technician

## 2024-09-20 ENCOUNTER — Other Ambulatory Visit (HOSPITAL_COMMUNITY): Payer: Self-pay

## 2024-09-20 ENCOUNTER — Encounter (HOSPITAL_COMMUNITY): Payer: Self-pay

## 2024-09-20 ENCOUNTER — Ambulatory Visit: Attending: Cardiology

## 2024-09-20 ENCOUNTER — Emergency Department (HOSPITAL_COMMUNITY)

## 2024-09-20 ENCOUNTER — Observation Stay (HOSPITAL_COMMUNITY)
Admission: EM | Admit: 2024-09-20 | Discharge: 2024-09-21 | Disposition: A | Attending: Internal Medicine | Admitting: Internal Medicine

## 2024-09-20 DIAGNOSIS — E782 Mixed hyperlipidemia: Secondary | ICD-10-CM

## 2024-09-20 DIAGNOSIS — Z8673 Personal history of transient ischemic attack (TIA), and cerebral infarction without residual deficits: Secondary | ICD-10-CM | POA: Diagnosis not present

## 2024-09-20 DIAGNOSIS — Z7901 Long term (current) use of anticoagulants: Secondary | ICD-10-CM | POA: Diagnosis not present

## 2024-09-20 DIAGNOSIS — I130 Hypertensive heart and chronic kidney disease with heart failure and stage 1 through stage 4 chronic kidney disease, or unspecified chronic kidney disease: Secondary | ICD-10-CM | POA: Insufficient documentation

## 2024-09-20 DIAGNOSIS — G4733 Obstructive sleep apnea (adult) (pediatric): Secondary | ICD-10-CM | POA: Diagnosis present

## 2024-09-20 DIAGNOSIS — N1831 Chronic kidney disease, stage 3a: Secondary | ICD-10-CM | POA: Diagnosis not present

## 2024-09-20 DIAGNOSIS — Z8546 Personal history of malignant neoplasm of prostate: Secondary | ICD-10-CM | POA: Diagnosis not present

## 2024-09-20 DIAGNOSIS — R200 Anesthesia of skin: Secondary | ICD-10-CM

## 2024-09-20 DIAGNOSIS — G459 Transient cerebral ischemic attack, unspecified: Principal | ICD-10-CM | POA: Insufficient documentation

## 2024-09-20 DIAGNOSIS — G40909 Epilepsy, unspecified, not intractable, without status epilepticus: Secondary | ICD-10-CM

## 2024-09-20 DIAGNOSIS — I10 Essential (primary) hypertension: Secondary | ICD-10-CM | POA: Diagnosis not present

## 2024-09-20 DIAGNOSIS — I639 Cerebral infarction, unspecified: Secondary | ICD-10-CM | POA: Diagnosis not present

## 2024-09-20 DIAGNOSIS — Z7982 Long term (current) use of aspirin: Secondary | ICD-10-CM | POA: Insufficient documentation

## 2024-09-20 DIAGNOSIS — R569 Unspecified convulsions: Secondary | ICD-10-CM | POA: Diagnosis not present

## 2024-09-20 DIAGNOSIS — R9089 Other abnormal findings on diagnostic imaging of central nervous system: Secondary | ICD-10-CM | POA: Diagnosis not present

## 2024-09-20 DIAGNOSIS — Z87898 Personal history of other specified conditions: Secondary | ICD-10-CM

## 2024-09-20 DIAGNOSIS — I5032 Chronic diastolic (congestive) heart failure: Secondary | ICD-10-CM

## 2024-09-20 DIAGNOSIS — Z86718 Personal history of other venous thrombosis and embolism: Secondary | ICD-10-CM | POA: Diagnosis not present

## 2024-09-20 DIAGNOSIS — R2990 Unspecified symptoms and signs involving the nervous system: Principal | ICD-10-CM

## 2024-09-20 DIAGNOSIS — I480 Paroxysmal atrial fibrillation: Secondary | ICD-10-CM | POA: Diagnosis not present

## 2024-09-20 DIAGNOSIS — Z8739 Personal history of other diseases of the musculoskeletal system and connective tissue: Secondary | ICD-10-CM

## 2024-09-20 DIAGNOSIS — Z8679 Personal history of other diseases of the circulatory system: Secondary | ICD-10-CM | POA: Diagnosis not present

## 2024-09-20 DIAGNOSIS — E1122 Type 2 diabetes mellitus with diabetic chronic kidney disease: Secondary | ICD-10-CM | POA: Diagnosis not present

## 2024-09-20 DIAGNOSIS — R29810 Facial weakness: Secondary | ICD-10-CM | POA: Diagnosis present

## 2024-09-20 DIAGNOSIS — I6523 Occlusion and stenosis of bilateral carotid arteries: Secondary | ICD-10-CM | POA: Diagnosis not present

## 2024-09-20 DIAGNOSIS — E7800 Pure hypercholesterolemia, unspecified: Secondary | ICD-10-CM | POA: Diagnosis not present

## 2024-09-20 DIAGNOSIS — I6782 Cerebral ischemia: Secondary | ICD-10-CM | POA: Diagnosis not present

## 2024-09-20 DIAGNOSIS — E119 Type 2 diabetes mellitus without complications: Secondary | ICD-10-CM

## 2024-09-20 LAB — CBC
HCT: 49.7 % (ref 39.0–52.0)
Hemoglobin: 15.5 g/dL (ref 13.0–17.0)
MCH: 28.3 pg (ref 26.0–34.0)
MCHC: 31.2 g/dL (ref 30.0–36.0)
MCV: 90.7 fL (ref 80.0–100.0)
Platelets: 133 K/uL — ABNORMAL LOW (ref 150–400)
RBC: 5.48 MIL/uL (ref 4.22–5.81)
RDW: 14.2 % (ref 11.5–15.5)
WBC: 5.3 K/uL (ref 4.0–10.5)
nRBC: 0 % (ref 0.0–0.2)

## 2024-09-20 LAB — DIFFERENTIAL
Abs Immature Granulocytes: 0.02 K/uL (ref 0.00–0.07)
Basophils Absolute: 0 K/uL (ref 0.0–0.1)
Basophils Relative: 1 %
Eosinophils Absolute: 0 K/uL (ref 0.0–0.5)
Eosinophils Relative: 0 %
Immature Granulocytes: 0 %
Lymphocytes Relative: 27 %
Lymphs Abs: 1.4 K/uL (ref 0.7–4.0)
Monocytes Absolute: 0.6 K/uL (ref 0.1–1.0)
Monocytes Relative: 11 %
Neutro Abs: 3.3 K/uL (ref 1.7–7.7)
Neutrophils Relative %: 61 %

## 2024-09-20 LAB — COMPREHENSIVE METABOLIC PANEL WITH GFR
ALT: 30 U/L (ref 0–44)
AST: 21 U/L (ref 15–41)
Albumin: 3.8 g/dL (ref 3.5–5.0)
Alkaline Phosphatase: 112 U/L (ref 38–126)
Anion gap: 11 (ref 5–15)
BUN: 28 mg/dL — ABNORMAL HIGH (ref 8–23)
CO2: 22 mmol/L (ref 22–32)
Calcium: 8.5 mg/dL — ABNORMAL LOW (ref 8.9–10.3)
Chloride: 102 mmol/L (ref 98–111)
Creatinine, Ser: 1.49 mg/dL — ABNORMAL HIGH (ref 0.61–1.24)
GFR, Estimated: 48 mL/min — ABNORMAL LOW (ref >60)
Glucose, Bld: 116 mg/dL — ABNORMAL HIGH (ref 70–99)
Potassium: 4.5 mmol/L (ref 3.5–5.1)
Sodium: 135 mmol/L (ref 135–145)
Total Bilirubin: 0.6 mg/dL (ref 0.0–1.2)
Total Protein: 6.9 g/dL (ref 6.5–8.1)

## 2024-09-20 LAB — GLUCOSE, CAPILLARY: Glucose-Capillary: 140 mg/dL — ABNORMAL HIGH (ref 70–99)

## 2024-09-20 LAB — I-STAT CHEM 8, ED
BUN: 34 mg/dL — ABNORMAL HIGH (ref 8–23)
Calcium, Ion: 0.93 mmol/L — ABNORMAL LOW (ref 1.15–1.40)
Chloride: 108 mmol/L (ref 98–111)
Creatinine, Ser: 1.6 mg/dL — ABNORMAL HIGH (ref 0.61–1.24)
Glucose, Bld: 113 mg/dL — ABNORMAL HIGH (ref 70–99)
HCT: 50 % (ref 39.0–52.0)
Hemoglobin: 17 g/dL (ref 13.0–17.0)
Potassium: 4.6 mmol/L (ref 3.5–5.1)
Sodium: 137 mmol/L (ref 135–145)
TCO2: 23 mmol/L (ref 22–32)

## 2024-09-20 LAB — PROTIME-INR
INR: 1.8 — ABNORMAL HIGH (ref 0.8–1.2)
Prothrombin Time: 22.2 s — ABNORMAL HIGH (ref 11.4–15.2)

## 2024-09-20 LAB — CBG MONITORING, ED: Glucose-Capillary: 109 mg/dL — ABNORMAL HIGH (ref 70–99)

## 2024-09-20 LAB — ETHANOL: Alcohol, Ethyl (B): <15 mg/dL (ref <15)

## 2024-09-20 LAB — APTT: aPTT: 32 s (ref 24–36)

## 2024-09-20 LAB — HEMOGLOBIN A1C
Hgb A1c MFr Bld: 7.5 % — ABNORMAL HIGH (ref 4.8–5.6)
Mean Plasma Glucose: 168.55 mg/dL

## 2024-09-20 MED ORDER — INSULIN ASPART 100 UNIT/ML IJ SOLN: 0.0000 [IU] | Freq: Three times a day (TID) | INTRAMUSCULAR | Status: DC

## 2024-09-20 MED ORDER — IOHEXOL 350 MG/ML SOLN
75.0000 mL | Freq: Once | INTRAVENOUS | Status: AC | PRN
  Administered 2024-09-20: 75 mL via INTRAVENOUS

## 2024-09-20 MED ORDER — INSULIN GLARGINE 100 UNIT/ML ~~LOC~~ SOLN
22.0000 [IU] | Freq: Every day | SUBCUTANEOUS | Status: DC
  Administered 2024-09-20: 22 [IU] via SUBCUTANEOUS
  Filled 2024-09-20 (×2): qty 0.22

## 2024-09-20 MED ORDER — ASPIRIN 81 MG PO TBEC
81.0000 mg | DELAYED_RELEASE_TABLET | Freq: Every day | ORAL | Status: DC
Start: 2024-09-21 — End: 2024-09-21
  Administered 2024-09-21: 81 mg via ORAL
  Filled 2024-09-20: qty 1

## 2024-09-20 MED ORDER — DAPAGLIFLOZIN PROPANEDIOL 10 MG PO TABS
10.0000 mg | ORAL_TABLET | Freq: Every day | ORAL | Status: DC
  Administered 2024-09-21: 10 mg via ORAL
  Filled 2024-09-20: qty 1

## 2024-09-20 MED ORDER — INSULIN ASPART 100 UNIT/ML IJ SOLN: 0.0000 [IU] | Freq: Every day | INTRAMUSCULAR | Status: DC

## 2024-09-20 MED ORDER — PHENYTOIN SODIUM EXTENDED 100 MG PO CAPS
300.0000 mg | ORAL_CAPSULE | Freq: Every day | ORAL | Status: DC
  Administered 2024-09-20: 300 mg via ORAL
  Filled 2024-09-20 (×3): qty 3

## 2024-09-20 MED ORDER — LACOSAMIDE 50 MG PO TABS
50.0000 mg | ORAL_TABLET | Freq: Two times a day (BID) | ORAL | Status: DC
  Administered 2024-09-20 – 2024-09-21 (×2): 50 mg via ORAL
  Filled 2024-09-20 (×2): qty 1

## 2024-09-20 MED ORDER — SODIUM CHLORIDE 0.9% FLUSH
3.0000 mL | Freq: Once | INTRAVENOUS | Status: AC
  Administered 2024-09-20: 3 mL via INTRAVENOUS

## 2024-09-20 MED ORDER — ALLOPURINOL 100 MG PO TABS
100.0000 mg | ORAL_TABLET | Freq: Two times a day (BID) | ORAL | Status: DC
  Administered 2024-09-21: 100 mg via ORAL
  Filled 2024-09-20: qty 1

## 2024-09-20 MED ORDER — LATANOPROST 0.005 % OP SOLN
1.0000 [drp] | Freq: Every day | OPHTHALMIC | Status: DC
  Administered 2024-09-20: 1 [drp] via OPHTHALMIC
  Filled 2024-09-20: qty 2.5

## 2024-09-20 MED ORDER — FUROSEMIDE 20 MG PO TABS
20.0000 mg | ORAL_TABLET | Freq: Every day | ORAL | Status: DC
  Administered 2024-09-21: 20 mg via ORAL
  Filled 2024-09-20: qty 1

## 2024-09-20 MED ORDER — WARFARIN - PHARMACIST DOSING INPATIENT
Freq: Every day | Status: DC
Start: 2024-09-20 — End: 2024-09-21

## 2024-09-20 MED ORDER — LOSARTAN POTASSIUM 25 MG PO TABS
25.0000 mg | ORAL_TABLET | Freq: Every day | ORAL | Status: DC
  Administered 2024-09-21: 25 mg via ORAL
  Filled 2024-09-20: qty 1

## 2024-09-20 NOTE — Consult Note (Signed)
 NEUROLOGY CONSULT NOTE   Date of service: September 20, 2024 Patient Name: Jason Salazar MRN:  [id number] DOB:  22-Apr-1946 Chief Complaint: right facial numbness Requesting Provider: Patsy Lenis, MD  History of Present Illness  Jason Salazar is a 78 y.o. male with hx of atrial fibrillation/paroxysmal A-fib on Coumadin , Mobitz type I block, history of syncope with ILR in place, CKD 3A, diabetes, pretension, hyperlipidemia, chronic diastolic CHF, OSA on CPAP, history of seizure disorder on Vimpat  and Dilantin , history of prostate cancer with [facility], presented to the ER after developing transient right-sided facial numbness sometime this morning with the episode lasting 5 minutes.  Evaluated by ER and case discussed with neurology-admitted for TIA workup Reports compliance to medications.  INR 1.8.  Initial triage note said numbness on the face and possible right facial asymmetry which has since all resolved.  He confirms this history.  Outside neurology note review reveals that he is being transitioned from Dilantin  to Vimpat  but they do not want to take him off of Dilantin  just about yet. He has not been tried on a DOAC  LKW: 12:30 PM Modified rankin score: 0-Completely asymptomatic and back to baseline post- stroke IV Thrombolysis: No-symptoms resolved EVT: ELVO NIH stroke scale 0   ROS  Comprehensive ROS performed and pertinent positives documented in HPI    Past History   Past Medical History:  Diagnosis Date   Allergic rhinitis    Balantidiasis    Cancer (HCC)    prostate cancer treated 2007 with radiation seed placement   Diabetes (HCC)    H/O prostate cancer    Radioactive seeds   Hypercholesteremia    Hyperlipidemia    Hyperlipoproteinemia    Hypertension    Sleep apnea     Past Surgical History:  Procedure Laterality Date   cyst removed from right kidney      Family History: Family History  Problem  Relation Age of Onset   CVA Mother    Sleep apnea Brother    Thyroid  cancer Father    Prostate cancer Father     Social History  reports that he has never smoked. He has never used smokeless tobacco. He reports that he does not drink alcohol and does not use drugs.  Allergies  Allergen Reactions   Atorvastatin Other (See Comments)    Light-headedness    Medications   Current Facility-Administered Medications:    insulin  aspart (novoLOG) injection 0-5 Units, 0-5 Units, Subcutaneous, QHS, Girguis, David, MD   NOREEN ON 09/21/2024] insulin  aspart (novoLOG) injection 0-9 Units, 0-9 Units, Subcutaneous, TID WC, Patsy Lenis, MD  Current Outpatient Medications:    allopurinol  (ZYLOPRIM ) 100 MG tablet, Take 100 mg by mouth 2 (two) times daily as needed (for gout flares)., Disp: , Rfl:    Apoaequorin (PREVAGEN PO), Take 1 tablet by mouth See admin instructions. Prevagen chewable tablets - Chew 1 tablet by mouth once a day, Disp: , Rfl:    aspirin EC 81 MG tablet, Take 81 mg by mouth in the morning., Disp: , Rfl:    b complex vitamins tablet, Take 1 tablet by mouth daily., Disp: , Rfl:    bimatoprost  (LUMIGAN ) 0.01 % SOLN, Instill 1 drop into both eyes at bedtime, Disp: 7.5 mL, Rfl: 6   Cholecalciferol (VITAMIN D3) 1000 units CAPS, Take 1,000 Units by mouth daily., Disp: , Rfl:    Colchicine  0.6 MG CAPS, Take 1 capsule by mouth 2 (two) times daily as  needed for gout flare up, Disp: 30 capsule, Rfl: 2   Continuous Glucose Sensor (FREESTYLE LIBRE 3 PLUS SENSOR) MISC, Change sensor every 15 days., Disp: 6 each, Rfl: 1   dapagliflozin  propanediol (FARXIGA ) 10 MG TABS tablet, TAKE 1 TABLET BY MOUTH EVERY DAY, Disp: 90 tablet, Rfl: 1   DILANTIN  100 MG ER capsule, Take 3 capsules (300 mg total) by mouth at bedtime., Disp: 90 capsule, Rfl: 12   furosemide  (LASIX ) 20 MG tablet, Take 1 tablet (20 mg total) by mouth daily., Disp: 90 tablet, Rfl: 3   ibuprofen (ADVIL) 800 MG tablet, Take 800 mg by  mouth every 8 (eight) hours as needed (for pain)., Disp: , Rfl:    insulin  degludec (TRESIBA ) 100 UNIT/ML FlexTouch Pen, Inject 22 Units into the skin at bedtime., Disp: 15 mL, Rfl: 1   lacosamide  (VIMPAT ) 50 MG TABS tablet, Take 1 tablet (50 mg total) by mouth 2 (two) times daily. (Patient taking differently: Take 50 mg by mouth See admin instructions. Take 50 mg by mouth in the morning and evening), Disp: 60 tablet, Rfl: 5   losartan  (COZAAR ) 25 MG tablet, Take 1 tablet (25 mg total) by mouth daily., Disp: 90 tablet, Rfl: 1   mometasone (NASONEX) 50 MCG/ACT nasal spray, Place 2 sprays into the nose daily as needed (for seasonal allergies)., Disp: , Rfl:    Multiple Vitamins-Minerals (ONE-A-DAY MENS 50+ ADVANTAGE PO), Take 1 tablet by mouth daily with breakfast., Disp: , Rfl:    tiZANidine (ZANAFLEX) 4 MG tablet, Take 4 mg by mouth 2 (two) times daily as needed (for pain)., Disp: , Rfl:    warfarin (COUMADIN ) 5 MG tablet, Take 1 tablet (5 mg total) by mouth daily. (Patient taking differently: Take 2.5-5 mg by mouth See admin instructions. Take 2.5 mg by mouth in the morning on Sun/Tues/Thurs/Sat and 5 mg on Mon/Wed/Fri), Disp: 30 tablet, Rfl: 11   Blood Pressure Monitoring (BLOOD PRESSURE CUFF) MISC, Use to check blood pressure daily., Disp: 1 each, Rfl: 0   Vitamin C 250 MG TABS, Take 250 mg by mouth 1 day or 1 dose., Disp: , Rfl:   Vitals   Vitals:   09-21-24 1515 2024-09-21 1544 09-21-2024 1630 September 21, 2024 1700  BP: 139/71  (!) 169/81 (!) 166/92  Pulse: (!) 58  66 63  Resp: 12  17 13   Temp:  98.1 F (36.7 C)    TempSrc:  Oral    SpO2: 97%  100% 100%    There is no height or weight on file to calculate BMI.   Physical Exam   General: Awake alert in no distress HEENT: Normocephalic atraumatic Lungs: Clear Cardiovascular: Regular rhythm Neurological exam Awake alert oriented x 3 No dysarthria No evidence of aphasia Cranial nerves II to XII intact Motor examination reveals no drift in  any of the 4 extremities Sensation intact light touch Coordination exam reveals no dysmetria NIH stroke scale 0  Labs/Imaging/Neurodiagnostic studies   CBC:  Recent Labs  Lab 09-21-2024 1353 09-21-2024 1405  WBC 5.3  --   NEUTROABS 3.3  --   HGB 15.5 17.0  HCT 49.7 50.0  MCV 90.7  --   PLT 133*  --    Basic Metabolic Panel:  Lab Results  Component Value Date   NA 137 09-21-24   K 4.6 09/21/2024   CO2 22 09/21/24   GLUCOSE 113 (H) 09/21/2024   BUN 34 (H) 2024-09-21   CREATININE 1.60 (H) 09-21-24   CALCIUM  8.5 (L) 09-21-2024  GFRNONAA 48 (L) 09/20/2024   GFRAA 53 (L) 03/19/2021   Lipid Panel:  Lab Results  Component Value Date   LDLCALC 116 (H) 08/31/2024   HgbA1c:  Lab Results  Component Value Date   HGBA1C 7.5 (H) 09/20/2024   Alcohol Level     Component Value Date/Time   [facility] <15 09/20/2024 1353   INR  Lab Results  Component Value Date   INR 1.8 (H) 09/20/2024   APTT  Lab Results  Component Value Date   APTT 32 09/20/2024   AED levels:  Lab Results  Component Value Date   PHENYTOIN  16.8 09/13/2024   CT angio Head and Neck with contrast(Personally reviewed): No ELVO.  Generalized parenchymal volume loss on the CT head.  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  MR brain without contrast, no acute finding  ASSESSMENT   Jason Salazar is a 78 y.o. male admitted for evaluation of transient right facial numbness that lasted a few minutes.  Given his significant cerebrovascular risk factor history, he was admitted for further workup He also has a history of seizures.  He is on Dilantin  which is being transitioned to Vimpat  but the outpatient provider has not yet completely stopped Dilantin . I suspect that the hesitation to completely take him off of Dilantin  is because of the fact that he has been on it for over 4-5 decades. He is done well in terms of his  seizures. His most recent episode of transient facial numbness could be a TIA but he is optimized in terms of treatment with anticoagulation. To complete the workup, he should get a 2D echocardiogram.  Impression: Evaluate for TIA causing right facial numbness which was transient  RECOMMENDATIONS  Frequent rechecks Telemetry Check A1c-goal less than 7 Check lipid panel-LDL goal less than 70.  He been seen at the lipid clinic for consideration for Repatha.  Will defer management per the lipid clinic Currently on Coumadin .  INR 1.8.  Pharmacy consult to get his Coumadin  therapeutic. Outpatient consideration for switching him from Coumadin  to DOAC since he is now being transition from Dilantin  to Vimpat  (lacosamide ), which should not have as concerning of interaction profile as Dilantin  does to DOACs. 2D echocardiogram. Please call back if 2D echocardiogram has any concerning findings Therapy assessments Follow-up outpatient neurology in 8 to 12 weeks Follow-up with cardiology as planned  Stroke team will be available as needed  Plan relayed to Dr. Patsy  ______________________________________________________________________    Signed, Eligio Lav, MD [facility]

## 2024-09-20 NOTE — Assessment & Plan Note (Signed)
 -  Continue nightly CPAP

## 2024-09-20 NOTE — Assessment & Plan Note (Signed)
-   Follows with cardiology - Has [employer] ILR in place

## 2024-09-20 NOTE — Telephone Encounter (Signed)
   Pharmacy Patient Advocate Encounter   Received notification from Pt Calls Messages that prior authorization for REPATHA is required/requested.   Insurance verification completed.   The patient is insured through [facility] ADVANTAGE/RX ADVANCE.   Per test claim: PA required; PA submitted to above mentioned insurance via Latent Key/confirmation #/EOC [id number] Status is pending    Pharmacy Patient Advocate Encounter  Received notification from [facility] ADVANTAGE/RX ADVANCE that Prior Authorization for REPATHA has been APPROVED from 09/20/24 to 03/19/25. Ran test claim, Copay is $0.00. This test claim was processed through [facility]- copay amounts may vary at other pharmacies due to pharmacy/plan contracts, or as the patient moves through the different stages of their insurance plan.   PA #/Case ID/Reference #: [id number]

## 2024-09-20 NOTE — Assessment & Plan Note (Signed)
-   hx CVA - CT angio head/neck notes remote infarcts involving left corona radiata, left BG, and possible remote infarct right parietal lobe -Now presenting with very transient episode right facial numbness; neurology concerned for potential TIA -Limited evaluation on CTA head/neck due to contrast bolus timing but no hemodynamically significant stenosis noted in head or neck -Follow-up lipid panel -On aspirin and Coumadin  at home.  INR subtherapeutic -Follow-up MRI brain

## 2024-09-20 NOTE — Assessment & Plan Note (Addendum)
 CT angio head/neck notes remote infarcts involving left corona radiata, left BG, and possible remote infarct right parietal lobe - On aspirin and Coumadin  (latter for A-fib) - Not on statin

## 2024-09-20 NOTE — Progress Notes (Signed)
 PHARMACY - ANTICOAGULATION CONSULT NOTE  Pharmacy Consult for warfarin Indication: atrial fibrillation  Allergies  Allergen Reactions   Atorvastatin Other (See Comments)    Other reaction(s): lightheaded   Patient Measurements:    Vital Signs: Temp: 98.1 F (36.7 C) (10/13 1544) Temp Source: Oral (10/13 1544) BP: 166/92 (10/13 1700) Pulse Rate: 63 (10/13 1700)  Labs: Recent Labs    09/20/24 1353 09/20/24 1405 09/20/24 1415  HGB 15.5 17.0  --   HCT 49.7 50.0  --   PLT 133*  --   --   APTT  --   --  32  LABPROT  --   --  22.2*  INR  --   --  1.8*  CREATININE 1.49* 1.60*  --     Estimated Creatinine Clearance: 37.2 mL/min (A) (by C-G formula based on SCr of 1.6 mg/dL (H)).   Medical History: Past Medical History:  Diagnosis Date   Allergic rhinitis    Balantidiasis    Cancer (HCC)    prostate cancer treated 2007 with radiation seed placement   Diabetes (HCC)    H/O prostate cancer    Radioactive seeds   Hypercholesteremia    Hyperlipidemia    Hyperlipoproteinemia    Hypertension    Sleep apnea     Medications:  Scheduled:   insulin  aspart  0-5 Units Subcutaneous QHS   [START ON 09/21/2024] insulin  aspart  0-9 Units Subcutaneous TID WC  Infusions:   Assessment: Patient is a 78 YO male who presented to the ED with facial numbness and trouble smiling which resolved after 5 minutes. Neurology consulted for possible TIA workup. Patient also on warfarin PTA for atrial fibrillation. PTA regimen is 5 mg tablets taken as 1/2 tablet daily EXCEPT 1 tablet on Mondays, Wednesdays, and Fridays. Last outpatient INR was 1.8 on 10/2 but was historically well controlled on this regimen. Notably, patient takes Dilantin  PTA, but dose has remained stable at four 200mg  tablets nightly since July 2025.   Hgb wnl; platelets slightly low but stable at 113. Initial INR on admission 1.8. Patient states last taken warfarin this morning prior to arrival at the ED  Goal of Therapy:   INR 2-3 Monitor platelets by anticoagulation protocol: Yes   Plan:  -Do not give warfarin tonight; has taken 5 mg po earlier today.  -Monitor INR, CBC daily.   Maurilio Patten, PharmD PGY1 Pharmacy Resident [facility] 09/20/2024 6:56 PM

## 2024-09-20 NOTE — Assessment & Plan Note (Addendum)
 Assessment:  LDL goal: < 70 mg/dl; last LDLc 883 mg/dl (0/7974) Rosuvastatin  is currently on hold due to elevated GGT (268); Patient tolerates rosuvastatin  otherwise AST/ALT wnl; Alk phos mildly elevated Will avoid initiating statins, ezetimibe or bempedoic acid at this time due to elevated GGT. Though I would expect concurrent elevations in other LFTs such as ALT and AST if statin was the cause Discussed next potential options (PCSK-9 inhibitors inclisiran); cost, dosing efficacy, side effects  Discussed lifestyle modifications (diet and exercise); Provided and discussed Planning Healthy Meals diet and Lowering LDL handouts - Patient aware to keep vitamin K foods consistent with warfarin use  Plan: Will apply for PA for PCSK9i; will inform patient upon approval  Lipid lab due in 3 months after starting PCSK9i  Instructed patient to have GGT rechecked ~ 6 weeks from when he discontinued rosuvastatin  as instructed by cardiology provider

## 2024-09-20 NOTE — ED Triage Notes (Addendum)
 Pt BIB [facility] from home, family reported 5 mins face went numb, possibly had trouble smiling per family at about 1230. Symptoms resolved upon fire arrival, pt denies facial numbness currently but states he feels a warmness on the top of his head that has been ongoing for awhile. Hx of stroke.   160/80 HR 78 98% room air CBG 131

## 2024-09-20 NOTE — Assessment & Plan Note (Signed)
-   Follows with cardiology and Coumadin  clinic - Rate controlled without medication - INR subtherapeutic on admission, 1.8; historically INRs seem to be well-controlled - Coumadin  managed by pharmacy during hospitalization and patient continued on home regimen at discharge

## 2024-09-20 NOTE — H&P (Signed)
 History and Physical    Jason Salazar  FMW:[id number]  DOB: 1946/06/18  DOA: 09/20/2024  PCP: Vicci Barnie NOVAK, MD Patient coming from: Home  Chief Complaint: right facial numbness  HPI:  Jason Salazar is a 78 year old male with PMH CVA, A-flutter/PAF/Mobitz 1, hx syncope (ILR in place), CKD 3 AA, DM II, HTN, HLD, chronic diastolic CHF, OSA on CPAP, seizure disorder, prostate cancer (implanted XRT seeds @ [facility] now follows with [facility]), obesity, gout.  He presented to the ER after developing transient right sided facial numbness.  Episode lasted approximately 5 minutes then spontaneously resolved.  No other notable focal neurodeficits at that time.  Due to concern for stroke, he presented to ER for further evaluation. Case was also discussed with neurology, who recommended admission for TIA/stroke workup.  No reports of seizure activity.  He follows closely with neurology also and recently seen on 09/13/2024 and noted to be doing stable at that time.  No change in medications.  I have personally briefly reviewed patient's old medical records in [facility] and discussed patient with the ER provider when appropriate/indicated.  Assessment and Plan: Right facial numbness - hx CVA - CT angio head/neck notes remote infarcts involving left corona radiata, left BG, and possible remote infarct right parietal lobe -Now presenting with very transient episode right facial numbness; neurology concerned for potential TIA -Limited evaluation on CTA head/neck due to contrast bolus timing but no hemodynamically significant stenosis noted in head or neck -Follow-up lipid panel -On aspirin and Coumadin  at home.  INR subtherapeutic -Follow-up MRI brain  PAF (paroxysmal atrial fibrillation) (HCC) - Follows with cardiology and Coumadin  clinic - Rate controlled without medication - INR subtherapeutic on admission, 1.8; historically INRs seem to be well-controlled - Continue Coumadin  per  pharmacy; no change in Dilantin  dosing recently.  Will defer to pharmacy if felt need for Coumadin  adjustment at discharge as well  [facility]) - Follows with neurology; recently seen 09/13/2024.  Stable.  No change in medications at that time - Continues on Vimpat  50 mg twice daily and Dilantin  ER 300 mg at bedtime  History of syncope - Follows with cardiology - Has [employer] ILR in place  Chronic diastolic CHF (congestive heart failure) (HCC) - No S/S exacerbation - Last echo 12/12/2022: EF 60 to 65%, no RWMA, mild LVH, grade 1 DD - On Lasix  daily at home  Diabetes (HCC) - Check A1c - On Farxiga , Tresiba  at home - SSI and CBG monitoring as well  History of gout - Continue allopurinol   History of CVA (cerebrovascular accident) CT angio head/neck notes remote infarcts involving left corona radiata, left BG, and possible remote infarct right parietal lobe - On aspirin and Coumadin  (latter for A-fib) - Not on statin  Benign essential hypertension - Continue losartan  and Lasix   Stage 3a chronic kidney disease (HCC) - patient has history of CKD3a. Baseline creat ~ 1.2 - 1.4, eGFR~ 48 - 58 - at baseline    Hypercholesteremia - Follow-up lipid panel - Noted allergy to Lipitor (lightheadedness) - no other treatment noted at home   Obstructive sleep apnea - Continue nightly CPAP   Code Status: Full    Code Status: Not on file  DVT Prophylaxis: Coumadin       Anticipated disposition is to: Home   History: Past Medical History:  Diagnosis Date   Allergic rhinitis    Balantidiasis    Cancer (HCC)    prostate cancer treated 2007 with radiation seed placement   Diabetes (HCC)  H/O prostate cancer    Radioactive seeds   Hypercholesteremia    Hyperlipidemia    Hyperlipoproteinemia    Hypertension    Sleep apnea     Past Surgical History:  Procedure Laterality Date   cyst removed from right kidney       reports that he has never smoked. He has never used  smokeless tobacco. He reports that he does not drink alcohol and does not use drugs.  Allergies  Allergen Reactions   Atorvastatin Other (See Comments)    Other reaction(s): lightheaded    Family History  Problem Relation Age of Onset   CVA Mother    Sleep apnea Brother    Thyroid  cancer Father    Prostate cancer Father    Home Medications: Prior to Admission medications   Medication Sig Start Date End Date Taking? Authorizing Provider  allopurinol  (ZYLOPRIM ) 100 MG tablet Take 100 mg by mouth 2 (two) times daily.    [provider]  aspirin EC 81 MG tablet Take 81 mg by mouth daily.    [provider]  b complex vitamins tablet Take 1 tablet by mouth daily.    [provider]  bimatoprost  (LUMIGAN ) 0.01 % SOLN Instill 1 drop into both eyes at bedtime 01/02/22     Blood Pressure Monitoring (BLOOD PRESSURE CUFF) MISC Use to check blood pressure daily. 08/31/24   Vicci Barnie NOVAK, MD  Cholecalciferol (VITAMIN D3) 1000 units CAPS Take 1,000 Units by mouth 1 day or 1 dose. 12/09/22   [provider]  Colchicine  0.6 MG CAPS Take 1 capsule by mouth 2 (two) times daily as needed for gout flare up 02/04/22     Continuous Glucose Sensor (FREESTYLE LIBRE 3 PLUS SENSOR) MISC Change sensor every 15 days. 05/24/24   Nida, Gebreselassie W, MD  dapagliflozin  propanediol (FARXIGA ) 10 MG TABS tablet TAKE 1 TABLET BY MOUTH EVERY DAY 04/08/24   Nida, Gebreselassie W, MD  DILANTIN  100 MG ER capsule Take 3 capsules (300 mg total) by mouth at bedtime. 09/13/24   Penumalli, Vikram R, MD  furosemide  (LASIX ) 20 MG tablet Take 1 tablet (20 mg total) by mouth daily. 03/18/23   Chandrasekhar, Stanly A, MD  insulin  degludec (TRESIBA ) 100 UNIT/ML FlexTouch Pen Inject 22 Units into the skin at bedtime. 07/20/24   Vicci Barnie NOVAK, MD  lacosamide  (VIMPAT ) 50 MG TABS tablet Take 1 tablet (50 mg total) by mouth 2 (two) times daily. 09/13/24   Penumalli, Eduard SAUNDERS, MD  losartan  (COZAAR ) 25 MG  tablet Take 1 tablet (25 mg total) by mouth daily. 07/20/24   Vicci Barnie NOVAK, MD  mometasone (NASONEX) 50 MCG/ACT nasal spray 2 sprays as needed. 06/21/20   [provider]  Multiple Vitamins-Minerals (ONE-A-DAY MENS 50+ ADVANTAGE PO) Take 1 tablet by mouth daily.    [provider]  tiZANidine (ZANAFLEX) 4 MG tablet Take 4 mg by mouth 2 (two) times daily as needed. 04/17/23   [provider]  Vitamin C 250 MG TABS Take 250 mg by mouth 1 day or 1 dose. 12/09/22   [provider]  warfarin (COUMADIN ) 5 MG tablet Take 1 tablet (5 mg total) by mouth daily. 07/30/24   Kennyth Chew, MD    Review of Systems:  Review of Systems  Constitutional: Negative.   HENT: Negative.    Eyes: Negative.   Respiratory: Negative.    Cardiovascular: Negative.   Gastrointestinal: Negative.   Genitourinary: Negative.   Musculoskeletal: Negative.   Skin:  Negative.   Neurological: Negative.   Endo/Heme/Allergies: Negative.   Psychiatric/Behavioral: Negative.      Physical Exam:  Vitals:   09/20/24 1515 09/20/24 1544 09/20/24 1630 09/20/24 1700  BP: 139/71  (!) 169/81 (!) 166/92  Pulse: (!) 58  66 63  Resp: 12  17 13   Temp:  98.1 F (36.7 C)    TempSrc:  Oral    SpO2: 97%  100% 100%   Physical Exam Constitutional:      General: He is not in acute distress.    Appearance: Normal appearance.  HENT:     Head: Normocephalic and atraumatic.     Mouth/Throat:     Mouth: Mucous membranes are moist.  Eyes:     Extraocular Movements: Extraocular movements intact.  Cardiovascular:     Rate and Rhythm: Normal rate and regular rhythm.  Pulmonary:     Effort: Pulmonary effort is normal. No respiratory distress.     Breath sounds: Normal breath sounds. No wheezing.  Abdominal:     General: Abdomen is protuberant. Bowel sounds are normal. There is no distension.     Palpations: Abdomen is soft.     Tenderness: There is no abdominal tenderness.  Musculoskeletal:         General: Normal range of motion.     Cervical back: Normal range of motion and neck supple.  Skin:    General: Skin is warm and dry.  Neurological:     General: No focal deficit present.     Mental Status: He is alert.  Psychiatric:        Mood and Affect: Mood normal.        Behavior: Behavior normal.      Labs on Admission:  I have personally reviewed following labs and imaging studies Results for orders placed or performed during the hospital encounter of 09/20/24 (from the past 24 hours)  Hemoglobin A1c     Status: Abnormal   Collection Time: 09/20/24  1:50 PM  Result Value Ref Range   Hgb A1c MFr Bld 7.5 (H) 4.8 - 5.6 %   Mean Plasma Glucose 168.55 mg/dL  CBG monitoring, ED     Status: Abnormal   Collection Time: 09/20/24  1:52 PM  Result Value Ref Range   Glucose-Capillary 109 (H) 70 - 99 mg/dL  CBC     Status: Abnormal   Collection Time: 09/20/24  1:53 PM  Result Value Ref Range   WBC 5.3 4.0 - 10.5 K/uL   RBC 5.48 4.22 - 5.81 MIL/uL   Hemoglobin 15.5 13.0 - 17.0 g/dL   HCT 50.2 60.9 - 47.9 %   MCV 90.7 80.0 - 100.0 fL   MCH 28.3 26.0 - 34.0 pg   MCHC 31.2 30.0 - 36.0 g/dL   RDW 85.7 88.4 - 84.4 %   Platelets 133 (L) 150 - 400 K/uL   nRBC 0.0 0.0 - 0.2 %  Differential     Status: None   Collection Time: 09/20/24  1:53 PM  Result Value Ref Range   Neutrophils Relative % 61 %   Neutro Abs 3.3 1.7 - 7.7 K/uL   Lymphocytes Relative 27 %   Lymphs Abs 1.4 0.7 - 4.0 K/uL   Monocytes Relative 11 %   Monocytes Absolute 0.6 0.1 - 1.0 K/uL   Eosinophils Relative 0 %   Eosinophils Absolute 0.0 0.0 - 0.5 K/uL   Basophils Relative 1 %   Basophils Absolute 0.0 0.0 - 0.1 K/uL   Immature  Granulocytes 0 %   Abs Immature Granulocytes 0.02 0.00 - 0.07 K/uL  Comprehensive metabolic panel     Status: Abnormal   Collection Time: 09/20/24  1:53 PM  Result Value Ref Range   Sodium 135 135 - 145 mmol/L   Potassium 4.5 3.5 - 5.1 mmol/L   Chloride 102 98 - 111 mmol/L   CO2 22 22  - 32 mmol/L   Glucose, Bld 116 (H) 70 - 99 mg/dL   BUN 28 (H) 8 - 23 mg/dL   Creatinine, Ser 8.50 (H) 0.61 - 1.24 mg/dL   Calcium  8.5 (L) 8.9 - 10.3 mg/dL   Total Protein 6.9 6.5 - 8.1 g/dL   Albumin 3.8 3.5 - 5.0 g/dL   AST 21 15 - 41 U/L   ALT 30 0 - 44 U/L   Alkaline Phosphatase 112 38 - 126 U/L   Total Bilirubin 0.6 0.0 - 1.2 mg/dL   GFR, Estimated 48 (L) >60 mL/min   Anion gap 11 5 - 15  Ethanol     Status: None   Collection Time: 09/20/24  1:53 PM  Result Value Ref Range   Alcohol, Ethyl (B) <15 <15 mg/dL  I-stat chem 8, ED     Status: Abnormal   Collection Time: 09/20/24  2:05 PM  Result Value Ref Range   Sodium 137 135 - 145 mmol/L   Potassium 4.6 3.5 - 5.1 mmol/L   Chloride 108 98 - 111 mmol/L   BUN 34 (H) 8 - 23 mg/dL   Creatinine, Ser 8.39 (H) 0.61 - 1.24 mg/dL   Glucose, Bld 886 (H) 70 - 99 mg/dL   Calcium , Ion 0.93 (L) 1.15 - 1.40 mmol/L   TCO2 23 22 - 32 mmol/L   Hemoglobin 17.0 13.0 - 17.0 g/dL   HCT 49.9 60.9 - 47.9 %  Protime-INR     Status: Abnormal   Collection Time: 09/20/24  2:15 PM  Result Value Ref Range   Prothrombin Time 22.2 (H) 11.4 - 15.2 seconds   INR 1.8 (H) 0.8 - 1.2  APTT     Status: None   Collection Time: 09/20/24  2:15 PM  Result Value Ref Range   aPTT 32 24 - 36 seconds     Radiological Exams on Admission: CT ANGIO HEAD NECK W WO CM Result Date: 09/20/2024 EXAM: CTA HEAD AND NECK WITH AND WITHOUT 09/20/2024 03:04:00 PM TECHNIQUE: CTA of the head and neck was performed with and without the administration of 75 mL of iohexol  (OMNIPAQUE ) 350 MG/ML injection. Multiplanar 2D and/or 3D reformatted images are provided for review. Automated exposure control, iterative reconstruction, and/or weight based adjustment of the mA/kV was utilized to reduce the radiation dose to as low as reasonably achievable. Stenosis of the internal carotid arteries measured using NASCET criteria. COMPARISON: CTA head and neck 03/08/2014 and MRI head 02/19/2014  CLINICAL HISTORY: R sided facial numbness. 75ml [id number]; Pt BIB [facility] from home, family reported 5 mins face went numb, possibly had trouble smiling per family at about 1230. Symptoms resolved upon fire arrival, pt denies facial numbness currently but states he feels a warmness on the top of his head that has been ongoing for awhile. Hx of stroke. FINDINGS: CTA NECK: AORTIC ARCH AND ARCH VESSELS: Atherosclerosis of the visualized aortic arch. No dissection or arterial injury. No significant stenosis of the brachiocephalic or subclavian arteries. CERVICAL CAROTID ARTERIES: No dissection, arterial injury, or hemodynamically significant stenosis by NASCET criteria. CERVICAL VERTEBRAL ARTERIES: There is limited evaluation of  the vertebral arteries due to vessel caliber and contrast timing as well as streak artifact. Within these limitations, the visualized intracranial vertebral artery is patent. No dissection, arterial injury, or significant stenosis. LUNGS AND MEDIASTINUM: Unremarkable. SOFT TISSUES: No acute abnormality. BONES: Degenerative changes in the visualized spine. CTA HEAD: ANTERIOR CIRCULATION: The intracranial internal carotid arteries are patent bilaterally. Atherosclerosis of the carotid siphons which contributes to mild stenosis of the bilateral cavernous ICAs. The anterior cerebral arteries are patent proximally. Limited evaluation of distal ACA branches due to contrast bolus timing and artifact. The middle cerebral arteries are patent proximally. There is limited evaluation of distal MCA branches due to contrast bolus timing and artifact. No aneurysm. POSTERIOR CIRCULATION: No significant stenosis of the posterior cerebral arteries. No significant stenosis of the basilar artery. No significant stenosis of the vertebral arteries. No aneurysm. OTHER: Generalized parenchymal volume loss. Nonspecific hypoattenuation in the periventricular and subcortical white matter, most likely representing chronic  microvascular ischemic changes. Remote infarcts in the left corona radiata and left basal ganglia. Possible remote infarct in the right parietal lobe. No dural venous sinus thrombosis on this non-dedicated study. IMPRESSION: 1. Slightly limited evaluation as above, due to contrast bolus timing and streak artifact. Within these limitations, no large vessel occlusion or hemodynamically significant stenosis in the head or neck. 2. Atherosclerosis of the carotid siphons contributing to mild stenosis of the bilateral cavernous ICAs. 3. Generalized parenchymal volume loss and chronic microvascular ischemic changes. 4. Remote infarcts in the left corona radiata and left basal ganglia, with a possible remote infarct in the right parietal lobe. Electronically signed by: Donnice Mania MD 09/20/2024 03:58 PM EDT RP Workstation: [id number]   CT ANGIO HEAD NECK W WO CM  Final Result    MR BRAIN WO CONTRAST    (Results Pending)    Consults called:  Neurology   EKG: Independently reviewed. Sinus rhythm, rate 60.  Mobitz type I noted   Alm Apo, MD [facility] 09/20/2024, 6:40 PM

## 2024-09-20 NOTE — ED Provider Notes (Signed)
 [place] [facility] DE[facility]MENT AT [facility] Provider Note   CSN: [id number] Arrival date & time: 09/20/24  1317     Patient presents with: No chief complaint on file.   Jason Salazar is a 78 y.o. male.   78 year old male with a history of hypertension, seizures, DM, atrial fibrillation on Coumadin , and HTN who presents to the emergency department with transient right sided facial numbness.  At 1230 today started having loss of sensation to the right side of his face.  Lasted approximately 5 minutes and resolved.  No other deficits or symptoms at this time.  EMS reported that they thought they may have seen some facial weakness but the patient and his wife are not sure about this.  No other weakness or numbness.  No severe headache.  Is on Coumadin  because of a Medtronic cardiac device that he has       Prior to Admission medications   Medication Sig Start Date End Date Taking? Authorizing Provider  allopurinol  (ZYLOPRIM ) 100 MG tablet Take 100 mg by mouth 2 (two) times daily.    [provider]  aspirin EC 81 MG tablet Take 81 mg by mouth daily.    [provider]  b complex vitamins tablet Take 1 tablet by mouth daily.    [provider]  bimatoprost  (LUMIGAN ) 0.01 % SOLN Instill 1 drop into both eyes at bedtime 01/02/22     Blood Pressure Monitoring (BLOOD PRESSURE CUFF) MISC Use to check blood pressure daily. 08/31/24   Vicci Barnie NOVAK, MD  Cholecalciferol (VITAMIN D3) 1000 units CAPS Take 1,000 Units by mouth 1 day or 1 dose. 12/09/22   [provider]  Colchicine  0.6 MG CAPS Take 1 capsule by mouth 2 (two) times daily as needed for gout flare up 02/04/22     Continuous Glucose Sensor (FREESTYLE LIBRE 3 PLUS SENSOR) MISC Change sensor every 15 days. 05/24/24   Nida, Gebreselassie W, MD  dapagliflozin  propanediol (FARXIGA ) 10 MG TABS tablet TAKE 1 TABLET BY MOUTH EVERY DAY 04/08/24   Nida, Gebreselassie W, MD  DILANTIN  100 MG ER capsule  Take 3 capsules (300 mg total) by mouth at bedtime. 09/13/24   Penumalli, Eduard SAUNDERS, MD  furosemide  (LASIX ) 20 MG tablet Take 1 tablet (20 mg total) by mouth daily. 03/18/23   Santo Stanly LABOR, MD  insulin  degludec (TRESIBA ) 100 UNIT/ML FlexTouch Pen Inject 22 Units into the skin at bedtime. 07/20/24   Vicci Barnie NOVAK, MD  lacosamide  (VIMPAT ) 50 MG TABS tablet Take 1 tablet (50 mg total) by mouth 2 (two) times daily. 09/13/24   Penumalli, Eduard SAUNDERS, MD  losartan  (COZAAR ) 25 MG tablet Take 1 tablet (25 mg total) by mouth daily. 07/20/24   Vicci Barnie NOVAK, MD  mometasone (NASONEX) 50 MCG/ACT nasal spray 2 sprays as needed. 06/21/20   [provider]  Multiple Vitamins-Minerals (ONE-A-DAY MENS 50+ ADVANTAGE PO) Take 1 tablet by mouth daily.    [provider]  tiZANidine (ZANAFLEX) 4 MG tablet Take 4 mg by mouth 2 (two) times daily as needed. 04/17/23   [provider]  Vitamin C 250 MG TABS Take 250 mg by mouth 1 day or 1 dose. 12/09/22   [provider]  warfarin (COUMADIN ) 5 MG tablet Take 1 tablet (5 mg total) by mouth daily. 07/30/24   Kennyth Chew, MD    Allergies: Atorvastatin    Review of Systems  Updated Vital Signs BP (!) 169/81   Pulse 66  Temp 98.1 F (36.7 C) (Oral)   Resp 17   SpO2 100%   Physical Exam Constitutional:      Appearance: Normal appearance.  Neurological:     Mental Status: He is alert.     Comments: NIHSS Exam  Level of Consciousness: Alert  LOC Questions: Answers Month and Age Correctly  LOC Commands: Opens and Closes Eyes and Hands on command  Best Gaze: Horizontal ocular movements intact  Visual Fields: No visual field loss  Facial Palsy: None  L Upper Extremity Motor: No drift after 10 seconds  R Upper Extremity Motor: No drift after 10 seconds  L Lower extremity Motor: No drift after 5 seconds  R Lower extremity Motor: No drift after 5 seconds  Ataxia: Absent  Sensory: Intact sensation to light touch on face,  arms, trunk, and legs bilaterally  Best Language: No aphasia  Dysarthria: No dysarthria  Neglect: No visual or sensory neglect      (all labs ordered are listed, but only abnormal results are displayed) Labs Reviewed  CBC - Abnormal; Notable for the following components:      Result Value   Platelets 133 (*)    All other components within normal limits  COMPREHENSIVE METABOLIC PANEL WITH GFR - Abnormal; Notable for the following components:   Glucose, Bld 116 (*)    BUN 28 (*)    Creatinine, Ser 1.49 (*)    Calcium  8.5 (*)    GFR, Estimated 48 (*)    All other components within normal limits  PROTIME-INR - Abnormal; Notable for the following components:   Prothrombin Time 22.2 (*)    INR 1.8 (*)    All other components within normal limits  I-STAT CHEM 8, ED - Abnormal; Notable for the following components:   BUN 34 (*)    Creatinine, Ser 1.60 (*)    Glucose, Bld 113 (*)    Calcium , Ion 0.93 (*)    All other components within normal limits  CBG MONITORING, ED - Abnormal; Notable for the following components:   Glucose-Capillary 109 (*)    All other components within normal limits  DIFFERENTIAL  ETHANOL  APTT    EKG: EKG Interpretation Date/Time:  Monday September 20 2024 16:25:28 EDT Ventricular Rate:  60 PR Interval:  218 QRS Duration:  83 QT Interval:  456 QTC Calculation: 456 R Axis:   102  Text Interpretation: Sinus rhythm Second deg AVB, Mobitz I (Wenckebach) LAE, consider biatrial enlargement Probable RVH w/ secondary repol abnormality Repol abnrm suggests ischemia, lateral leads Confirmed by Yolande Charleston [phone]) on 09/20/2024 4:50:06 PM  Radiology: CT ANGIO HEAD NECK W WO CM Result Date: 09/20/2024 EXAM: CTA HEAD AND NECK WITH AND WITHOUT 09/20/2024 03:04:00 PM TECHNIQUE: CTA of the head and neck was performed with and without the administration of 75 mL of iohexol  (OMNIPAQUE ) 350 MG/ML injection. Multiplanar 2D and/or 3D reformatted images are provided  for review. Automated exposure control, iterative reconstruction, and/or weight based adjustment of the mA/kV was utilized to reduce the radiation dose to as low as reasonably achievable. Stenosis of the internal carotid arteries measured using NASCET criteria. COMPARISON: CTA head and neck 03/08/2014 and MRI head 02/19/2014 CLINICAL HISTORY: R sided facial numbness. 75ml Omni350; Pt BIB [facility] from home, family reported 5 mins face went numb, possibly had trouble smiling per family at about 1230. Symptoms resolved upon fire arrival, pt denies facial numbness currently but states he feels a warmness on the top of his head that has  been ongoing for awhile. Hx of stroke. FINDINGS: CTA NECK: AORTIC ARCH AND ARCH VESSELS: Atherosclerosis of the visualized aortic arch. No dissection or arterial injury. No significant stenosis of the brachiocephalic or subclavian arteries. CERVICAL CAROTID ARTERIES: No dissection, arterial injury, or hemodynamically significant stenosis by NASCET criteria. CERVICAL VERTEBRAL ARTERIES: There is limited evaluation of the vertebral arteries due to vessel caliber and contrast timing as well as streak artifact. Within these limitations, the visualized intracranial vertebral artery is patent. No dissection, arterial injury, or significant stenosis. LUNGS AND MEDIASTINUM: Unremarkable. SOFT TISSUES: No acute abnormality. BONES: Degenerative changes in the visualized spine. CTA HEAD: ANTERIOR CIRCULATION: The intracranial internal carotid arteries are patent bilaterally. Atherosclerosis of the carotid siphons which contributes to mild stenosis of the bilateral cavernous ICAs. The anterior cerebral arteries are patent proximally. Limited evaluation of distal ACA branches due to contrast bolus timing and artifact. The middle cerebral arteries are patent proximally. There is limited evaluation of distal MCA branches due to contrast bolus timing and artifact. No aneurysm. POSTERIOR CIRCULATION: No  significant stenosis of the posterior cerebral arteries. No significant stenosis of the basilar artery. No significant stenosis of the vertebral arteries. No aneurysm. OTHER: Generalized parenchymal volume loss. Nonspecific hypoattenuation in the periventricular and subcortical white matter, most likely representing chronic microvascular ischemic changes. Remote infarcts in the left corona radiata and left basal ganglia. Possible remote infarct in the right parietal lobe. No dural venous sinus thrombosis on this non-dedicated study. IMPRESSION: 1. Slightly limited evaluation as above, due to contrast bolus timing and streak artifact. Within these limitations, no large vessel occlusion or hemodynamically significant stenosis in the head or neck. 2. Atherosclerosis of the carotid siphons contributing to mild stenosis of the bilateral cavernous ICAs. 3. Generalized parenchymal volume loss and chronic microvascular ischemic changes. 4. Remote infarcts in the left corona radiata and left basal ganglia, with a possible remote infarct in the right parietal lobe. Electronically signed by: Donnice Mania MD 09/20/2024 03:58 PM EDT RP Workstation: [id number]     Procedures   Medications Ordered in the ED  sodium chloride flush (NS) 0.9 % injection 3 mL (3 mLs Intravenous Given 09/20/24 1355)  iohexol  (OMNIPAQUE ) 350 MG/ML injection 75 mL (75 mLs Intravenous Contrast Given 09/20/24 1454)    Clinical Course as of 09/20/24 1708  Mon Sep 20, 2024  1706 Dr Sallyann from neurology consulted for TIA.  Recommends admission. Dr Derinda from hospitalist consulted for admission.  [RP]    Clinical Course User Index [RP] Yolande Lamar BROCKS, MD                                 Medical Decision Making Amount and/or Complexity of Data Reviewed Labs: ordered. Radiology: ordered.  Risk Prescription drug management. Decision regarding hospitalization.   Ivis Henneman is a 78 year old male with a history of hypertension,  seizures, DM, atrial fibrillation on Coumadin , and HTN who presents to the emergency department with transient right sided facial numbness.    Initial Ddx:  Stroke, TIA, Bell's palsy, trigeminal neuralgia  MDM/Course:  Patient presents emergency department with right sided numbness.  Lasted for only 5 minutes.  EMS reported that there possibly was facial weakness as well.  On exam patient has no neurologic deficits.  Has been told that he has TIAs in the past but no history of strokes.  Underwent CTA of the head and neck that did not show any significant vessel disease  but did show several old strokes.  Discussed with neurology who feels the patient should be admitted to the hospital for TIA workup.  Upon re-evaluation patient did not develop any new symptoms.  Of note it appears that he is subtherapeutic on his INR with an INR of 1.8.  MRI may be delayed because of his Medtronic cardiac device.  This patient presents to the ED for concern of complaints listed in HPI, this involves an extensive number of treatment options, and is a complaint that carries with it a high risk of complications and morbidity. Disposition including potential need for admission considered.   Dispo: Admit to Floor  Additional history obtained from spouse Records reviewed Outpatient Clinic Notes The following labs were independently interpreted: Chemistry and show CKD I independently reviewed the following imaging with scope of interpretation limited to determining acute life threatening conditions related to emergency care: CT Head and agree with the radiologist interpretation with the following exceptions: none I personally reviewed and interpreted cardiac monitoring: Mobitz type I I personally reviewed and interpreted the pt's EKG: see above for interpretation  I have reviewed the patients home medications and made adjustments as needed Consults: Neurology Social Determinants of health:  Geriatric  Portions of  this note were generated with Scientist, clinical (histocompatibility and immunogenetics). Dictation errors may occur despite best attempts at proofreading.     Final diagnoses:  Stroke-like symptoms  History of stroke    ED Discharge Orders     None          Yolande Lamar BROCKS, MD 09/20/24 [phone]

## 2024-09-20 NOTE — ED Notes (Signed)
 MRI states patient will be transported in 30 minutes for scan.

## 2024-09-20 NOTE — Assessment & Plan Note (Signed)
-   Follows with neurology; recently seen 09/13/2024.  Stable.  No change in medications at that time - Continues on Vimpat  50 mg twice daily and Dilantin  ER 300 mg at bedtime

## 2024-09-20 NOTE — Assessment & Plan Note (Addendum)
-   Check A1c - On Farxiga , Tresiba  at home - SSI and CBG monitoring as well

## 2024-09-20 NOTE — Progress Notes (Signed)
 Pt admitted from ED with stroke diagnosis, pt alert, c/o of lower back, settled in bed with call light at bedside, tele monitor put and verified on pt, safety concern explained and intiated, was however reassured and will continue to monitor. Obasogie-Asidi, Tupac Jeffus Efe

## 2024-09-20 NOTE — Assessment & Plan Note (Signed)
-   patient has history of CKD3a. Baseline creat ~ 1.2 - 1.4, eGFR~ 48 - 58 - at baseline

## 2024-09-20 NOTE — Hospital Course (Addendum)
 Mr. Hinton is a 78 year old male with PMH CVA, A-flutter/PAF/Mobitz 1, hx syncope (ILR in place), CKD 3 AA, DM II, HTN, HLD, chronic diastolic CHF, OSA on CPAP, seizure disorder, prostate cancer (implanted XRT seeds @ [facility] now follows with [facility]), obesity, gout.  He presented to the ER after developing transient right sided facial numbness.  Episode lasted approximately 5 minutes then spontaneously resolved.  No other notable focal neurodeficits at that time.  Due to concern for stroke, he presented to ER for further evaluation. Case was also discussed with neurology, who recommended admission for TIA/stroke workup.  No reports of seizure activity.  He follows closely with neurology also and recently seen on 09/13/2024 and noted to be doing stable at that time.  No change in medications.

## 2024-09-20 NOTE — Progress Notes (Signed)
 Patient ID: Jason Salazar                 DOB: [birth year]                    MRN: [id number]      HPI: Jason Salazar is a 78 y.o. male patient referred to lipid clinic by Jackee Alberts, NP. PMH is significant for HLD, DM, HTN, OSA, Afib, stage 3 CKD, and hx of prostate of cancer.   Patient presents today for PharmD lipid clinic in good spirits. Patient was previously on rosuvastatin  for management of HLD. Recent labs showed elevated GGT (268). Patient was notified and told that result indicates liver damage possibly related to statin and instructed to discontinue statin for now and recheck GGT in 6 weeks. Patient confirms that he stopped rosuvastatin  when instructed and has held for ~ 2 weeks.   Discussed GGT lab result and discussed that cholesterol medications including statins can cause an elevation in liver enzymes. Discussed with patient, while it is rare for statins to cause GGT elevations, it is possible. Though I would expect concurrent elevations in other LFTs such as ALT and AST and that is not the case here.   Patient reports that he needs to follow a healthier diet and that he is willing to make changes. He is planning on make the changes with his wife so that he has an accountability partner. He currently has no structured exercise regimen.   Reviewed options for lowering LDL cholesterol, including PCSK-9 inhibitors, bempedoic acid and inclisiran.  Discussed mechanisms of action, dosing, side effects and potential decreases in LDL cholesterol.  Also reviewed cost information and potential options for patient assistance.   Current Medications: none Intolerances: atorvastatin (lightheadedness)  Risk Factors: DM, HTN, family hx of heart disease LDL goal: < 70; Trig goal: < 150 mg/dL Lipid panel (0/7974): Chol 208, Trig 179, HDL 61, LDL 116 mg/dL Hepatic panel (0/7974): ALT: 26; AST: 18; Alk phos: 140 IU /L GGT (08/2024): 268 IU /L  Diet: Breakfast: eggs, [place]  bacon Lunch/Dinner: roasted chicken, fried chicken occasionally Snacks: enjoys sweet treats frequently  Drinks: drinks ~ 1-2 bottles of water daily   Exercise: no structured exercise regimen  Family History:  Relation Problem Comments  Mother (Deceased at age 65) CVA     Father (Deceased at age 99) Prostate cancer   Thyroid  cancer     Brother (Alive) Sleep apnea      Social History:  Alcohol: none Smoking: none  Labs:  Lipid Panel     Component Value Date/Time   CHOL 208 (H) 08/31/2024 1213   TRIG 179 (H) 08/31/2024 1213   HDL 61 08/31/2024 1213   CHOLHDL 3.4 08/31/2024 1213   CHOLHDL 3.2 03/19/2021 0913   LDLCALC 116 (H) 08/31/2024 1213   LDLCALC 83 03/19/2021 0913   LABVLDL 31 08/31/2024 1213    Past Medical History:  Diagnosis Date   Allergic rhinitis    Balantidiasis    Cancer (HCC)    prostate cancer treated 2007 with radiation seed placement   Diabetes (HCC)    H/O prostate cancer    Radioactive seeds   Hypercholesteremia    Hyperlipidemia    Hyperlipoproteinemia    Hypertension    Sleep apnea     No current facility-administered medications on file prior to visit.   Current Outpatient Medications on File Prior to Visit  Medication Sig Dispense Refill   allopurinol  (ZYLOPRIM ) 100 MG tablet Take 100  mg by mouth 2 (two) times daily as needed (for gout flares).     aspirin EC 81 MG tablet Take 81 mg by mouth in the morning.     b complex vitamins tablet Take 1 tablet by mouth daily.     bimatoprost  (LUMIGAN ) 0.01 % SOLN Instill 1 drop into both eyes at bedtime 7.5 mL 6   Blood Pressure Monitoring (BLOOD PRESSURE CUFF) MISC Use to check blood pressure daily. 1 each 0   Cholecalciferol (VITAMIN D3) 1000 units CAPS Take 1,000 Units by mouth daily.     Colchicine  0.6 MG CAPS Take 1 capsule by mouth 2 (two) times daily as needed for gout flare up 30 capsule 2   Continuous Glucose Sensor (FREESTYLE LIBRE 3 PLUS SENSOR) MISC Change sensor every 15 days. 6  each 1   dapagliflozin  propanediol (FARXIGA ) 10 MG TABS tablet TAKE 1 TABLET BY MOUTH EVERY DAY 90 tablet 1   DILANTIN  100 MG ER capsule Take 3 capsules (300 mg total) by mouth at bedtime. 90 capsule 12   furosemide  (LASIX ) 20 MG tablet Take 1 tablet (20 mg total) by mouth daily. 90 tablet 3   insulin  degludec (TRESIBA ) 100 UNIT/ML FlexTouch Pen Inject 22 Units into the skin at bedtime. 15 mL 1   lacosamide  (VIMPAT ) 50 MG TABS tablet Take 1 tablet (50 mg total) by mouth 2 (two) times daily. (Patient taking differently: Take 50 mg by mouth See admin instructions. Take 50 mg by mouth in the morning and evening) 60 tablet 5   losartan  (COZAAR ) 25 MG tablet Take 1 tablet (25 mg total) by mouth daily. 90 tablet 1   mometasone (NASONEX) 50 MCG/ACT nasal spray Place 2 sprays into the nose daily as needed (for seasonal allergies).     Multiple Vitamins-Minerals (ONE-A-DAY MENS 50+ ADVANTAGE PO) Take 1 tablet by mouth daily with breakfast.     tiZANidine (ZANAFLEX) 4 MG tablet Take 4 mg by mouth 2 (two) times daily as needed (for pain).     Vitamin C 250 MG TABS Take 250 mg by mouth daily.     warfarin (COUMADIN ) 5 MG tablet Take 1 tablet (5 mg total) by mouth daily. (Patient taking differently: Take 2.5-5 mg by mouth See admin instructions. Take 2.5 mg by mouth in the morning on Sun/Tues/Thurs/Sat and 5 mg on Mon/Wed/Fri) 30 tablet 11    Allergies  Allergen Reactions   Atorvastatin Other (See Comments)    Light-headedness    Assessment/Plan:  1. Hyperlipidemia -  Problem  Mixed Hyperlipidemia   Mixed hyperlipidemia Assessment:  LDL goal: < 70 mg/dl; last LDLc 883 mg/dl (0/7974) Rosuvastatin  is currently on hold due to elevated GGT (268); Patient tolerates rosuvastatin  otherwise AST/ALT wnl; Alk phos mildly elevated Will avoid initiating statins, ezetimibe or bempedoic acid at this time due to elevated GGT. Though I would expect concurrent elevations in other LFTs such as ALT and AST if statin  was the cause Discussed next potential options (PCSK-9 inhibitors inclisiran); cost, dosing efficacy, side effects  Discussed lifestyle modifications (diet and exercise); Provided and discussed Planning Healthy Meals diet and Lowering LDL handouts - Patient aware to keep vitamin K foods consistent with warfarin use  Plan: Will apply for PA for PCSK9i; will inform patient upon approval  Lipid lab due in 3 months after starting PCSK9i  Instructed patient to have GGT rechecked ~ 6 weeks from when he discontinued rosuvastatin  as instructed by cardiology provider   Thank you,  Demetric Dunnaway E. Greyson Riccardi, Pharm.D  [facility] [address], [place], [place] [address] Phone: [phone]; Fax: [phone]

## 2024-09-20 NOTE — ED Notes (Signed)
 Patient transported to MRI

## 2024-09-20 NOTE — Assessment & Plan Note (Signed)
 Continue allopurinol 

## 2024-09-20 NOTE — Assessment & Plan Note (Signed)
-   Continue losartan  and Lasix

## 2024-09-20 NOTE — Assessment & Plan Note (Signed)
-   LDL 172 - Noted allergy to Lipitor (lightheadedness) - Undergoing approval for starting Repatha outpatient

## 2024-09-20 NOTE — Patient Instructions (Addendum)
 Your Results:             Your most recent labs Goal  Total Cholesterol 208 < 200  Triglycerides 179 < 150  HDL (happy/good cholesterol) 61 > 40  LDL (lousy/bad cholesterol 116 < 70   Medication changes: Continue to hold rosuvastatin   Repeat GGT (liver enzyme test) ~ November 10th   We will start the process to Repatha or Praluent get covered by your insurance.  Once the prior authorization is complete, we will call you to let you know and confirm pharmacy information.     Praluent is a cholesterol medication that improved your body's ability to get rid of bad cholesterol known as LDL. It can lower your LDL up to 60%. It is an injection that is given under the skin every 2 weeks. The most common side effects of Praluent include runny nose, symptoms of the common cold, rarely flu or flu-like symptoms, back/muscle pain in about 3-4% of the patients, and redness, pain, or bruising at the injection site.    Repatha is a cholesterol medication that improved your body's ability to get rid of bad cholesterol known as LDL. It can lower your LDL up to 60%! It is an injection that is given under the skin every 2 weeks. The medication often requires a prior authorization from your insurance company. We will take care of submitting all the necessary information to your insurance company to get it approved. The most common side effects of Repatha include runny nose, symptoms of the common cold, rarely flu or flu-like symptoms, back/muscle pain in about 3-4% of the patients, and redness, pain, or bruising at the injection site.  Lab orders: We want to repeat labs after 3 months.  You will return in 3 months to receive a lipid panel. Please fast for these labs.    Seymour Pavlak E. Therasa Lorenzi, Pharm.D [facility] [address], [place], [place] [address] Phone: [phone]; Fax: [phone]

## 2024-09-20 NOTE — Assessment & Plan Note (Addendum)
-   No S/S exacerbation - Last echo 12/12/2022: EF 60 to 65%, no RWMA, mild LVH, grade 1 DD - On Lasix  daily at home

## 2024-09-21 ENCOUNTER — Ambulatory Visit: Payer: Medicare Other | Admitting: Family Medicine

## 2024-09-21 ENCOUNTER — Observation Stay (HOSPITAL_COMMUNITY)

## 2024-09-21 DIAGNOSIS — R200 Anesthesia of skin: Secondary | ICD-10-CM | POA: Diagnosis not present

## 2024-09-21 DIAGNOSIS — G459 Transient cerebral ischemic attack, unspecified: Secondary | ICD-10-CM

## 2024-09-21 DIAGNOSIS — I480 Paroxysmal atrial fibrillation: Secondary | ICD-10-CM | POA: Diagnosis not present

## 2024-09-21 DIAGNOSIS — G40909 Epilepsy, unspecified, not intractable, without status epilepticus: Secondary | ICD-10-CM | POA: Diagnosis not present

## 2024-09-21 LAB — GLUCOSE, CAPILLARY
Glucose-Capillary: 105 mg/dL — ABNORMAL HIGH (ref 70–99)
Glucose-Capillary: 101 mg/dL — ABNORMAL HIGH (ref 70–99)

## 2024-09-21 LAB — BASIC METABOLIC PANEL WITH GFR
Anion gap: 11 (ref 5–15)
BUN: 25 mg/dL — ABNORMAL HIGH (ref 8–23)
CO2: 23 mmol/L (ref 22–32)
Calcium: 8.5 mg/dL — ABNORMAL LOW (ref 8.9–10.3)
Chloride: 103 mmol/L (ref 98–111)
Creatinine, Ser: 1.36 mg/dL — ABNORMAL HIGH (ref 0.61–1.24)
GFR, Estimated: 54 mL/min — ABNORMAL LOW (ref >60)
Glucose, Bld: 119 mg/dL — ABNORMAL HIGH (ref 70–99)
Potassium: 4.2 mmol/L (ref 3.5–5.1)
Sodium: 137 mmol/L (ref 135–145)

## 2024-09-21 LAB — ECHOCARDIOGRAM COMPLETE
Area-P 1/2: 4.83 cm2
Calc EF: 60 %
MV VTI: 2.04 cm2
S' Lateral: 2.6 cm
Single Plane A2C EF: 58.5 %
Single Plane A4C EF: 64 %

## 2024-09-21 LAB — LIPID PANEL
Cholesterol: 266 mg/dL — ABNORMAL HIGH (ref 0–200)
HDL: 53 mg/dL (ref >40)
LDL Cholesterol: 172 mg/dL — ABNORMAL HIGH (ref 0–99)
Total CHOL/HDL Ratio: 5 ratio
Triglycerides: 207 mg/dL — ABNORMAL HIGH (ref <150)
VLDL: 41 mg/dL — ABNORMAL HIGH (ref 0–40)

## 2024-09-21 LAB — CBC
HCT: 47.7 % (ref 39.0–52.0)
Hemoglobin: 15 g/dL (ref 13.0–17.0)
MCH: 27.9 pg (ref 26.0–34.0)
MCHC: 31.4 g/dL (ref 30.0–36.0)
MCV: 88.7 fL (ref 80.0–100.0)
Platelets: 128 K/uL — ABNORMAL LOW (ref 150–400)
RBC: 5.38 MIL/uL (ref 4.22–5.81)
RDW: 14.3 % (ref 11.5–15.5)
WBC: 5.8 K/uL (ref 4.0–10.5)
nRBC: 0 % (ref 0.0–0.2)

## 2024-09-21 LAB — PROTIME-INR
INR: 2 — ABNORMAL HIGH (ref 0.8–1.2)
Prothrombin Time: 23.6 s — ABNORMAL HIGH (ref 11.4–15.2)

## 2024-09-21 MED ORDER — WARFARIN SODIUM 2.5 MG PO TABS
2.5000 mg | ORAL_TABLET | Freq: Once | ORAL | Status: DC
  Filled 2024-09-21: qty 1

## 2024-09-21 NOTE — Progress Notes (Signed)
 PHARMACY - ANTICOAGULATION CONSULT NOTE  Pharmacy Consult for warfarin Indication: atrial fibrillation  Allergies  Allergen Reactions   Atorvastatin Other (See Comments)    Light-headedness   Patient Measurements:    Vital Signs: Temp: 98.1 F (36.7 C) (10/14 0404) Temp Source: Oral (10/14 0404) BP: 153/67 (10/14 0404) Pulse Rate: 58 (10/14 0404)  Labs: Recent Labs    09/20/24 1353 09/20/24 1405 09/20/24 1415 09/21/24 0128  HGB 15.5 17.0  --  15.0  HCT 49.7 50.0  --  47.7  PLT 133*  --   --  128*  APTT  --   --  32  --   LABPROT  --   --  22.2* 23.6*  INR  --   --  1.8* 2.0*  CREATININE 1.49* 1.60*  --  1.36*    Estimated Creatinine Clearance: 43.8 mL/min (A) (by C-G formula based on SCr of 1.36 mg/dL (H)).   Medical History: Past Medical History:  Diagnosis Date   Allergic rhinitis    Balantidiasis    Cancer (HCC)    prostate cancer treated 2007 with radiation seed placement   Diabetes (HCC)    H/O prostate cancer    Radioactive seeds   Hypercholesteremia    Hyperlipidemia    Hyperlipoproteinemia    Hypertension    Sleep apnea     Medications:  Scheduled:   allopurinol   100 mg Oral BID   aspirin EC  81 mg Oral Daily   dapagliflozin  propanediol  10 mg Oral Daily   furosemide   20 mg Oral Daily   insulin  aspart  0-5 Units Subcutaneous QHS   insulin  aspart  0-9 Units Subcutaneous TID WC   insulin  glargine  22 Units Subcutaneous QHS   lacosamide   50 mg Oral BID   latanoprost  1 drop Both Eyes QHS   losartan   25 mg Oral Daily   phenytoin   300 mg Oral QHS   Warfarin - Pharmacist Dosing Inpatient   Does not apply q1600  Infusions:   Assessment: Patient is a 78 YO male who presented to the ED with facial numbness and trouble smiling which resolved after 5 minutes. Neurology consulted for possible TIA workup. Patient also on warfarin PTA for atrial fibrillation.  Last outpatient INR was 1.8 on 10/2 but was historically well controlled on this regimen.  Notably, patient takes Dilantin  PTA, but dose has remained stable at 3 x 100mg  tablets nightly (confirmed dosing with patient's wife).   PTA Warfarin regimen: 5 mg tablets taken as 1/2 tablet daily EXCEPT 1 tablet on Mondays, Wednesdays, and Fridays. (INR on admission 1.8, Last dose morning of 10/13)   10/14: INR at goal - 2.0. CBC stable/WNL.   Goal of Therapy:  INR 2-3 Monitor platelets by anticoagulation protocol: Yes   Plan:  - Warfarin 2.5 mg PO x1 today per PTA regimen  -Monitor INR, CBC daily.   Massie Fila, PharmD Clinical Pharmacist  09/21/2024 7:28 AM

## 2024-09-21 NOTE — Progress Notes (Signed)
 Communicated with the patient via Caregility. Patient appears comfortable in the bed. AVS paperwork given to patient. Discharge instructions discussed with patient and spouse. Patient's question answered to satisfaction. Patient agreeable with discharge plan.

## 2024-09-21 NOTE — Discharge Summary (Signed)
 Physician Discharge Summary   Jefrey Raburn FMW:[id number] DOB: Mar 05, 1946 DOA: 09/20/2024  PCP: Vicci Barnie NOVAK, MD  Admit date: 09/20/2024 Discharge date: 09/21/2024  Admitted From: Home Disposition: Home Discharging physician: Alm Apo, MD Barriers to discharge: None  Recommendations at discharge: Follow-up with neurology as previously scheduled  Discharge Condition: stable CODE STATUS: Full Diet recommendation:  Diet Orders (From admission, onward)     Start     Ordered   09/21/24 0000  Diet general        09/21/24 1224   09/20/24 1747  Diet regular Fluid consistency: Thin  Diet effective now       Question:  Fluid consistency:  Answer:  Thin   09/20/24 1746            Hospital Course: Mr. Diskin is a 78 year old male with PMH CVA, A-flutter/PAF/Mobitz 1, hx syncope (ILR in place), CKD 3 AA, DM II, HTN, HLD, chronic diastolic CHF, OSA on CPAP, seizure disorder, prostate cancer (implanted XRT seeds @ [facility] now follows with [facility]), obesity, gout.  He presented to the ER after developing transient right sided facial numbness.  Episode lasted approximately 5 minutes then spontaneously resolved.  No other notable focal neurodeficits at that time.  Due to concern for stroke, he presented to ER for further evaluation. Case was also discussed with neurology, who recommended admission for TIA/stroke workup.  No reports of seizure activity.  He follows closely with neurology also and recently seen on 09/13/2024 and noted to be doing stable at that time.  No change in medications.  Assessment and Plan: * TIA (transient ischemic attack) - hx CVA - CT angio head/neck notes remote infarcts involving left corona radiata, left BG, and possible remote infarct right parietal lobe -Now presenting with very transient episode right facial numbness; neurology concerned for potential TIA -Limited evaluation on CTA head/neck due to contrast bolus timing but no hemodynamically  significant stenosis noted in head or neck - LDL 172; see hyperlipidemia -On aspirin and Coumadin  at home.  - MRI brain negative for acute findings.  Remote strokes noted -Neurology evaluated, appreciate assistance - Echo also obtained, no acute findings  PAF (paroxysmal atrial fibrillation) (HCC) - Follows with cardiology and Coumadin  clinic - Rate controlled without medication - INR subtherapeutic on admission, 1.8; historically INRs seem to be well-controlled - Coumadin  managed by pharmacy during hospitalization and patient continued on home regimen at discharge  Epilepsy [facility]) - Follows with neurology; recently seen 09/13/2024.  Stable.  No change in medications at that time - Continues on Vimpat  50 mg twice daily and Dilantin  ER 300 mg at bedtime  History of syncope - Follows with cardiology - Has [employer]onic ILR in place  Chronic diastolic CHF (congestive heart failure) (HCC) - No S/S exacerbation - Last echo 12/12/2022: EF 60 to 65%, no RWMA, mild LVH, grade 1 DD - On Lasix  daily at home  Diabetes (HCC) - A1c 7.5% - On Farxiga , Tresiba  at home  History of gout - Continue allopurinol   History of CVA (cerebrovascular accident) CT angio head/neck notes remote infarcts involving left corona radiata, left BG, and possible remote infarct right parietal lobe - On aspirin and Coumadin  (latter for A-fib) - Not on statin  Benign essential hypertension - Continue losartan  and Lasix   Stage 3a chronic kidney disease (HCC) - patient has history of CKD3a. Baseline creat ~ 1.2 - 1.4, eGFR~ 48 - 58 - at baseline    Hypercholesteremia - LDL 172 - Noted allergy to Lipitor (lightheadedness) -  Undergoing approval for starting Repatha outpatient  Obstructive sleep apnea - Continue nightly CPAP       The patient's acute and chronic medical conditions were treated accordingly. On day of discharge, patient was felt deemed stable for discharge. Patient/family member advised to call  PCP or come back to ER if needed.   Principal Diagnosis: TIA (transient ischemic attack)  Discharge Diagnoses: Active Hospital Problems   Diagnosis Date Noted   TIA (transient ischemic attack) 09/20/2024    Priority: 1.   PAF (paroxysmal atrial fibrillation) (HCC) 02/04/2024    Priority: 2.   Epilepsy (HCC) 04/19/2019    Priority: 3.   History of syncope 09/24/2023    Priority: 4.   Chronic diastolic CHF (congestive heart failure) (HCC) 09/20/2024    Priority: 5.   Diabetes (HCC)     Priority: 6.   History of CVA (cerebrovascular accident) 09/20/2024   History of gout 09/20/2024   Benign essential hypertension 09/13/2024   Stage 3a chronic kidney disease (HCC) 10/09/2021   Hypercholesteremia    Obstructive sleep apnea 05/29/2013    Resolved Hospital Problems  No resolved problems to display.     Discharge Instructions     Diet general   Complete by: As directed    Increase activity slowly   Complete by: As directed       Allergies as of 09/21/2024       Reactions   Atorvastatin Other (See Comments)   Light-headedness        Medication List     TAKE these medications    allopurinol  100 MG tablet Commonly known as: ZYLOPRIM  Take 100 mg by mouth 2 (two) times daily as needed (for gout flares).   aspirin EC 81 MG tablet Take 81 mg by mouth in the morning.   b complex vitamins tablet Take 1 tablet by mouth daily.   Blood Pressure Cuff Misc Use to check blood pressure daily.   Colchicine  0.6 MG Caps Take 1 capsule by mouth 2 (two) times daily as needed for gout flare up   Dilantin  100 MG ER capsule Generic drug: phenytoin  Take 3 capsules (300 mg total) by mouth at bedtime.   Farxiga  10 MG Tabs tablet Generic drug: dapagliflozin  propanediol TAKE 1 TABLET BY MOUTH EVERY DAY   FreeStyle Libre 3 Plus Sensor Misc Change sensor every 15 days.   furosemide  20 MG tablet Commonly known as: LASIX  Take 1 tablet (20 mg total) by mouth daily.    ibuprofen 800 MG tablet Commonly known as: ADVIL Take 800 mg by mouth every 8 (eight) hours as needed (for pain).   insulin  degludec 100 UNIT/ML FlexTouch Pen Commonly known as: TRESIBA  Inject 22 Units into the skin at bedtime.   lacosamide  50 MG Tabs tablet Commonly known as: Vimpat  Take 1 tablet (50 mg total) by mouth 2 (two) times daily. What changed:  when to take this additional instructions   losartan  25 MG tablet Commonly known as: COZAAR  Take 1 tablet (25 mg total) by mouth daily.   Lumigan  0.01 % Soln Generic drug: bimatoprost  Instill 1 drop into both eyes at bedtime   mometasone 50 MCG/ACT nasal spray Commonly known as: NASONEX Place 2 sprays into the nose daily as needed (for seasonal allergies).   ONE-A-DAY MENS 50+ ADVANTAGE PO Take 1 tablet by mouth daily with breakfast.   PREVAGEN PO Take 1 tablet by mouth See admin instructions. Prevagen chewable tablets - Chew 1 tablet by mouth once a day   tiZANidine  4 MG tablet Commonly known as: ZANAFLEX Take 4 mg by mouth 2 (two) times daily as needed (for pain).   Vitamin C 250 MG Tabs Take 250 mg by mouth daily.   Vitamin D3 1000 units Caps Take 1,000 Units by mouth daily.   warfarin 5 MG tablet Commonly known as: COUMADIN  Take as directed. If you are unsure how to take this medication, talk to your nurse or doctor. Original instructions: Take 1 tablet (5 mg total) by mouth daily. What changed:  how much to take when to take this additional instructions        Allergies  Allergen Reactions   Atorvastatin Other (See Comments)    Light-headedness    Consultations: Neurology  Procedures:   Discharge Exam: BP (!) 170/82 (BP Location: Right Arm)   Pulse 67   Temp 98 F (36.7 C) (Oral)   Resp 16   SpO2 99%  Physical Exam Constitutional:      General: He is not in acute distress.    Appearance: Normal appearance.  HENT:     Head: Normocephalic and atraumatic.     Mouth/Throat:      Mouth: Mucous membranes are moist.  Eyes:     Extraocular Movements: Extraocular movements intact.  Cardiovascular:     Rate and Rhythm: Normal rate and regular rhythm.  Pulmonary:     Effort: Pulmonary effort is normal. No respiratory distress.     Breath sounds: Normal breath sounds. No wheezing.  Abdominal:     General: Bowel sounds are normal. There is no distension.     Palpations: Abdomen is soft.     Tenderness: There is no abdominal tenderness.  Musculoskeletal:        General: Normal range of motion.     Cervical back: Normal range of motion and neck supple.  Skin:    General: Skin is warm and dry.  Neurological:     General: No focal deficit present.     Mental Status: He is alert.  Psychiatric:        Mood and Affect: Mood normal.        Behavior: Behavior normal.      The results of significant diagnostics from this hospitalization (including imaging, microbiology, ancillary and laboratory) are listed below for reference.   Microbiology: No results found for this or any previous visit (from the past 240 hours).   Labs: BNP (last 3 results) No results for input(s): BNP in the last 8760 hours. Basic Metabolic Panel: Recent Labs  Lab 09/20/24 1353 09/20/24 1405 09/21/24 0128  NA 135 137 137  K 4.5 4.6 4.2  CL 102 108 103  CO2 22  --  23  GLUCOSE 116* 113* 119*  BUN 28* 34* 25*  CREATININE 1.49* 1.60* 1.36*  CALCIUM  8.5*  --  8.5*   Liver Function Tests: Recent Labs  Lab 09/20/24 1353  AST 21  ALT 30  ALKPHOS 112  BILITOT 0.6  PROT 6.9  ALBUMIN 3.8   No results for input(s): LIPASE, AMYLASE in the last 168 hours. No results for input(s): AMMONIA in the last 168 hours. CBC: Recent Labs  Lab 09/20/24 1353 09/20/24 1405 09/21/24 0128  WBC 5.3  --  5.8  NEUTROABS 3.3  --   --   HGB 15.5 17.0 15.0  HCT 49.7 50.0 47.7  MCV 90.7  --  88.7  PLT 133*  --  128*   Cardiac Enzymes: No results for input(s): CKTOTAL, CKMB, CKMBINDEX,  TROPONINI  in the last 168 hours. BNP: Invalid input(s): POCBNP CBG: Recent Labs  Lab 09/20/24 1352 09/20/24 2220 09/21/24 0614 09/21/24 1124  GLUCAP 109* 140* 105* 101*   D-Dimer No results for input(s): DDIMER in the last 72 hours. Hgb A1c Recent Labs    09/20/24 1350  HGBA1C 7.5*   Lipid Profile Recent Labs    09/21/24 0128  CHOL 266*  HDL 53  LDLCALC 172*  TRIG 207*  CHOLHDL 5.0   Thyroid  function studies No results for input(s): TSH, T4TOTAL, T3FREE, THYROIDAB in the last 72 hours.  Invalid input(s): FREET3 Anemia work up No results for input(s): VITAMINB12, FOLATE, FERRITIN, TIBC, IRON, RETICCTPCT in the last 72 hours. Urinalysis No results found for: COLORURINE, APPEARANCEUR, LABSPEC, PHURINE, GLUCOSEU, HGBUR, BILIRUBINUR, KETONESUR, PROTEINUR, UROBILINOGEN, NITRITE, LEUKOCYTESUR Sepsis Labs Recent Labs  Lab 09/20/24 1353 09/21/24 0128  WBC 5.3 5.8   Microbiology No results found for this or any previous visit (from the past 240 hours).  Procedures/Studies: ECHOCARDIOGRAM COMPLETE Result Date: 09/21/2024    ECHOCARDIOGRAM REPORT   Patient Name:   PAVLOS YON Date of Exam: 09/21/2024 Medical Rec #:  [id number]       Height:       63.0 in Accession #:    [id number]      Weight:       181.0 lb Date of Birth:  20-Dec-1945      BSA:          1.853 m Patient Age:    77 years        BP:           126/60 mmHg Patient Gender: M               HR:           66 bpm. Exam Location:  Inpatient Procedure: 2D Echo, Strain Analysis, Saline Contrast Bubble Study, Cardiac            Doppler and Color Doppler (Both Spectral and Color Flow Doppler were            utilized during procedure). Indications:    TIA  History:        Patient has prior history of Echocardiogram examinations, most                 recent 12/12/2022. CHF, Abnormal ECG, Arrythmias:Atrial                 Fibrillation, AV Block and Atrial Flutter,                  Signs/Symptoms:Syncope and Chest Pain; Risk Factors:Sleep Apnea,                 Dyslipidemia and Hypertension.  Sonographer:    Ellouise Mose RDCS Referring Phys: [phone] Hershey Knauer  Sonographer Comments: Technically difficult study due to poor echo windows. IMPRESSIONS  1. Left ventricular ejection fraction, by estimation, is 60 to 65%. Left ventricular ejection fraction by 2D MOD biplane is 60.0 %. Left ventricular ejection fraction by PLAX is 63 %. The left ventricle has normal function. The left ventricle has no regional wall motion abnormalities. There is moderate concentric left ventricular hypertrophy. Left ventricular diastolic parameters are indeterminate.  2. Right ventricular systolic function is normal. The right ventricular size is normal. Tricuspid regurgitation signal is inadequate for assessing PA pressure.  3. The mitral valve is grossly normal. Trivial mitral valve regurgitation. No evidence of mitral stenosis.  4. The aortic valve was  not well visualized. Aortic valve regurgitation is not visualized. No aortic stenosis is present.  5. The inferior vena cava is normal in size with greater than 50% respiratory variability, suggesting right atrial pressure of 3 mmHg.  6. Agitated saline contrast bubble study was negative, with no evidence of any interatrial shunt. Comparison(s): No prior Echocardiogram. FINDINGS  Left Ventricle: Left ventricular ejection fraction, by estimation, is 60 to 65%. Left ventricular ejection fraction by PLAX is 63 %. Left ventricular ejection fraction by 2D MOD biplane is 60.0 %. The left ventricle has normal function. The left ventricle has no regional wall motion abnormalities. Global longitudinal strain performed but not reported based on interpreter judgement due to suboptimal tracking. The left ventricular internal cavity size was normal in size. There is moderate concentric left ventricular hypertrophy. Left ventricular diastolic parameters are indeterminate. Right  Ventricle: The right ventricular size is normal. No increase in right ventricular wall thickness. Right ventricular systolic function is normal. Tricuspid regurgitation signal is inadequate for assessing PA pressure. Left Atrium: Left atrial size was normal in size. Right Atrium: Right atrial size was normal in size. Pericardium: There is no evidence of pericardial effusion. Presence of epicardial fat layer. Mitral Valve: The mitral valve is grossly normal. Trivial mitral valve regurgitation. No evidence of mitral valve stenosis. MV peak gradient, 6.2 mmHg. The mean mitral valve gradient is 4.0 mmHg. Tricuspid Valve: The tricuspid valve is normal in structure. Tricuspid valve regurgitation is not demonstrated. No evidence of tricuspid stenosis. Aortic Valve: The aortic valve was not well visualized. Aortic valve regurgitation is not visualized. No aortic stenosis is present. Pulmonic Valve: The pulmonic valve was normal in structure. Pulmonic valve regurgitation is not visualized. No evidence of pulmonic stenosis. Aorta: The aortic root and ascending aorta are structurally normal, with no evidence of dilitation. Venous: The right lower pulmonary vein is normal. The inferior vena cava is normal in size with greater than 50% respiratory variability, suggesting right atrial pressure of 3 mmHg. IAS/Shunts: No atrial level shunt detected by color flow Doppler. Agitated saline contrast was given intravenously to evaluate for intracardiac shunting. Agitated saline contrast bubble study was negative, with no evidence of any interatrial shunt.  LEFT VENTRICLE PLAX 2D                        Biplane EF (MOD) LV EF:         Left            LV Biplane EF:   Left                ventricular                      ventricular                ejection                         ejection                fraction by                      fraction by                PLAX is 63                       2D MOD                %.  biplane is LVIDd:         3.90 cm                          60.0 %. LVIDs:         2.60 cm LV PW:         1.70 cm LV IVS:        1.40 cm         2D Longitudinal LVOT diam:     1.90 cm         Strain LV SV:         53              2D Strain GLS   -22.3 % LV SV Index:   29              Avg: LVOT Area:     2.84 cm LV IVRT:       81 msec  LV Volumes (MOD) LV vol d, MOD    87.0 ml A2C: LV vol d, MOD    64.4 ml A4C: LV vol s, MOD    36.1 ml A2C: LV vol s, MOD    23.2 ml A4C: LV SV MOD A2C:   50.9 ml LV SV MOD A4C:   64.4 ml LV SV MOD BP:    48.0 ml RIGHT VENTRICLE             IVC RV S prime:     12.70 cm/s  IVC diam: 1.30 cm TAPSE (M-mode): 1.8 cm LEFT ATRIUM             Index        RIGHT ATRIUM           Index LA diam:        3.50 cm 1.89 cm/m   RA Area:     11.60 cm LA Vol (A2C):   34.9 ml 18.83 ml/m  RA Volume:   20.80 ml  11.22 ml/m LA Vol (A4C):   47.7 ml 25.74 ml/m LA Biplane Vol: 42.3 ml 22.82 ml/m  AORTIC VALVE LVOT Vmax:   96.10 cm/s LVOT Vmean:  60.900 cm/s LVOT VTI:    0.188 m  AORTA Ao Root diam: 3.20 cm Ao Asc diam:  3.30 cm MITRAL VALVE MV Area (PHT): 4.83 cm     SHUNTS MV Area VTI:   2.04 cm     Systemic VTI:  0.19 m MV Peak grad:  6.2 mmHg     Systemic Diam: 1.90 cm MV Mean grad:  4.0 mmHg MV Vmax:       1.24 m/s MV Vmean:      92.0 cm/s MV Decel Time: 157 msec MV E velocity: 120.10 cm/s MV A velocity: 124.00 cm/s MV E/A ratio:  0.97 Darryle Decent MD Electronically signed by Darryle Decent MD Signature Date/Time: 09/21/2024/12:15:22 PM    Final    MR BRAIN WO CONTRAST Result Date: 09/20/2024 EXAM: MR Brain without Intravenous Contrast. CLINICAL HISTORY: transient facial numbness. TECHNIQUE: Magnetic resonance images of the brain without intravenous contrast in multiple planes. CONTRAST: Without. COMPARISON: Same day CTA head and neck. FINDINGS: BRAIN: No restricted diffusion to indicate acute infarction. No intracranial mass or hemorrhage. No midline shift or extra-axial fluid  collection. No cerebellar tonsillar ectopia. The central arterial and venous flow voids are patent. T2 FLAIR hyperintensity in the periventricular and subcortical white matter compatible with mild chronic microvascular ischemic changes. Remote lacunar  infarcts in the bilateral basal ganglia and in the left corona radiata. Remote cortical infarct in the right parieto-occipital lobes. Mild parenchymal volume loss. VENTRICLES: No hydrocephalus. ORBITS: The orbits are normal. SINUSES AND MASTOIDS: The sinuses and mastoid air cells are clear. BONES: No acute fracture or focal osseous lesion. IMPRESSION: 1. No acute findings. 2. Remote lacunar infarcts in the bilateral basal ganglia and in the left corona radiata. 3. Mild chronic microvascular ischemic changes. 4. Remote cortical infarct in the right parieto-occipital lobes. 5. Mild parenchymal volume loss. Electronically signed by: Donnice Mania MD 09/20/2024 06:55 PM EDT RP Workstation: [id number]   CT ANGIO HEAD NECK W WO CM Result Date: 09/20/2024 EXAM: CTA HEAD AND NECK WITH AND WITHOUT 09/20/2024 03:04:00 PM TECHNIQUE: CTA of the head and neck was performed with and without the administration of 75 mL of iohexol  (OMNIPAQUE ) 350 MG/ML injection. Multiplanar 2D and/or 3D reformatted images are provided for review. Automated exposure control, iterative reconstruction, and/or weight based adjustment of the mA/kV was utilized to reduce the radiation dose to as low as reasonably achievable. Stenosis of the internal carotid arteries measured using NASCET criteria. COMPARISON: CTA head and neck 03/08/2014 and MRI head 02/19/2014 CLINICAL HISTORY: R sided facial numbness. 75ml Omni350; Pt BIB [facility] from home, family reported 5 mins face went numb, possibly had trouble smiling per family at about 1230. Symptoms resolved upon fire arrival, pt denies facial numbness currently but states he feels a warmness on the top of his head that has been ongoing for awhile. Hx of  stroke. FINDINGS: CTA NECK: AORTIC ARCH AND ARCH VESSELS: Atherosclerosis of the visualized aortic arch. No dissection or arterial injury. No significant stenosis of the brachiocephalic or subclavian arteries. CERVICAL CAROTID ARTERIES: No dissection, arterial injury, or hemodynamically significant stenosis by NASCET criteria. CERVICAL VERTEBRAL ARTERIES: There is limited evaluation of the vertebral arteries due to vessel caliber and contrast timing as well as streak artifact. Within these limitations, the visualized intracranial vertebral artery is patent. No dissection, arterial injury, or significant stenosis. LUNGS AND MEDIASTINUM: Unremarkable. SOFT TISSUES: No acute abnormality. BONES: Degenerative changes in the visualized spine. CTA HEAD: ANTERIOR CIRCULATION: The intracranial internal carotid arteries are patent bilaterally. Atherosclerosis of the carotid siphons which contributes to mild stenosis of the bilateral cavernous ICAs. The anterior cerebral arteries are patent proximally. Limited evaluation of distal ACA branches due to contrast bolus timing and artifact. The middle cerebral arteries are patent proximally. There is limited evaluation of distal MCA branches due to contrast bolus timing and artifact. No aneurysm. POSTERIOR CIRCULATION: No significant stenosis of the posterior cerebral arteries. No significant stenosis of the basilar artery. No significant stenosis of the vertebral arteries. No aneurysm. OTHER: Generalized parenchymal volume loss. Nonspecific hypoattenuation in the periventricular and subcortical white matter, most likely representing chronic microvascular ischemic changes. Remote infarcts in the left corona radiata and left basal ganglia. Possible remote infarct in the right parietal lobe. No dural venous sinus thrombosis on this non-dedicated study. IMPRESSION: 1. Slightly limited evaluation as above, due to contrast bolus timing and streak artifact. Within these limitations, no  large vessel occlusion or hemodynamically significant stenosis in the head or neck. 2. Atherosclerosis of the carotid siphons contributing to mild stenosis of the bilateral cavernous ICAs. 3. Generalized parenchymal volume loss and chronic microvascular ischemic changes. 4. Remote infarcts in the left corona radiata and left basal ganglia, with a possible remote infarct in the right parietal lobe. Electronically signed by: Donnice Mania MD 09/20/2024 03:58 PM EDT RP  Workstation: [id number]   CUP PACEART REMOTE DEVICE CHECK Result Date: 09/07/2024 ILR summary report received. Battery status OK. Normal device function. No new tachy, brady, or pause episodes. No new AF episodes. Monthly summary reports and ROV/PRN 1 symptom activation c/w SR with PAC's and PVC's, and increased rate prior to activation LA, CVRS    Time coordinating discharge: Over 30 minutes    Alm Apo, MD  [facility] 09/21/2024, 1:53 PM

## 2024-09-21 NOTE — TOC Transition Note (Signed)
 Transition of Care [facility]) - Discharge Note   Patient Details  Name: Jason Salazar MRN: [id number] Date of Birth: 26-Feb-1946  Transition of [facility]) CM/SW Contact:  Andrez JULIANNA George, RN Phone Number: 09/21/2024, 11:49 AM   Clinical Narrative:     [name] is a 78 year old male with PMH CVA, A-flutter/PAF/Mobitz 1, hx syncope (ILR in place), CKD 3 AA, DM II, HTN, HLD, chronic diastolic CHF, OSA on CPAP, seizure disorder, prostate cancer (implanted XRT seeds @ [facility] now follows with [facility]), obesity, gout.   Pt is from home with his spouse and son. Someone is with him most of the time.  Pt has CPAP at home.  Spouse provides needed transportation.  Pt manages his own meds but wife can assist as needed.  Wife transporting pt home.   Final next level of care: Home/Self Care Barriers to Discharge: No Barriers Identified   Patient Goals and CMS Choice            Discharge Placement                       Discharge Plan and Services Additional resources added to the After Visit Summary for                                       Social Drivers of Health (SDOH) Interventions SDOH Screenings   Food Insecurity: No Food Insecurity (09/21/2024)  Housing: Low Risk  (09/21/2024)  Transportation Needs: No Transportation Needs (09/21/2024)  Utilities: Not At Risk (09/21/2024)  Alcohol Screen: Low Risk  (05/18/2024)  Depression (PHQ2-9): Low Risk  (07/20/2024)  Financial Resource Strain: Low Risk  (06/04/2024)  Physical Activity: Insufficiently Active (06/04/2024)  Social Connections: Socially Integrated (09/21/2024)  Stress: No Stress Concern Present (06/04/2024)  Recent Concern: Stress - Stress Concern Present (03/16/2024)  Tobacco Use: Low Risk  (09/20/2024)  Health Literacy: Adequate Health Literacy (03/16/2024)     Readmission Risk Interventions     No data to display

## 2024-09-21 NOTE — TOC CAGE-AID Note (Signed)
 Transition of Care [facility]) - CAGE-AID Screening   Patient Details  Name: Jason Salazar MRN: [id number] Date of Birth: [birth year]  Transition of Care [facility]) CM/SW Contact:    Adysen Raphael E Janani Chamber, LCSW Phone Number: 09/21/2024, 11:10 AM   Clinical Narrative: No SA noted.   CAGE-AID Screening:    Have You Ever Felt You Ought to Cut Down on Your Drinking or Drug Use?: No Have People Annoyed You By Critizing Your Drinking Or Drug Use?: No Have You Felt Bad Or Guilty About Your Drinking Or Drug Use?: No Have You Ever Had a Drink or Used Drugs First Thing In The Morning to Steady Your Nerves or to Get Rid of a Hangover?: No CAGE-AID Score: 0  Substance Abuse Education Offered: No

## 2024-09-23 ENCOUNTER — Encounter: Payer: Self-pay | Admitting: "Endocrinology

## 2024-09-23 ENCOUNTER — Other Ambulatory Visit (HOSPITAL_BASED_OUTPATIENT_CLINIC_OR_DEPARTMENT_OTHER): Payer: Self-pay

## 2024-09-23 ENCOUNTER — Ambulatory Visit (INDEPENDENT_AMBULATORY_CARE_PROVIDER_SITE_OTHER): Admitting: "Endocrinology

## 2024-09-23 VITALS — BP 130/72 | HR 72 | Ht 63.0 in | Wt 178.4 lb

## 2024-09-23 DIAGNOSIS — E782 Mixed hyperlipidemia: Secondary | ICD-10-CM

## 2024-09-23 DIAGNOSIS — E66811 Obesity, class 1: Secondary | ICD-10-CM | POA: Diagnosis not present

## 2024-09-23 DIAGNOSIS — E6609 Other obesity due to excess calories: Secondary | ICD-10-CM | POA: Diagnosis not present

## 2024-09-23 DIAGNOSIS — E1122 Type 2 diabetes mellitus with diabetic chronic kidney disease: Secondary | ICD-10-CM | POA: Diagnosis not present

## 2024-09-23 DIAGNOSIS — N1831 Chronic kidney disease, stage 3a: Secondary | ICD-10-CM

## 2024-09-23 DIAGNOSIS — I10 Essential (primary) hypertension: Secondary | ICD-10-CM | POA: Diagnosis not present

## 2024-09-23 DIAGNOSIS — Z794 Long term (current) use of insulin: Secondary | ICD-10-CM

## 2024-09-23 DIAGNOSIS — Z6831 Body mass index (BMI) 31.0-31.9, adult: Secondary | ICD-10-CM | POA: Diagnosis not present

## 2024-09-23 MED ORDER — INSULIN DEGLUDEC 100 UNIT/ML ~~LOC~~ SOPN
26.0000 [IU] | PEN_INJECTOR | Freq: Every day | SUBCUTANEOUS | 1 refills | Status: AC
  Filled 2024-09-23: qty 15, 57d supply, fill #0
  Filled 2024-11-23: qty 15, 57d supply, fill #1

## 2024-09-23 NOTE — Patient Instructions (Signed)

## 2024-09-23 NOTE — Progress Notes (Signed)
 09/23/2024, 12:05 PM  Endocrinology follow-up note   Subjective:    Patient ID: Jason Salazar, male    DOB: [birth year].  Jason Salazar is being seen in follow-up after he was seen in consultation  for management of currently uncontrolled symptomatic diabetes requested by  Vicci Barnie NOVAK, MD.   Past Medical History:  Diagnosis Date   Allergic rhinitis    Balantidiasis    Cancer [facility])    prostate cancer treated 2007 with radiation seed placement   Diabetes (HCC)    H/O prostate cancer    Radioactive seeds   Hypercholesteremia    Hyperlipidemia    Hyperlipoproteinemia    Hypertension    Sleep apnea     Past Surgical History:  Procedure Laterality Date   cyst removed from right kidney      Social History   Socioeconomic History   Marital status: Married    Spouse name: Almarie   Number of children: 1   Years of education: HS   Highest education level: 12th grade  Occupational History    Employer: [employer]  Tobacco Use   Smoking status: Never   Smokeless tobacco: Never   Tobacco comments:    Never smoked 01/21/24  Vaping Use   Vaping status: Never Used  Substance and Sexual Activity   Alcohol use: No   Drug use: No   Sexual activity: Not on file  Other Topics Concern   Not on file  Social History Narrative   Patient is married to Hedrick and has one son.   Caffeine Use: none; quit a few months ago             Social Drivers of Corporate investment banker Strain: Low Risk  (06/04/2024)   Overall Financial Resource Strain (CARDIA)    Difficulty of Paying Living Expenses: Not hard at all  Food Insecurity: No Food Insecurity (09/21/2024)   Hunger Vital Sign    Worried About Running Out of Food in the Last Year: Never true    Ran Out of Food in the Last Year: Never true  Transportation Needs: No Transportation Needs (09/21/2024)   PRAPARE - Therapist, art (Medical): No    Lack of Transportation (Non-Medical): No  Physical Activity: Insufficiently Active (06/04/2024)   Exercise Vital Sign    Days of Exercise per Week: 1 day    Minutes of Exercise per Session: 10 min  Stress: No Stress Concern Present (06/04/2024)   [employer] of Occupational Health - Occupational Stress Questionnaire    Feeling of Stress: Not at all  Recent Concern: Stress - Stress Concern Present (03/16/2024)   [employer] of Occupational Health - Occupational Stress Questionnaire    Feeling of Stress : To some extent  Social Connections: Socially Integrated (09/21/2024)   Social Connection and Isolation Panel    Frequency of Communication with Friends and Family: More than three times a week    Frequency of Social Gatherings with Friends and Family: Once a week    Attends Religious Services: More than 4 times per year    Active Member of [employer] or Organizations: Yes  Attends Banker Meetings: Never    Marital Status: Married    Family History  Problem Relation Age of Onset   CVA Mother    Sleep apnea Brother    Thyroid  cancer Father    Prostate cancer Father     Outpatient Encounter Medications as of 09/23/2024  Medication Sig   lisinopril -hydrochlorothiazide  (ZESTORETIC ) 20-12.5 MG tablet Take 1 tablet by mouth daily.   rosuvastatin  (CRESTOR ) 20 MG tablet Take 20 mg by mouth at bedtime.   allopurinol  (ZYLOPRIM ) 100 MG tablet Take 100 mg by mouth 2 (two) times daily as needed (for gout flares).   Apoaequorin (PREVAGEN PO) Take 1 tablet by mouth See admin instructions. Prevagen chewable tablets - Chew 1 tablet by mouth once a day   aspirin EC 81 MG tablet Take 81 mg by mouth in the morning.   b complex vitamins tablet Take 1 tablet by mouth daily.   bimatoprost  (LUMIGAN ) 0.01 % SOLN Instill 1 drop into both eyes at bedtime   Blood Pressure Monitoring (BLOOD PRESSURE CUFF) MISC Use to check blood pressure daily.    Cholecalciferol (VITAMIN D3) 1000 units CAPS Take 1,000 Units by mouth daily.   Colchicine  0.6 MG CAPS Take 1 capsule by mouth 2 (two) times daily as needed for gout flare up   Continuous Glucose Sensor (FREESTYLE LIBRE 3 PLUS SENSOR) MISC Change sensor every 15 days.   dapagliflozin  propanediol (FARXIGA ) 10 MG TABS tablet TAKE 1 TABLET BY MOUTH EVERY DAY   DILANTIN  100 MG ER capsule Take 3 capsules (300 mg total) by mouth at bedtime.   furosemide  (LASIX ) 20 MG tablet Take 1 tablet (20 mg total) by mouth daily.   ibuprofen (ADVIL) 800 MG tablet Take 800 mg by mouth every 8 (eight) hours as needed (for pain).   insulin  degludec (TRESIBA ) 100 UNIT/ML FlexTouch Pen Inject 26 Units into the skin at bedtime.   lacosamide  (VIMPAT ) 50 MG TABS tablet Take 1 tablet (50 mg total) by mouth 2 (two) times daily. (Patient taking differently: Take 50 mg by mouth See admin instructions. Take 50 mg by mouth in the morning and evening)   mometasone (NASONEX) 50 MCG/ACT nasal spray Place 2 sprays into the nose daily as needed (for seasonal allergies).   Multiple Vitamins-Minerals (ONE-A-DAY MENS 50+ ADVANTAGE PO) Take 1 tablet by mouth daily with breakfast.   tiZANidine (ZANAFLEX) 4 MG tablet Take 4 mg by mouth 2 (two) times daily as needed (for pain).   Vitamin C 250 MG TABS Take 250 mg by mouth daily.   warfarin (COUMADIN ) 5 MG tablet Take 1 tablet (5 mg total) by mouth daily. (Patient taking differently: Take 2.5-5 mg by mouth See admin instructions. Take 2.5 mg by mouth in the morning on Sun/Tues/Thurs/Sat and 5 mg on Mon/Wed/Fri)   [DISCONTINUED] insulin  degludec (TRESIBA ) 100 UNIT/ML FlexTouch Pen Inject 22 Units into the skin at bedtime.   [DISCONTINUED] losartan  (COZAAR ) 25 MG tablet Take 1 tablet (25 mg total) by mouth daily. (Patient not taking: Reported on 09/23/2024)   No facility-administered encounter medications on file as of 09/23/2024.    ALLERGIES: Allergies  Allergen Reactions    Atorvastatin Other (See Comments)    Light-headedness    VACCINATION STATUS: Immunization History  Administered Date(s) Administered   Fluad Quad(high Dose 65+) 09/18/2021   INFLUENZA, HIGH DOSE SEASONAL PF 09/02/2019, 09/06/2024   Influenza Split 09/08/2012, 09/08/2013   Influenza-Unspecified 09/08/2012, 08/09/2022   PFIZER(Purple Top)SARS-COV-2 Vaccination 01/22/2020, 02/13/2020, 03/05/2024   PNEUMOCOCCAL CONJUGATE-20  03/16/2024   Pfizer Covid-19 Vaccine Bivalent Booster 39yrs & up 10/26/2021   Pfizer(Comirnaty )Fall Seasonal Vaccine 12 years and older 02/06/2023   Tdap 03/16/2024    Diabetes He presents for his follow-up diabetic visit. He has type 2 diabetes mellitus. His disease course has been improving. There are no hypoglycemic associated symptoms. Pertinent negatives for hypoglycemia include no confusion, headaches, pallor or seizures. Pertinent negatives for diabetes include no chest pain, no fatigue, no polydipsia, no polyphagia, no polyuria and no weakness. There are no hypoglycemic complications. Symptoms are improving. 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 (Obesity, hyperlipidemia, hypertension, sedentary life, recurrent TIAs) Risk factors for coronary artery disease include diabetes mellitus, dyslipidemia, family history, hypertension, male sex, obesity and sedentary lifestyle. Current diabetic treatment includes insulin  injections and oral agent (monotherapy). His weight is increasing steadily. He is following a generally unhealthy diet. When asked about meal planning, he reported none. He has not had a previous visit with a dietitian. He rarely participates in exercise. His home blood glucose trend is fluctuating minimally. His breakfast blood glucose range is generally 140-180 mg/dl. His lunch blood glucose range is generally 140-180 mg/dl. His dinner blood glucose range is generally 140-180 mg/dl. His bedtime blood glucose range is generally 140-180 mg/dl. His  overall blood glucose range is 140-180 mg/dl. (He presents with near target glycemic profile averaging 159 mg per DL for the most recent 2 weeks with glucose.  Obesity of 28.1%.  His AGP shows 73% time in range, 23% level 1 hyperglycemia, 4% level 2 hyperglycemia.  He has no hypoglycemia.  His previsit labs show A1c of 7.5%, improvement from 7.8% during his last visit.  ) An ACE inhibitor/angiotensin II receptor blocker is being taken.  Hyperlipidemia This is a chronic problem. The current episode started more than 1 year ago. The problem is controlled. Exacerbating diseases include chronic renal disease, diabetes and obesity. Pertinent negatives include no chest pain, myalgias or shortness of breath. Current antihyperlipidemic treatment includes statins. Risk factors for coronary artery disease include dyslipidemia, diabetes mellitus, hypertension, male sex, obesity, a sedentary lifestyle and family history.  Hypertension This is a chronic problem. The current episode started more than 1 year ago. The problem is controlled. Pertinent negatives include no chest pain, headaches, neck pain, palpitations or shortness of breath. Risk factors for coronary artery disease include dyslipidemia, obesity, male gender, diabetes mellitus and sedentary lifestyle. Past treatments include angiotensin blockers. Hypertensive end-organ damage includes kidney disease and CVA. Identifiable causes of hypertension include chronic renal disease.    Objective:       09/23/2024    8:48 AM 09/21/2024   11:10 AM 09/21/2024    7:59 AM  Vitals with BMI  Height 5' 3    Weight 178 lbs 6 oz    BMI 31.61    Systolic 130 170 873  Diastolic 72 82 60  Pulse 72 67 87    BP 130/72   Pulse 72   Ht 5' 3 (1.6 m)   Wt 178 lb 6.4 oz (80.9 kg)   BMI 31.60 kg/m   Wt Readings from Last 3 Encounters:  09/23/24 178 lb 6.4 oz (80.9 kg)  09/13/24 181 lb (82.1 kg)  08/04/24 173 lb (78.5 kg)       CMP ( most recent) CMP      Component Value Date/Time   NA 137 09/21/2024 0128   NA 139 08/31/2024 1213   K 4.2 09/21/2024 0128   CL 103 09/21/2024 0128   CO2  23 09/21/2024 0128   GLUCOSE 119 (H) 09/21/2024 0128   BUN 25 (H) 09/21/2024 0128   BUN 28 (H) 08/31/2024 1213   CREATININE 1.36 (H) 09/21/2024 0128   CREATININE 1.32 (H) 11/12/2022 1031   CALCIUM  8.5 (L) 09/21/2024 0128   PROT 6.9 09/20/2024 1353   PROT 7.0 08/31/2024 1213   ALBUMIN 3.8 09/20/2024 1353   ALBUMIN 4.2 08/31/2024 1213   AST 21 09/20/2024 1353   ALT 30 09/20/2024 1353   ALKPHOS 112 09/20/2024 1353   BILITOT 0.6 09/20/2024 1353   BILITOT 0.2 08/31/2024 1213   GFRNONAA 54 (L) 09/21/2024 0128   GFRNONAA 46 (L) 03/19/2021 0913   GFRAA 53 (L) 03/19/2021 0913     Lipid Panel ( most recent) Lipid Panel     Component Value Date/Time   CHOL 266 (H) 09/21/2024 0128   CHOL 208 (H) 08/31/2024 1213   TRIG 207 (H) 09/21/2024 0128   HDL 53 09/21/2024 0128   HDL 61 08/31/2024 1213   CHOLHDL 5.0 09/21/2024 0128   VLDL 41 (H) 09/21/2024 0128   LDLCALC 172 (H) 09/21/2024 0128   LDLCALC 116 (H) 08/31/2024 1213   LDLCALC 83 03/19/2021 0913   LABVLDL 31 08/31/2024 1213      Lab Results  Component Value Date   TSH 1.370 05/19/2024   TSH 2.490 09/03/2023   TSH 2.060 03/06/2023   TSH 1.530 10/09/2021   TSH 1.96 03/19/2021   FREET4 0.87 05/19/2024   FREET4 0.82 09/03/2023    Lipid Panel     Component Value Date/Time   CHOL 266 (H) 09/21/2024 0128   CHOL 208 (H) 08/31/2024 1213   TRIG 207 (H) 09/21/2024 0128   HDL 53 09/21/2024 0128   HDL 61 08/31/2024 1213   CHOLHDL 5.0 09/21/2024 0128   VLDL 41 (H) 09/21/2024 0128   LDLCALC 172 (H) 09/21/2024 0128   LDLCALC 116 (H) 08/31/2024 1213   LDLCALC 83 03/19/2021 0913   LABVLDL 31 08/31/2024 1213     Assessment & Plan:   1. Type 2 diabetes mellitus with stage 3a chronic kidney disease, with long-term current use of insulin  (HCC)   - Jason Salazar has currently uncontrolled  symptomatic type 2 DM since  78 years of age.  He presents with near target glycemic profile averaging 159 mg per DL for the most recent 2 weeks with glucose.  Obesity of 28.1%.  His AGP shows 73% time in range, 23% level 1 hyperglycemia, 4% level 2 hyperglycemia.  He has no hypoglycemia.  His previsit labs show A1c of 7.5%, improvement from 7.8% during his last visit.    Recent labs reviewed. - I had a long discussion with him about the possible risk factors and  the pathology behind its diabetes and its complications. -his diabetes is complicated by CVA, CKD stage 3a, obesity/sedentary life, comorbid hypertension, hyperlipidemia, and he remains at exceedingly high risk for more acute and chronic complications which include CAD, CVA, CKD, retinopathy, and neuropathy. These are all discussed in detail with him.  - I discussed all available options of managing his diabetes including de-escalation of medications. I have counseled him on Food as Medicine by adopting a Whole Food , Plant Predominant  ( WFPP) nutrition as recommended by [employer] of Lifestyle Medicine. Patient is encouraged to switch to  unprocessed or minimally processed  complex starch, adequate protein intake (mainly plant source), minimal liquid fat, plenty of fruits, and vegetables. -  he is advised to stick to a routine  mealtimes to eat 3 complete meals a day and snack only when necessary ( to snack only to correct hypoglycemia BG <70 day time or <100 at night).   - he acknowledges that there is a room for improvement in his food and drink choices. - Suggestion is made for him to avoid simple carbohydrates  from his diet including Cakes, Sweet Desserts, Ice Cream, Soda (diet and regular), Sweet Tea, Candies, Chips, Cookies, Store Bought Juices, Alcohol , Artificial Sweeteners,  Coffee Creamer, and Sugar-free Products, Lemonade. This will help patient to have more stable blood glucose profile and potentially avoid unintended  weight gain.  The following Lifestyle Medicine recommendations according to American College of Lifestyle Medicine  [facility]) were discussed and and offered to patient and he  agrees to start the journey:  A. Whole Foods, Plant-Based Nutrition comprising of fruits and vegetables, plant-based proteins, whole-grain carbohydrates was discussed in detail with the patient.   A list for source of those nutrients were also provided to the patient.  Patient will use only water or unsweetened tea for hydration. B.  The need to stay away from risky substances including alcohol, smoking; obtaining 7 to 9 hours of restorative sleep, at least 150 minutes of moderate intensity exercise weekly, the importance of healthy social connections,  and stress management techniques were discussed. C.  A full color page of  Calorie density of various food groups per pound showing examples of each food groups was provided to the patient.   - he will be scheduled with Penny Crumpton, RDN, CDE for individualized diabetes education.  - I have approached him with the following individualized plan to manage  his diabetes and patient agrees:  - He did not tolerate GLP-1 receptor agonists options which were discontinued during his last visit.   - He will need a higher dose of basal insulin  to maintain control of diabetes to target, discussed and increase his Tresiba  to 26 units nightly, continue to utilize his CGM.   - he is encouraged to call clinic for blood glucose levels less than 70 or above 200 mg /dl weekly average blood glucose. - he is benefiting from his SGLT2 inhibitors.  He is advised to continue Farxiga  10 mg p.o. daily at breakfast.   - His previsit labs show slight worsening of CKD, will be kept off of metformin  for now.     -Targets for  A1c;  LDL, HDL,  and Triglycerides were discussed with the patient.  2) Blood Pressure /Hypertension:    -His blood pressure is controlled.  he is advised to continue his current  medications including clonidine 0.1 mg p.o. daily, losartan  25 mg p.o. daily with breakfast .  3) Lipids/Hyperlipidemia: Review of his previsit labs show worsening of dyslipidemia with LDL at 172 progressively increasing from 103.  For unclear reasons, he was advised not to take Crestor  by his other providers.  I do not see a good reason for this recommendation, advised him to restart Crestor  20 mg p.o. nightly even while waiting for Repatha prescription.  The patient is high risk for recurrent CVAs/TIAs and cardiovascular events.  His LDL target is under 55 mg per DL-likely will need multiple modality antilipid treatment.   4) obesity wit class 1-  Body mass index is 31.6 kg/m.  -He is currently losing weight, high BMI clearly complicating his diabetes care.   he is  a candidate for weight loss. I discussed with him the fact that loss of 5 - 10%  of his  current body weight will have the most impact on his diabetes management.  The above detailed  ACLM recommendations for nutrition, exercise, sleep, social life, avoidance of risky substances, the need for restorative sleep   information will also detailed on discharge instructions.  5) Chronic Care/Health Maintenance:  -he  is on ACEI/ARB and Statin medications and  is encouraged to initiate and continue to follow up with Ophthalmology, Dentist,  Podiatrist at least yearly or according to recommendations, and advised to   stay away from smoking. I have recommended yearly flu vaccine and pneumonia vaccine at least every 5 years; moderate intensity exercise for up to 150 minutes weekly; and  sleep for 7- 9 hours a day.  - he is  advised to maintain close follow up with Vicci Barnie NOVAK, MD for primary care needs, as well as his other providers for optimal and coordinated care.  I spent  41  minutes in the care of the patient today including review of labs from CMP, Lipids, Thyroid  Function, Hematology (current and previous including abstractions from  other facilities); face-to-face time discussing  his blood glucose readings/logs, discussing hypoglycemia and hyperglycemia episodes and symptoms, medications doses, his options of short and long term treatment based on the latest standards of care / guidelines;  discussion about incorporating lifestyle medicine;  and documenting the encounter. Risk reduction counseling performed per USPSTF guidelines to reduce  obesity and cardiovascular risk factors.     Please refer to Patient Instructions for Blood Glucose Monitoring and Insulin /Medications Dosing Guide  in media tab for additional information. Please  also refer to  Patient Self Inventory in the Media  tab for reviewed elements of pertinent patient history.  Jason Salazar participated in the discussions, expressed understanding, and voiced agreement with the above plans.  All questions were answered to his satisfaction. he is encouraged to contact clinic should he have any questions or concerns prior to his return visit.     Follow up plan: - Return in about 4 months (around 01/24/2025) for F/U with Pre-visit Labs, Meter/CGM/Logs, A1c here.  Ranny Earl, MD [facility] [address] [place], [place] [address] Phone: [phone]  Fax: [phone]    09/23/2024, 12:05 PM  This note was partially dictated with voice recognition software. Similar sounding words can be transcribed inadequately or may not  be corrected upon review.

## 2024-09-25 ENCOUNTER — Other Ambulatory Visit (HOSPITAL_BASED_OUTPATIENT_CLINIC_OR_DEPARTMENT_OTHER): Payer: Self-pay

## 2024-09-25 MED ORDER — COMIRNATY 30 MCG/0.3ML IM SUSY
0.3000 mL | PREFILLED_SYRINGE | Freq: Once | INTRAMUSCULAR | 0 refills | Status: AC
  Filled 2024-09-25: qty 0.3, 1d supply, fill #0

## 2024-09-29 ENCOUNTER — Other Ambulatory Visit: Payer: Self-pay

## 2024-09-29 ENCOUNTER — Encounter (HOSPITAL_COMMUNITY): Payer: Self-pay | Admitting: Emergency Medicine

## 2024-09-29 ENCOUNTER — Emergency Department (HOSPITAL_COMMUNITY)

## 2024-09-29 ENCOUNTER — Other Ambulatory Visit (HOSPITAL_BASED_OUTPATIENT_CLINIC_OR_DEPARTMENT_OTHER): Payer: Self-pay

## 2024-09-29 ENCOUNTER — Inpatient Hospital Stay (HOSPITAL_COMMUNITY): Admission: EM | Admit: 2024-09-29 | Discharge: 2024-10-01 | DRG: 069 | Disposition: A | Discharge status: Home Health

## 2024-09-29 DIAGNOSIS — G40909 Epilepsy, unspecified, not intractable, without status epilepticus: Secondary | ICD-10-CM | POA: Diagnosis not present

## 2024-09-29 DIAGNOSIS — M109 Gout, unspecified: Secondary | ICD-10-CM | POA: Diagnosis not present

## 2024-09-29 DIAGNOSIS — Z8546 Personal history of malignant neoplasm of prostate: Secondary | ICD-10-CM | POA: Diagnosis not present

## 2024-09-29 DIAGNOSIS — I639 Cerebral infarction, unspecified: Secondary | ICD-10-CM | POA: Diagnosis not present

## 2024-09-29 DIAGNOSIS — Z6832 Body mass index (BMI) 32.0-32.9, adult: Secondary | ICD-10-CM

## 2024-09-29 DIAGNOSIS — I10 Essential (primary) hypertension: Secondary | ICD-10-CM | POA: Diagnosis not present

## 2024-09-29 DIAGNOSIS — E1122 Type 2 diabetes mellitus with diabetic chronic kidney disease: Secondary | ICD-10-CM | POA: Diagnosis present

## 2024-09-29 DIAGNOSIS — Z79899 Other long term (current) drug therapy: Secondary | ICD-10-CM | POA: Diagnosis not present

## 2024-09-29 DIAGNOSIS — E669 Obesity, unspecified: Secondary | ICD-10-CM | POA: Diagnosis present

## 2024-09-29 DIAGNOSIS — E1151 Type 2 diabetes mellitus with diabetic peripheral angiopathy without gangrene: Secondary | ICD-10-CM | POA: Diagnosis not present

## 2024-09-29 DIAGNOSIS — R29818 Other symptoms and signs involving the nervous system: Secondary | ICD-10-CM | POA: Diagnosis not present

## 2024-09-29 DIAGNOSIS — I509 Heart failure, unspecified: Secondary | ICD-10-CM | POA: Diagnosis present

## 2024-09-29 DIAGNOSIS — G4733 Obstructive sleep apnea (adult) (pediatric): Secondary | ICD-10-CM | POA: Diagnosis not present

## 2024-09-29 DIAGNOSIS — G459 Transient cerebral ischemic attack, unspecified: Secondary | ICD-10-CM | POA: Diagnosis not present

## 2024-09-29 DIAGNOSIS — I130 Hypertensive heart and chronic kidney disease with heart failure and stage 1 through stage 4 chronic kidney disease, or unspecified chronic kidney disease: Secondary | ICD-10-CM | POA: Diagnosis not present

## 2024-09-29 DIAGNOSIS — Z794 Long term (current) use of insulin: Secondary | ICD-10-CM

## 2024-09-29 DIAGNOSIS — Z7982 Long term (current) use of aspirin: Secondary | ICD-10-CM

## 2024-09-29 DIAGNOSIS — R29702 NIHSS score 2: Secondary | ICD-10-CM

## 2024-09-29 DIAGNOSIS — Z7901 Long term (current) use of anticoagulants: Secondary | ICD-10-CM | POA: Diagnosis not present

## 2024-09-29 DIAGNOSIS — E119 Type 2 diabetes mellitus without complications: Secondary | ICD-10-CM

## 2024-09-29 DIAGNOSIS — Z8673 Personal history of transient ischemic attack (TIA), and cerebral infarction without residual deficits: Secondary | ICD-10-CM

## 2024-09-29 DIAGNOSIS — I480 Paroxysmal atrial fibrillation: Secondary | ICD-10-CM | POA: Diagnosis not present

## 2024-09-29 DIAGNOSIS — I441 Atrioventricular block, second degree: Secondary | ICD-10-CM | POA: Diagnosis present

## 2024-09-29 DIAGNOSIS — Z888 Allergy status to other drugs, medicaments and biological substances status: Secondary | ICD-10-CM

## 2024-09-29 DIAGNOSIS — E7800 Pure hypercholesterolemia, unspecified: Secondary | ICD-10-CM | POA: Diagnosis not present

## 2024-09-29 DIAGNOSIS — N183 Chronic kidney disease, stage 3 unspecified: Secondary | ICD-10-CM | POA: Diagnosis present

## 2024-09-29 DIAGNOSIS — I4891 Unspecified atrial fibrillation: Secondary | ICD-10-CM

## 2024-09-29 DIAGNOSIS — G8929 Other chronic pain: Secondary | ICD-10-CM | POA: Diagnosis present

## 2024-09-29 DIAGNOSIS — Z823 Family history of stroke: Secondary | ICD-10-CM | POA: Diagnosis not present

## 2024-09-29 DIAGNOSIS — Z923 Personal history of irradiation: Secondary | ICD-10-CM

## 2024-09-29 DIAGNOSIS — Z8042 Family history of malignant neoplasm of prostate: Secondary | ICD-10-CM

## 2024-09-29 DIAGNOSIS — R9089 Other abnormal findings on diagnostic imaging of central nervous system: Secondary | ICD-10-CM | POA: Diagnosis not present

## 2024-09-29 DIAGNOSIS — E785 Hyperlipidemia, unspecified: Secondary | ICD-10-CM | POA: Diagnosis not present

## 2024-09-29 DIAGNOSIS — Z808 Family history of malignant neoplasm of other organs or systems: Secondary | ICD-10-CM | POA: Diagnosis not present

## 2024-09-29 DIAGNOSIS — G458 Other transient cerebral ischemic attacks and related syndromes: Principal | ICD-10-CM | POA: Diagnosis present

## 2024-09-29 DIAGNOSIS — Z789 Other specified health status: Secondary | ICD-10-CM

## 2024-09-29 DIAGNOSIS — R252 Cramp and spasm: Secondary | ICD-10-CM | POA: Diagnosis present

## 2024-09-29 LAB — CBC
HCT: 46.7 % (ref 39.0–52.0)
HCT: 45.8 % (ref 39.0–52.0)
Hemoglobin: 14.5 g/dL (ref 13.0–17.0)
Hemoglobin: 14.4 g/dL (ref 13.0–17.0)
MCH: 27.9 pg (ref 26.0–34.0)
MCH: 28.3 pg (ref 26.0–34.0)
MCHC: 31 g/dL (ref 30.0–36.0)
MCHC: 31.4 g/dL (ref 30.0–36.0)
MCV: 90 fL (ref 80.0–100.0)
MCV: 90 fL (ref 80.0–100.0)
Platelets: 161 K/uL (ref 150–400)
Platelets: 150 K/uL (ref 150–400)
RBC: 5.19 MIL/uL (ref 4.22–5.81)
RBC: 5.09 MIL/uL (ref 4.22–5.81)
RDW: 14.3 % (ref 11.5–15.5)
RDW: 14.2 % (ref 11.5–15.5)
WBC: 4.6 K/uL (ref 4.0–10.5)
WBC: 4.6 K/uL (ref 4.0–10.5)
nRBC: 0 % (ref 0.0–0.2)
nRBC: 0 % (ref 0.0–0.2)

## 2024-09-29 LAB — PROTIME-INR
INR: 2 — ABNORMAL HIGH (ref 0.8–1.2)
Prothrombin Time: 23.9 s — ABNORMAL HIGH (ref 11.4–15.2)

## 2024-09-29 LAB — I-STAT CHEM 8, ED
BUN: 34 mg/dL — ABNORMAL HIGH (ref 8–23)
Calcium, Ion: 1.02 mmol/L — ABNORMAL LOW (ref 1.15–1.40)
Chloride: 105 mmol/L (ref 98–111)
Creatinine, Ser: 1.7 mg/dL — ABNORMAL HIGH (ref 0.61–1.24)
Glucose, Bld: 175 mg/dL — ABNORMAL HIGH (ref 70–99)
HCT: 47 % (ref 39.0–52.0)
Hemoglobin: 16 g/dL (ref 13.0–17.0)
Potassium: 5.3 mmol/L — ABNORMAL HIGH (ref 3.5–5.1)
Sodium: 139 mmol/L (ref 135–145)
TCO2: 26 mmol/L (ref 22–32)

## 2024-09-29 LAB — DIFFERENTIAL
Abs Immature Granulocytes: 0.02 K/uL (ref 0.00–0.07)
Basophils Absolute: 0 K/uL (ref 0.0–0.1)
Basophils Relative: 0 %
Eosinophils Absolute: 0 K/uL (ref 0.0–0.5)
Eosinophils Relative: 0 %
Immature Granulocytes: 0 %
Lymphocytes Relative: 30 %
Lymphs Abs: 1.4 K/uL (ref 0.7–4.0)
Monocytes Absolute: 0.6 K/uL (ref 0.1–1.0)
Monocytes Relative: 14 %
Neutro Abs: 2.5 K/uL (ref 1.7–7.7)
Neutrophils Relative %: 56 %

## 2024-09-29 LAB — APTT: aPTT: 49 s — ABNORMAL HIGH (ref 24–36)

## 2024-09-29 LAB — ETHANOL: Alcohol, Ethyl (B): <15 mg/dL (ref <15)

## 2024-09-29 LAB — CBG MONITORING, ED: Glucose-Capillary: 173 mg/dL — ABNORMAL HIGH (ref 70–99)

## 2024-09-29 MED ORDER — WARFARIN - PHARMACIST DOSING INPATIENT: Freq: Every day | Status: DC

## 2024-09-29 MED ORDER — LATANOPROST 0.005 % OP SOLN
1.0000 [drp] | Freq: Every day | OPHTHALMIC | Status: DC
  Administered 2024-09-30: 1 [drp] via OPHTHALMIC
  Filled 2024-09-29: qty 2.5

## 2024-09-29 MED ORDER — IOHEXOL 350 MG/ML SOLN
75.0000 mL | Freq: Once | INTRAVENOUS | Status: AC | PRN
  Administered 2024-09-29: 75 mL via INTRAVENOUS

## 2024-09-29 MED ORDER — SODIUM CHLORIDE 0.9 % IV BOLUS
500.0000 mL | Freq: Once | INTRAVENOUS | Status: AC
  Administered 2024-09-29: 500 mL via INTRAVENOUS

## 2024-09-29 MED ORDER — WARFARIN SODIUM 2.5 MG PO TABS
2.5000 mg | ORAL_TABLET | ORAL | Status: DC
  Administered 2024-09-30: 2.5 mg via ORAL
  Filled 2024-09-29: qty 1

## 2024-09-29 MED ORDER — WARFARIN SODIUM 5 MG PO TABS
5.0000 mg | ORAL_TABLET | ORAL | Status: DC
  Filled 2024-09-29: qty 1

## 2024-09-29 MED ORDER — ACETAMINOPHEN 325 MG PO TABS: 650.0000 mg | ORAL_TABLET | Freq: Four times a day (QID) | ORAL | Status: DC | PRN

## 2024-09-29 MED ORDER — ROSUVASTATIN CALCIUM 20 MG PO TABS
20.0000 mg | ORAL_TABLET | Freq: Every day | ORAL | Status: DC
  Administered 2024-09-29 – 2024-09-30 (×2): 20 mg via ORAL
  Filled 2024-09-29 (×2): qty 1

## 2024-09-29 MED ORDER — ACETAMINOPHEN 650 MG RE SUPP: 650.0000 mg | Freq: Four times a day (QID) | RECTAL | Status: DC | PRN

## 2024-09-29 MED ORDER — INSULIN ASPART 100 UNIT/ML IJ SOLN
0.0000 [IU] | Freq: Three times a day (TID) | INTRAMUSCULAR | Status: DC
  Administered 2024-09-30: 5 [IU] via SUBCUTANEOUS
  Administered 2024-10-01: 2 [IU] via SUBCUTANEOUS

## 2024-09-29 MED ORDER — FUROSEMIDE 20 MG PO TABS
20.0000 mg | ORAL_TABLET | Freq: Every day | ORAL | Status: DC
  Administered 2024-09-30 – 2024-10-01 (×2): 20 mg via ORAL
  Filled 2024-09-29 (×2): qty 1

## 2024-09-29 MED ORDER — REPATHA SURECLICK 140 MG/ML ~~LOC~~ SOAJ
140.0000 mg | SUBCUTANEOUS | 2 refills | Status: AC
  Filled 2024-09-29: qty 2, 28d supply, fill #0

## 2024-09-29 MED ORDER — LACOSAMIDE 50 MG PO TABS
50.0000 mg | ORAL_TABLET | Freq: Two times a day (BID) | ORAL | Status: DC
  Administered 2024-09-29 – 2024-10-01 (×4): 50 mg via ORAL
  Filled 2024-09-29 (×4): qty 1

## 2024-09-29 MED ORDER — PHENYTOIN SODIUM EXTENDED 100 MG PO CAPS
300.0000 mg | ORAL_CAPSULE | Freq: Every day | ORAL | Status: DC
  Administered 2024-09-29 – 2024-09-30 (×2): 300 mg via ORAL
  Filled 2024-09-29 (×3): qty 3

## 2024-09-29 MED ORDER — DAPAGLIFLOZIN PROPANEDIOL 10 MG PO TABS
10.0000 mg | ORAL_TABLET | Freq: Every day | ORAL | Status: DC
  Administered 2024-09-30 – 2024-10-01 (×2): 10 mg via ORAL
  Filled 2024-09-29 (×2): qty 1

## 2024-09-29 MED ORDER — TIZANIDINE HCL 4 MG PO TABS
4.0000 mg | ORAL_TABLET | Freq: Two times a day (BID) | ORAL | Status: DC | PRN
  Filled 2024-09-29: qty 1

## 2024-09-29 MED ORDER — ASPIRIN 325 MG PO TABS
325.0000 mg | ORAL_TABLET | Freq: Every day | ORAL | Status: DC
  Administered 2024-09-29 – 2024-10-01 (×3): 325 mg via ORAL
  Filled 2024-09-29 (×3): qty 1

## 2024-09-29 MED ORDER — SODIUM CHLORIDE 0.9% FLUSH: 3.0000 mL | Freq: Once | INTRAVENOUS | Status: DC

## 2024-09-29 MED ORDER — ALLOPURINOL 100 MG PO TABS: 100.0000 mg | ORAL_TABLET | Freq: Two times a day (BID) | ORAL | Status: DC | PRN

## 2024-09-29 NOTE — Assessment & Plan Note (Addendum)
 Recent lipid panel showed LDL 172, A1c 7.5. - Admit to Med-tele, Dr. Orie, FMTS - Cardiac telemetry 12 hours - Neurology consulted, appreciate recs  - Not a candidate for thrombolytics - s/p ASA 325mg  in ED - Cont home warfarin for afib (per pharmacy consult) - Holding home antihypertensive as below - Q4H neuro checks - TSH - PT/OT - AM Labs: CBC, BMP, Mag

## 2024-09-29 NOTE — ED Notes (Addendum)
 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  MRI made aware.

## 2024-09-29 NOTE — Assessment & Plan Note (Addendum)
 Gout - Continue home Allopurinol  100mg  BID PRN Seizure history - Continue home Vimpat  50mg  BID and Dilantin  ER 300mg  at bedtime. CHF - Continue home Lasix  20mg  daily HLD - Continue home Crestor  20mg  daily. Has been approved for Repatha but not yet started this. Muscle cramps - Continue home Tizanidine 4mg  PRN BID HTN - HOLDING home Lisinopril -hydrochlorothiazide  20-12.5mg  for permissive HTN

## 2024-09-29 NOTE — Hospital Course (Signed)
 This is a 78 year old male with PMHx notable for Afib, epilepsy, CHF, T2DM, HLD, HTN, hx of CVA, gout who was admitted to the [facility] family medicine teaching service with right face and upper extremity numbness suspected secondary to TIA.  Numbness, suspected TIA Patient brought in by EMS as code stroke with sudden onset right-sided face and upper extremity numbness, no motor deficits. Neurology consulted. CT/CTA head and neck without hemorrhage or large vessel occlusion.  MRI negative for acute stroke. Did not receive thrombolytics.  Received aspirin 325 mg then home warfarin and aspirin 81 mg continued. Neurology with no additional recommendations. Patient reported some symptomatic improvement and was medically stable for discharge.   PCP recommendations: Patient expressed distrust in the medical field with concerns that recommendation to discontinue statin and start on Repatha is motivated by financial incentives to prescriber. Consider further discussing recommendation and lab support for needing to transition to Repatha. Patient interested in outpatient PT. Consider referral.  Recommend continued workup for alternate etiology of recurrent numbness. Could consider EMG vs cervical spine MRI with reported intermittent, BUE symptoms.  Ensure follow-up with outpatient neurologist

## 2024-09-29 NOTE — H&P (Cosign Needed Addendum)
 Hospital Admission History and Physical Service Pager: [phone]  Patient name: Jason Salazar Medical record number: [id number] Date of Birth: 09-09-1946 Age: 78 y.o. Gender: male  Primary Care Provider: Dr. Barnie Louder, MD Consultants: Neurology Code Status:  Full (No blood products, patient is a Jehovah's Witness) Preferred Emergency Contact:   Contact Information     Name Relation Home Work [place] Spouse [phone]      Other Contacts     Name Relation Home Work [place] Son   [phone]   Fabion, Gatson [phone]  [phone]        Chief Complaint: CVA  Differential and Medical Decision Making:  Jason Salazar is a 78 y.o. male presenting with R face and side numbness who has a PMHx of CVA, A. Fib, CKD 3, DM II, HTN, HLD, CHF, OSA, seizure disorder, prostate cancer, obesity, and gout. This then progressed while he was here in the hospital to include R sided numbness and cold sensation to R arm.   Differential for this presentation includes:  1. CVA: Neurologic deficits have appeared and not resolved.   2. TIA: Imaging has not confirmed a stroke. Assessment & Plan CVA (cerebral vascular accident) (HCC) Recent lipid panel showed LDL 172, A1c 7.5. - Admit to Med-tele, Dr. Orie, FMTS - Cardiac telemetry 12 hours - Neurology consulted, appreciate recs  - Not a candidate for thrombolytics - s/p ASA 325mg  in ED - Cont home warfarin for afib (per pharmacy consult) - Holding home antihypertensive as below - Q4H neuro checks - TSH - PT/OT - AM Labs: CBC, BMP, Mag PAF (paroxysmal atrial fibrillation) (HCC) Rate controlled - EKG ordered - Coumadin  per pharmacy recs Diabetes (HCC) - Farxiga  10mg  daily - Sliding Scale Insulin , moderate scale - Holding home regimen of tresiba  until PO improves Chronic health problem Gout - Continue home Allopurinol  100mg  BID PRN Seizure history -  Continue home Vimpat  50mg  BID and Dilantin  ER 300mg  at bedtime. CHF - Continue home Lasix  20mg  daily HLD - Continue home Crestor  20mg  daily. Has been approved for Repatha but not yet started this. Muscle cramps - Continue home Tizanidine 4mg  PRN BID HTN - HOLDING home Lisinopril -hydrochlorothiazide  20-12.5mg  for permissive HTN  FEN/GI: Carb Modified diet VTE Prophylaxis: Coumadin  per pharmacy recs  Disposition: Med-tele  History of Present Illness:  Jason Salazar is a 78 y.o. male presenting with CVA who has a PMHx of CVA, A. Fib, CKD 3, DM II, HTN, HLD, CHF, OSA, seizure disorder, prostate cancer, obesity, gout. Presented to [facility] for R facial numbness  10/13 - Pt was recently admitted to [facility] after transient R sided facial numbness which spontaneously resolved after 5 minutes. There were no other neuro deficits at that time. On aspirin at coumadin  at home.   10/22 - Pt had R sided facial numbness, called EMS and was brought to [facility] as Code Stroke. Symptoms started at 1945. Pt on coumadin  (INR 2.0), and thus not a candidate for TNK. CT Head negative for acute changes. Symptoms worsened to include R sided arm and leg numbness as well. Fluids and aspirin given.  On interview, reports his R arm feels cold. Sensation decreased grossly on R side, especially in R LE. Denies fever, SOB, chest pain, abdominal pain.  Review Of Systems: Per HPI with the following additions:   Pertinent Past Medical History: CVA in past, TIA on 10/13. A. Fib, Diabetes, HTN, HLD, CHF, OSA, seizure history, gout. Remainder reviewed in history  tab.   Pertinent Past Surgical History: Remainder reviewed in history tab.   Pertinent Social History: No per chart review  Pertinent Family History:  Mother - CVA Father - Thyroid  cancer, prostate cancer Brother - sleep apnea   Important Outpatient Medications: See plan   Objective: BP (!) 132/51   Pulse (!) 106   Temp 98.4 F (36.9 C) (Oral)   Resp 15   Wt  82 kg   SpO2 100%   BMI 32.02 kg/m   Physical Exam Vitals and nursing note reviewed. Exam conducted with a chaperone present.  Constitutional:      General: He is not in acute distress.    Appearance: He is not toxic-appearing.  Cardiovascular:     Rate and Rhythm: Normal rate and regular rhythm.     Pulses: Normal pulses.     Heart sounds: No murmur heard. Pulmonary:     Effort: Pulmonary effort is normal. No respiratory distress.     Breath sounds: Normal breath sounds. No stridor. No wheezing or rhonchi.  Abdominal:     General: There is no distension.     Palpations: Abdomen is soft.     Tenderness: There is no abdominal tenderness.  Musculoskeletal:     Right lower leg: No edema.     Left lower leg: No edema.  Skin:    General: Skin is warm and dry.  Neurological:     Mental Status: He is alert.   Neuro: CN2: no vision changes CN3,4,6: PERRLA. EOMI CN5: Sensation intact BL CN7: Facial expressions symmetric CN8: Hearing intact BL CN9: palate symmetric  CN10: regular speech CN11: turns head against resistance CN12: tongue midline  Motor: Full strength R arm, forearm, thigh, leg, foot   Labs:  CBC BMET  Recent Labs  Lab 09/29/24 2035  WBC 4.6  HGB 14.5  HCT 46.7  PLT 161   Recent Labs  Lab 09/29/24 2034  NA 139  K 5.3*  CL 105  BUN 34*  CREATININE 1.70*  GLUCOSE 175*    INR 2.0 Lipid Panel (10/14): Cholesterol 266, Triglycerides 207, HDL 53, VLDL 41, LDL 172 EKG: Pending   Imaging Studies Performed:  MR BRAIN WO CONTRAST Result Date: 09/29/2024 EXAM: MRI BRAIN WITHOUT CONTRAST 09/29/2024 10:17:29 PM TECHNIQUE: Multiplanar multisequence MRI of the head/brain was performed without the administration of intravenous contrast. COMPARISON: 09/20/2024 CLINICAL HISTORY: Neuro deficit, acute, stroke suspected. FINDINGS: BRAIN AND VENTRICLES: No acute infarct. No intracranial hemorrhage. No mass. No midline shift. No hydrocephalus. Multifocal hyperintense  T2-weighted signal within the cerebral white matter, most commonly due to chronic small vessel disease. Mild volume loss. The sella is unremarkable. Normal flow voids. ORBITS: No acute abnormality. SINUSES AND MASTOIDS: No acute abnormality. BONES AND SOFT TISSUES: Normal marrow signal. No acute soft tissue abnormality. IMPRESSION: 1. No acute intracranial abnormality. 2. Multifocal T2 hyperintense white matter signal compatible with chronic small vessel disease. 3. Mild cerebral volume loss. Electronically signed by: Franky Stanford MD 09/29/2024 10:33 PM EDT RP Workstation: [id number]   CT HEAD CODE STROKE WO CONTRAST Result Date: 09/29/2024 EXAM: CTA Head and Neck with Intravenous Contrast. CT Head without Contrast. CLINICAL HISTORY: Neuro deficit, acute, stroke suspected. TECHNIQUE: Axial CTA images of the head and neck performed with intravenous contrast. MIP reconstructed images were created and reviewed. Axial computed tomography images of the head/brain performed without intravenous contrast. Note: Per PQRS, the description of internal carotid artery percent stenosis, including 0 percent or normal exam, is based on [place]  Symptomatic Carotid Endarterectomy Trial (NASCET) criteria. Dose reduction technique was used including one or more of the following: automated exposure control, adjustment of mA and kV according to patient size, and/or iterative reconstruction. CONTRAST: With and without; 75 mL (iohexol  (OMNIPAQUE ) 350 MG/ML injection 75 mL IOHEXOL  350 MG/ML SOLN) COMPARISON: 09/20/2024 FINDINGS: CT HEAD: BRAIN: No acute intraparenchymal hemorrhage. No mass lesion. No CT evidence for acute territorial infarct. No midline shift or extra-axial collection. Mild volume loss and chronic mild white matter changes. VENTRICLES: No hydrocephalus. ORBITS: The orbits are unremarkable. SINUSES AND MASTOIDS: The paranasal sinuses and mastoid air cells are clear. CTA NECK: COMMON CAROTID ARTERIES: No  significant stenosis. No dissection or occlusion. INTERNAL CAROTID ARTERIES: No stenosis by NASCET criteria. No dissection or occlusion. VERTEBRAL ARTERIES: No significant stenosis. No dissection or occlusion. CTA HEAD: ANTERIOR CEREBRAL ARTERIES: No significant stenosis. No occlusion. No aneurysm. MIDDLE CEREBRAL ARTERIES: No significant stenosis. No occlusion. No aneurysm. POSTERIOR CEREBRAL ARTERIES: No significant stenosis. No occlusion. No aneurysm. BASILAR ARTERY: No significant stenosis. No occlusion. No aneurysm. OTHER: VASCULATURE: Aspect is 10 aortic atherosclerosis. SOFT TISSUES: No acute finding. No masses or lymphadenopathy. BONES: No acute osseous abnormality. IMPRESSION: 1. No acute intracranial hemorrhage or acute ischemic change. ASPECTS is 10. 2. Mild cerebral volume loss and chronic mild white matter changes. 3. No emergent large vessel occlusion. Findings discussed with Dr. Lonni Tegeler at 8:55 PM on 09/29/2024. Electronically signed by: Franky Stanford MD 09/29/2024 08:56 PM EDT RP Workstation: [id number]   CT ANGIO HEAD NECK W WO CM (CODE STROKE) Result Date: 09/29/2024 EXAM: CTA Head and Neck with Intravenous Contrast. CT Head without Contrast. CLINICAL HISTORY: Neuro deficit, acute, stroke suspected. TECHNIQUE: Axial CTA images of the head and neck performed with intravenous contrast. MIP reconstructed images were created and reviewed. Axial computed tomography images of the head/brain performed without intravenous contrast. Note: Per PQRS, the description of internal carotid artery percent stenosis, including 0 percent or normal exam, is based on [place] Symptomatic Carotid Endarterectomy Trial (NASCET) criteria. Dose reduction technique was used including one or more of the following: automated exposure control, adjustment of mA and kV according to patient size, and/or iterative reconstruction. CONTRAST: With and without; 75 mL (iohexol  (OMNIPAQUE ) 350 MG/ML injection 75 mL  IOHEXOL  350 MG/ML SOLN) COMPARISON: 09/20/2024 FINDINGS: CT HEAD: BRAIN: No acute intraparenchymal hemorrhage. No mass lesion. No CT evidence for acute territorial infarct. No midline shift or extra-axial collection. Mild volume loss and chronic mild white matter changes. VENTRICLES: No hydrocephalus. ORBITS: The orbits are unremarkable. SINUSES AND MASTOIDS: The paranasal sinuses and mastoid air cells are clear. CTA NECK: COMMON CAROTID ARTERIES: No significant stenosis. No dissection or occlusion. INTERNAL CAROTID ARTERIES: No stenosis by NASCET criteria. No dissection or occlusion. VERTEBRAL ARTERIES: No significant stenosis. No dissection or occlusion. CTA HEAD: ANTERIOR CEREBRAL ARTERIES: No significant stenosis. No occlusion. No aneurysm. MIDDLE CEREBRAL ARTERIES: No significant stenosis. No occlusion. No aneurysm. POSTERIOR CEREBRAL ARTERIES: No significant stenosis. No occlusion. No aneurysm. BASILAR ARTERY: No significant stenosis. No occlusion. No aneurysm. OTHER: VASCULATURE: Aspect is 10 aortic atherosclerosis. SOFT TISSUES: No acute finding. No masses or lymphadenopathy. BONES: No acute osseous abnormality. IMPRESSION: 1. No acute intracranial hemorrhage or acute ischemic change. ASPECTS is 10. 2. Mild cerebral volume loss and chronic mild white matter changes. 3. No emergent large vessel occlusion. Findings discussed with Dr. Lonni Tegeler at 8:55 PM on 09/29/2024. Electronically signed by: Franky Stanford MD 09/29/2024 08:56 PM EDT RP Workstation: [id number]    [place],  Nancyann NOVAK, DO 09/29/2024, 11:32 PM PGY-1, [facility]  FPTS Intern pager: [phone], text pages welcome Secure chat group [facility] Teaching Service

## 2024-09-29 NOTE — Telephone Encounter (Signed)
 Called patient to notify him of Repatha copay - NR, LVM. Will send rx for Repatha to preferred pharmacy as previously discussed during office visit. F/u lipid panel ordered.

## 2024-09-29 NOTE — Consult Note (Signed)
 NEUROLOGY CONSULT NOTE   Date of service: September 29, 2024 Patient Name: Jason Salazar MRN:  [id number] DOB:  May 31, 1946 Chief Complaint: Right-sided numbness Requesting Provider: Ginger Lonni PARAS, *  History of Present Illness  Cayetano Mikita is a 78 y.o. male with hx of atrial fibrillation on coumadin  who was just evaluated in the emergency department for TIA recently who presents with recurrent identical symptoms of right facial numbness, now with right arm weakness as well.  He was evaluated with echocardiogram (no clear embolic source), lipid panel (LDL 172), A1c of 7.5, and it was noted that his INR was subtherapeutic at 1.8.  He has been doing well at home, however tonight he redeveloped symptoms  LKW: 7:45 PM IV Thrombolysis: No, anticoagulation EVT: No, no LVO  NIHSS components Score: Comment  1a Level of Conscious 0[]  1[]  2[]  3[]      1b LOC Questions 0[]  1[]  2[]       1c LOC Commands 0[]  1[]  2[]       2 Best Gaze 0[]  1[]  2[]       3 Visual 0[]  1[]  2[]  3[]      4 Facial Palsy 0[]  1[]  2[]  3[]      5a Motor Arm - left 0[]  1[]  2[]  3[]  4[]  UN[]    5b Motor Arm - Right 0[]  1[]  2[]  3[]  4[]  UN[]    6a Motor Leg - Left 0[]  1[]  2[]  3[]  4[]  UN[]    6b Motor Leg - Right 0[]  1[]  2[]  3[]  4[]  UN[]    7 Limb Ataxia 0[]  1[x]  2[]  UN[]      8 Sensory 0[]  1[x]  2[]  UN[]      9 Best Language 0[]  1[]  2[]  3[]      10 Dysarthria 0[]  1[]  2[]  UN[]      11 Extinct. and Inattention 0[]  1[]  2[]       TOTAL: 2      Past History   Past Medical History:  Diagnosis Date   Allergic rhinitis    Balantidiasis    Cancer (HCC)    prostate cancer treated 2007 with radiation seed placement   Diabetes (HCC)    H/O prostate cancer    Radioactive seeds   Hypercholesteremia    Hyperlipidemia    Hyperlipoproteinemia    Hypertension    Sleep apnea     Past Surgical History:  Procedure Laterality Date   cyst removed from right kidney      Family History: Family History  Problem Relation Age of  Onset   CVA Mother    Sleep apnea Brother    Thyroid  cancer Father    Prostate cancer Father     Social History  reports that he has never smoked. He has never used smokeless tobacco. He reports that he does not drink alcohol and does not use drugs.  Allergies  Allergen Reactions   Atorvastatin Other (See Comments)    Light-headedness    Medications   Current Facility-Administered Medications:    aspirin tablet 325 mg, 325 mg, Oral, Daily, Courtnay Petrilla, Aisha SQUIBB, MD   sodium chloride flush (NS) 0.9 % injection 3 mL, 3 mL, Intravenous, Once, Tegeler, Lonni PARAS, MD  Current Outpatient Medications:    allopurinol  (ZYLOPRIM ) 100 MG tablet, Take 100 mg by mouth 2 (two) times daily as needed (for gout flares)., Disp: , Rfl:    Apoaequorin (PREVAGEN PO), Take 1 tablet by mouth See admin instructions. Prevagen chewable tablets - Chew 1 tablet by mouth once a day, Disp: , Rfl:    aspirin EC 81 MG tablet, Take 81  mg by mouth in the morning., Disp: , Rfl:    b complex vitamins tablet, Take 1 tablet by mouth daily., Disp: , Rfl:    bimatoprost  (LUMIGAN ) 0.01 % SOLN, Instill 1 drop into both eyes at bedtime, Disp: 7.5 mL, Rfl: 6   Blood Pressure Monitoring (BLOOD PRESSURE CUFF) MISC, Use to check blood pressure daily., Disp: 1 each, Rfl: 0   Cholecalciferol (VITAMIN D3) 1000 units CAPS, Take 1,000 Units by mouth daily., Disp: , Rfl:    Colchicine  0.6 MG CAPS, Take 1 capsule by mouth 2 (two) times daily as needed for gout flare up, Disp: 30 capsule, Rfl: 2   Continuous Glucose Sensor (FREESTYLE LIBRE 3 PLUS SENSOR) MISC, Change sensor every 15 days., Disp: 6 each, Rfl: 1   dapagliflozin  propanediol (FARXIGA ) 10 MG TABS tablet, TAKE 1 TABLET BY MOUTH EVERY DAY, Disp: 90 tablet, Rfl: 1   DILANTIN  100 MG ER capsule, Take 3 capsules (300 mg total) by mouth at bedtime., Disp: 90 capsule, Rfl: 12   Evolocumab (REPATHA SURECLICK) 140 MG/ML SOAJ, Inject 140 mg into the skin every 14 (fourteen) days.,  Disp: 2 mL, Rfl: 2   furosemide  (LASIX ) 20 MG tablet, Take 1 tablet (20 mg total) by mouth daily., Disp: 90 tablet, Rfl: 3   ibuprofen (ADVIL) 800 MG tablet, Take 800 mg by mouth every 8 (eight) hours as needed (for pain)., Disp: , Rfl:    insulin  degludec (TRESIBA ) 100 UNIT/ML FlexTouch Pen, Inject 26 Units into the skin at bedtime., Disp: 15 mL, Rfl: 1   lacosamide  (VIMPAT ) 50 MG TABS tablet, Take 1 tablet (50 mg total) by mouth 2 (two) times daily. (Patient taking differently: Take 50 mg by mouth See admin instructions. Take 50 mg by mouth in the morning and evening), Disp: 60 tablet, Rfl: 5   lisinopril -hydrochlorothiazide  (ZESTORETIC ) 20-12.5 MG tablet, Take 1 tablet by mouth daily., Disp: , Rfl:    mometasone (NASONEX) 50 MCG/ACT nasal spray, Place 2 sprays into the nose daily as needed (for seasonal allergies)., Disp: , Rfl:    Multiple Vitamins-Minerals (ONE-A-DAY MENS 50+ ADVANTAGE PO), Take 1 tablet by mouth daily with breakfast., Disp: , Rfl:    rosuvastatin  (CRESTOR ) 20 MG tablet, Take 20 mg by mouth at bedtime., Disp: , Rfl:    tiZANidine (ZANAFLEX) 4 MG tablet, Take 4 mg by mouth 2 (two) times daily as needed (for pain)., Disp: , Rfl:    Vitamin C 250 MG TABS, Take 250 mg by mouth daily., Disp: , Rfl:    warfarin (COUMADIN ) 5 MG tablet, Take 1 tablet (5 mg total) by mouth daily. (Patient taking differently: Take 2.5-5 mg by mouth See admin instructions. Take 2.5 mg by mouth in the morning on Sun/Tues/Thurs/Sat and 5 mg on Mon/Wed/Fri), Disp: 30 tablet, Rfl: 11  Vitals   Vitals:   09/29/24 2000 09/29/24 2030 09/29/24 2044 09/29/24 2045  BP:  (!) 144/55    Pulse:  72 73 78  Resp:  18    SpO2:  100% 99% 100%  Weight: 82 kg       Body mass index is 32.02 kg/m.   Physical Exam   Constitutional: Appears well-developed and well-nourished.   Neurologic Examination    Neuro: Mental Status: Patient is awake, alert, oriented to person, place, month, year, and  situation. Patient is able to give a clear and coherent history. No signs of aphasia or neglect Cranial Nerves: II: Visual Fields are full. Pupils are equal, round, and reactive to light.  III,IV, VI: EOMI without ptosis or diploplia.  V: Facial sensation is diminished in the right face VII: Facial movement is symmetric.  VIII: hearing is intact to voice X: Uvula elevates symmetrically XII: tongue is midline without atrophy or fasciculations.  Motor: Tone is normal. Bulk is normal. 5/5 strength was present in all four extremities.  Sensory: Sensation is diminished in the right face and arm Cerebellar: He has discoordination out of proportion to weakness of the right arm       Labs/Imaging/Neurodiagnostic studies   CBC:  Recent Labs  Lab 10/16/24 2034 Oct 16, 2024 2035  WBC  --  4.6  NEUTROABS  --  2.5  HGB 16.0 14.5  HCT 47.0 46.7  MCV  --  90.0  PLT  --  161   Basic Metabolic Panel:  Lab Results  Component Value Date   NA 139 2024-10-16   K 5.3 (H) 10/16/24   CO2 23 09/21/2024   GLUCOSE 175 (H) 2024/10/16   BUN 34 (H) 2024-10-16   CREATININE 1.70 (H) Oct 16, 2024   CALCIUM  8.5 (L) 09/21/2024   GFRNONAA 54 (L) 09/21/2024   GFRAA 53 (L) 03/19/2021   Lipid Panel:  Lab Results  Component Value Date   LDLCALC 172 (H) 09/21/2024   HgbA1c:  Lab Results  Component Value Date   HGBA1C 7.5 (H) 09/20/2024   Alcohol Level     Component Value Date/Time   [facility] <15 09/20/2024 1353   INR  Lab Results  Component Value Date   INR 2.0 (H) 09/21/2024   APTT  Lab Results  Component Value Date   APTT 32 09/20/2024   AED levels:  Lab Results  Component Value Date   PHENYTOIN  16.8 09/13/2024    CT Head without contrast(Personally reviewed): Negative  CT angio Head and Neck with contrast(Personally reviewed): Stable  ASSESSMENT   Rodel Glaspy is a 78 y.o. male with recurrent right-sided numbness and discoordination.  My suspicion is that this is a  stuttering subcortical stroke as opposed to secondary to his atrial fibrillation, based on the fact that he has identical symptoms to his previous episode.  I do think he needs to be observed given I do suspect he has had an acute stroke, but no need to repeat his full stroke workup.  I would favor giving an aspirin and holding Coumadin  for now.  He has previously been on Coumadin  due to also being on Dilantin , and it appears there are plans to continue weaning Dilantin  so that he can be transition to a DOAC.  RECOMMENDATIONS  Aspirin 325 mg for now, decision on anticoagulation after MRI MRI brain No need to repeat echo or lipids He was just approved for Repatha, had elevated LFTs with statins in the past, ?  Whether these could be reattempted Continue home AEDs Stroke team to follow  ______________________________________________________________________    Signed, Aisha Seals, MD [facility]

## 2024-09-29 NOTE — Progress Notes (Signed)
 PHARMACY - ANTICOAGULATION CONSULT NOTE  Pharmacy Consult for warfarin Indication: atrial fibrillation  Allergies  Allergen Reactions   Atorvastatin Other (See Comments)    Light-headedness    Patient Measurements: Weight: 82 kg (180 lb 12.4 oz)  Vital Signs: Temp: 98.4 F (36.9 C) (10/22 2025) Temp Source: Oral (10/22 2025) BP: 150/49 (10/22 2130) Pulse Rate: 70 (10/22 2145)  Labs: Recent Labs    09/29/24 2034 09/29/24 2035  HGB 16.0 14.5  HCT 47.0 46.7  PLT  --  161  APTT  --  49*  LABPROT  --  23.9*  INR  --  2.0*  CREATININE 1.70*  --     Estimated Creatinine Clearance: 35 mL/min (A) (by C-G formula based on SCr of 1.7 mg/dL (H)).   Medical History: Past Medical History:  Diagnosis Date   Allergic rhinitis    Balantidiasis    Cancer (HCC)    prostate cancer treated 2007 with radiation seed placement   Diabetes (HCC)    H/O prostate cancer    Radioactive seeds   Hypercholesteremia    Hyperlipidemia    Hyperlipoproteinemia    Hypertension    Sleep apnea     Medications:  -Warfarin 5mg  PO Monday, Wednesday, Friday; 2.5mg  PO all other days (Last dose 10/22)  Assessment: 4 yoM presented with R-sided numbness brought in as a code stroke. Pharmacy consulted to dose PTA warfarin for atrial fibrillation.  -CBC WNL -INR 2 -Imaging negative for stroke;   Goal of Therapy:  INR 2-3 Monitor platelets by anticoagulation protocol: Yes   Plan:  -Continue warfarin 5mg  PO Monday, Wednesday, Friday; 2.5mg  PO all other days  -CBC, INR daily   Lynwood Poplar, PharmD, BCPS Clinical Pharmacist 09/29/2024 11:09 PM

## 2024-09-29 NOTE — Assessment & Plan Note (Addendum)
 Rate controlled - EKG ordered - Coumadin  per pharmacy recs

## 2024-09-29 NOTE — ED Triage Notes (Signed)
 Patient BIB [facility] under Code Stroke with complaints of right side facial numbness that started at 1915.  Patient CAOx4 upon arrival.

## 2024-09-29 NOTE — ED Notes (Signed)
 Patient reports he was having worsening right side facial, arm and leg numbness.  MD Tegeler made aware and at bedside.

## 2024-09-29 NOTE — ED Provider Notes (Signed)
 [place] EMERGENCY DEPARTMENT AT [facility] Provider Note   CSN: [id number] Arrival date & time: 09/29/24  2025     Patient presents with: No chief complaint on file.   Rudolpho Claxton is a 78 y.o. male.   The history is provided by the patient and medical records. No language interpreter was used.  Neurologic Problem This is a recurrent problem. The current episode started less than 1 hour ago. The problem occurs constantly. The problem has been gradually worsening. Pertinent negatives include no chest pain, no abdominal pain, no headaches and no shortness of breath. Nothing aggravates the symptoms. Nothing relieves the symptoms. He has tried nothing for the symptoms. The treatment provided no relief.       Prior to Admission medications   Medication Sig Start Date End Date Taking? Authorizing Provider  allopurinol  (ZYLOPRIM ) 100 MG tablet Take 100 mg by mouth 2 (two) times daily as needed (for gout flares).    [provider]  Apoaequorin (PREVAGEN PO) Take 1 tablet by mouth See admin instructions. Prevagen chewable tablets - Chew 1 tablet by mouth once a day    [provider]  aspirin EC 81 MG tablet Take 81 mg by mouth in the morning.    [provider]  b complex vitamins tablet Take 1 tablet by mouth daily.    [provider]  bimatoprost  (LUMIGAN ) 0.01 % SOLN Instill 1 drop into both eyes at bedtime 01/02/22     Blood Pressure Monitoring (BLOOD PRESSURE CUFF) MISC Use to check blood pressure daily. 08/31/24   Vicci Barnie NOVAK, MD  Cholecalciferol (VITAMIN D3) 1000 units CAPS Take 1,000 Units by mouth daily. 12/09/22   [provider]  Colchicine  0.6 MG CAPS Take 1 capsule by mouth 2 (two) times daily as needed for gout flare up 02/04/22     Continuous Glucose Sensor (FREESTYLE LIBRE 3 PLUS SENSOR) MISC Change sensor every 15 days. 05/24/24   Nida, Gebreselassie W, MD  dapagliflozin  propanediol (FARXIGA ) 10 MG TABS tablet  TAKE 1 TABLET BY MOUTH EVERY DAY 04/08/24   Nida, Gebreselassie W, MD  DILANTIN  100 MG ER capsule Take 3 capsules (300 mg total) by mouth at bedtime. 09/13/24   Penumalli, Vikram R, MD  Evolocumab (REPATHA SURECLICK) 140 MG/ML SOAJ Inject 140 mg into the skin every 14 (fourteen) days. 09/29/24   Chandrasekhar, Stanly LABOR, MD  furosemide  (LASIX ) 20 MG tablet Take 1 tablet (20 mg total) by mouth daily. 03/18/23   Santo Stanly A, MD  ibuprofen (ADVIL) 800 MG tablet Take 800 mg by mouth every 8 (eight) hours as needed (for pain).    [provider]  insulin  degludec (TRESIBA ) 100 UNIT/ML FlexTouch Pen Inject 26 Units into the skin at bedtime. 09/23/24   Nida, Gebreselassie W, MD  lacosamide  (VIMPAT ) 50 MG TABS tablet Take 1 tablet (50 mg total) by mouth 2 (two) times daily. Patient taking differently: Take 50 mg by mouth See admin instructions. Take 50 mg by mouth in the morning and evening 09/13/24   Penumalli, Vikram R, MD  lisinopril -hydrochlorothiazide  (ZESTORETIC ) 20-12.5 MG tablet Take 1 tablet by mouth daily.    [provider]  mometasone (NASONEX) 50 MCG/ACT nasal spray Place 2 sprays into the nose daily as needed (for seasonal allergies). 06/21/20   [provider]  Multiple Vitamins-Minerals (ONE-A-DAY MENS 50+ ADVANTAGE PO) Take 1 tablet by mouth daily with breakfast.    [provider]  rosuvastatin  (CRESTOR ) 20 MG tablet Take 20  mg by mouth at bedtime.    [provider]  tiZANidine (ZANAFLEX) 4 MG tablet Take 4 mg by mouth 2 (two) times daily as needed (for pain). 04/17/23   [provider]  Vitamin C 250 MG TABS Take 250 mg by mouth daily. 12/09/22   [provider]  warfarin (COUMADIN ) 5 MG tablet Take 1 tablet (5 mg total) by mouth daily. Patient taking differently: Take 2.5-5 mg by mouth See admin instructions. Take 2.5 mg by mouth in the morning on Sun/Tues/Thurs/Sat and 5 mg on Mon/Wed/Fri 07/30/24   Kennyth Chew, MD     Allergies: Atorvastatin    Review of Systems  Constitutional:  Negative for chills, fatigue and fever.  HENT:  Negative for congestion.   Eyes:  Negative for pain and visual disturbance.  Respiratory:  Negative for cough, choking, chest tightness and shortness of breath.   Cardiovascular:  Negative for chest pain.  Gastrointestinal:  Negative for abdominal pain, constipation, diarrhea, nausea and vomiting.  Genitourinary:  Negative for dysuria.  Musculoskeletal:  Negative for back pain and neck pain.  Skin:  Negative for rash and wound.  Neurological:  Positive for numbness. Negative for facial asymmetry, speech difficulty, weakness and headaches.  Psychiatric/Behavioral:  Negative for agitation and confusion.   All other systems reviewed and are negative.   Updated Vital Signs Wt 82 kg   BMI 32.02 kg/m   Physical Exam Vitals and nursing note reviewed.  Constitutional:      General: He is not in acute distress.    Appearance: He is well-developed. He is not ill-appearing, toxic-appearing or diaphoretic.  HENT:     Head: Normocephalic and atraumatic.     Nose: No congestion or rhinorrhea.     Mouth/Throat:     Mouth: Mucous membranes are moist.     Pharynx: No oropharyngeal exudate or posterior oropharyngeal erythema.  Eyes:     Extraocular Movements: Extraocular movements intact.     Conjunctiva/sclera: Conjunctivae normal.     Pupils: Pupils are equal, round, and reactive to light.  Cardiovascular:     Rate and Rhythm: Normal rate and regular rhythm.     Heart sounds: No murmur heard. Pulmonary:     Effort: Pulmonary effort is normal. No respiratory distress.     Breath sounds: Normal breath sounds.  Abdominal:     Palpations: Abdomen is soft.     Tenderness: There is no abdominal tenderness. There is no guarding or rebound.  Musculoskeletal:        General: No swelling or tenderness.     Cervical back: Neck supple.     Right lower leg: No edema.     Left  lower leg: No edema.  Skin:    General: Skin is warm and dry.     Capillary Refill: Capillary refill takes less than 2 seconds.     Findings: No erythema or rash.  Neurological:     General: No focal deficit present.     Mental Status: He is alert.     Sensory: Sensory deficit present.     Motor: No weakness.  Psychiatric:        Mood and Affect: Mood normal.     (all labs ordered are listed, but only abnormal results are displayed) Labs Reviewed  PROTIME-INR - Abnormal; Notable for the following components:      Result Value   Prothrombin Time 23.9 (*)    INR 2.0 (*)    All other components within normal  limits  APTT - Abnormal; Notable for the following components:   aPTT 49 (*)    All other components within normal limits  I-STAT CHEM 8, ED - Abnormal; Notable for the following components:   Potassium 5.3 (*)    BUN 34 (*)    Creatinine, Ser 1.70 (*)    Glucose, Bld 175 (*)    Calcium , Ion 1.02 (*)    All other components within normal limits  CBG MONITORING, ED - Abnormal; Notable for the following components:   Glucose-Capillary 173 (*)    All other components within normal limits  CBC  DIFFERENTIAL  ETHANOL  COMPREHENSIVE METABOLIC PANEL WITH GFR  TSH  CBC  MAGNESIUM  PROTIME-INR  CBC    EKG: None  Radiology: MR BRAIN WO CONTRAST Result Date: 09/29/2024 EXAM: MRI BRAIN WITHOUT CONTRAST 09/29/2024 10:17:29 PM TECHNIQUE: Multiplanar multisequence MRI of the head/brain was performed without the administration of intravenous contrast. COMPARISON: 09/20/2024 CLINICAL HISTORY: Neuro deficit, acute, stroke suspected. FINDINGS: BRAIN AND VENTRICLES: No acute infarct. No intracranial hemorrhage. No mass. No midline shift. No hydrocephalus. Multifocal hyperintense T2-weighted signal within the cerebral white matter, most commonly due to chronic small vessel disease. Mild volume loss. The sella is unremarkable. Normal flow voids. ORBITS: No acute abnormality. SINUSES  AND MASTOIDS: No acute abnormality. BONES AND SOFT TISSUES: Normal marrow signal. No acute soft tissue abnormality. IMPRESSION: 1. No acute intracranial abnormality. 2. Multifocal T2 hyperintense white matter signal compatible with chronic small vessel disease. 3. Mild cerebral volume loss. Electronically signed by: Franky Stanford MD 09/29/2024 10:33 PM EDT RP Workstation: [id number]   CT HEAD CODE STROKE WO CONTRAST Result Date: 09/29/2024 EXAM: CTA Head and Neck with Intravenous Contrast. CT Head without Contrast. CLINICAL HISTORY: Neuro deficit, acute, stroke suspected. TECHNIQUE: Axial CTA images of the head and neck performed with intravenous contrast. MIP reconstructed images were created and reviewed. Axial computed tomography images of the head/brain performed without intravenous contrast. Note: Per PQRS, the description of internal carotid artery percent stenosis, including 0 percent or normal exam, is based on [place] Symptomatic Carotid Endarterectomy Trial (NASCET) criteria. Dose reduction technique was used including one or more of the following: automated exposure control, adjustment of mA and kV according to patient size, and/or iterative reconstruction. CONTRAST: With and without; 75 mL (iohexol  (OMNIPAQUE ) 350 MG/ML injection 75 mL IOHEXOL  350 MG/ML SOLN) COMPARISON: 09/20/2024 FINDINGS: CT HEAD: BRAIN: No acute intraparenchymal hemorrhage. No mass lesion. No CT evidence for acute territorial infarct. No midline shift or extra-axial collection. Mild volume loss and chronic mild white matter changes. VENTRICLES: No hydrocephalus. ORBITS: The orbits are unremarkable. SINUSES AND MASTOIDS: The paranasal sinuses and mastoid air cells are clear. CTA NECK: COMMON CAROTID ARTERIES: No significant stenosis. No dissection or occlusion. INTERNAL CAROTID ARTERIES: No stenosis by NASCET criteria. No dissection or occlusion. VERTEBRAL ARTERIES: No significant stenosis. No dissection or occlusion. CTA  HEAD: ANTERIOR CEREBRAL ARTERIES: No significant stenosis. No occlusion. No aneurysm. MIDDLE CEREBRAL ARTERIES: No significant stenosis. No occlusion. No aneurysm. POSTERIOR CEREBRAL ARTERIES: No significant stenosis. No occlusion. No aneurysm. BASILAR ARTERY: No significant stenosis. No occlusion. No aneurysm. OTHER: VASCULATURE: Aspect is 10 aortic atherosclerosis. SOFT TISSUES: No acute finding. No masses or lymphadenopathy. BONES: No acute osseous abnormality. IMPRESSION: 1. No acute intracranial hemorrhage or acute ischemic change. ASPECTS is 10. 2. Mild cerebral volume loss and chronic mild white matter changes. 3. No emergent large vessel occlusion. Findings discussed with Dr. Lonni Baneen Wieseler at 8:55 PM on 09/29/2024. Electronically  signed by: Franky Stanford MD 09/29/2024 08:56 PM EDT RP Workstation: [id number]   CT ANGIO HEAD NECK W WO CM (CODE STROKE) Result Date: 09/29/2024 EXAM: CTA Head and Neck with Intravenous Contrast. CT Head without Contrast. CLINICAL HISTORY: Neuro deficit, acute, stroke suspected. TECHNIQUE: Axial CTA images of the head and neck performed with intravenous contrast. MIP reconstructed images were created and reviewed. Axial computed tomography images of the head/brain performed without intravenous contrast. Note: Per PQRS, the description of internal carotid artery percent stenosis, including 0 percent or normal exam, is based on [place] Symptomatic Carotid Endarterectomy Trial (NASCET) criteria. Dose reduction technique was used including one or more of the following: automated exposure control, adjustment of mA and kV according to patient size, and/or iterative reconstruction. CONTRAST: With and without; 75 mL (iohexol  (OMNIPAQUE ) 350 MG/ML injection 75 mL IOHEXOL  350 MG/ML SOLN) COMPARISON: 09/20/2024 FINDINGS: CT HEAD: BRAIN: No acute intraparenchymal hemorrhage. No mass lesion. No CT evidence for acute territorial infarct. No midline shift or extra-axial  collection. Mild volume loss and chronic mild white matter changes. VENTRICLES: No hydrocephalus. ORBITS: The orbits are unremarkable. SINUSES AND MASTOIDS: The paranasal sinuses and mastoid air cells are clear. CTA NECK: COMMON CAROTID ARTERIES: No significant stenosis. No dissection or occlusion. INTERNAL CAROTID ARTERIES: No stenosis by NASCET criteria. No dissection or occlusion. VERTEBRAL ARTERIES: No significant stenosis. No dissection or occlusion. CTA HEAD: ANTERIOR CEREBRAL ARTERIES: No significant stenosis. No occlusion. No aneurysm. MIDDLE CEREBRAL ARTERIES: No significant stenosis. No occlusion. No aneurysm. POSTERIOR CEREBRAL ARTERIES: No significant stenosis. No occlusion. No aneurysm. BASILAR ARTERY: No significant stenosis. No occlusion. No aneurysm. OTHER: VASCULATURE: Aspect is 10 aortic atherosclerosis. SOFT TISSUES: No acute finding. No masses or lymphadenopathy. BONES: No acute osseous abnormality. IMPRESSION: 1. No acute intracranial hemorrhage or acute ischemic change. ASPECTS is 10. 2. Mild cerebral volume loss and chronic mild white matter changes. 3. No emergent large vessel occlusion. Findings discussed with Dr. Lonni Sandria Mcenroe at 8:55 PM on 09/29/2024. Electronically signed by: Franky Stanford MD 09/29/2024 08:56 PM EDT RP Workstation: [id number]     Procedures   Medications Ordered in the ED  sodium chloride flush (NS) 0.9 % injection 3 mL (has no administration in time range)  aspirin tablet 325 mg (325 mg Oral Given 09/29/24 2132)  sodium chloride 0.9 % bolus 500 mL (has no administration in time range)  acetaminophen (TYLENOL) tablet 650 mg (has no administration in time range)    Or  acetaminophen (TYLENOL) suppository 650 mg (has no administration in time range)  allopurinol  (ZYLOPRIM ) tablet 100 mg (has no administration in time range)  latanoprost (XALATAN) 0.005 % ophthalmic solution 1 drop (has no administration in time range)  dapagliflozin  propanediol (FARXIGA )  tablet 10 mg (has no administration in time range)  phenytoin  (DILANTIN ) ER capsule 300 mg (has no administration in time range)  furosemide  (LASIX ) tablet 20 mg (has no administration in time range)  insulin  aspart (novoLOG) injection 0-15 Units (has no administration in time range)  lacosamide  (VIMPAT ) tablet 50 mg (has no administration in time range)  rosuvastatin  (CRESTOR ) tablet 20 mg (has no administration in time range)  tiZANidine (ZANAFLEX) tablet 4 mg (has no administration in time range)  iohexol  (OMNIPAQUE ) 350 MG/ML injection 75 mL (75 mLs Intravenous Contrast Given 09/29/24 2042)                                    Medical  Decision Making Amount and/or Complexity of Data Reviewed Labs: ordered. Radiology: ordered.  Risk OTC drugs. Prescription drug management. Decision regarding hospitalization.    Jasper Hanf is a 78 y.o. male with past medical history significant for previous prostate cancer, epilepsy, CKD, atrial fibrillation on Coumadin  therapy, diabetes, previous CVA and recent TIA, CHF, hypertension, and sleep apnea who presents as a code stroke.  According to EMS, at 1945, patient had onset of right facial numbness and was activated as a code stroke as this is similar symptom he had with recent TIA.  Patient quickly taken to scanner for imaging.  On my initial exam, he has symmetric smile and clear speech.  Pupil symmetric and reactive.  Lungs clear.  Chest nontender.  Abdomen nontender.  Will do further neurologic exam when he returns from imaging and have neurology help determine disposition.     On my further exam he has numbness in his right arm and right face and has some difficulty with coordination with finger-nose-finger testing in the right arm.  He is right-handed.  He did not have leg symptoms on arrival.  Neurology says that he needs readmission as he is concerned he is having a small stroke but at this time does not need TNK.  Neurology  recommend giving him 500 of IV fluid and they ordered aspirin for him.  Approximately 9:20 PM his symptoms began to worsen with now numbness in his right leg to go along with his right arm and right face.  No weakness on my reassessment.  Dr. Michaela with neurology is made aware of this change and the plan is still to admit and get MRI.  Patient will be admitted for further management.     Final diagnoses:  Cerebrovascular accident (CVA), unspecified mechanism (HCC)    Clinical Impression: 1. Cerebrovascular accident (CVA), unspecified mechanism (HCC)     Disposition: Admit  This note was prepared with assistance of Dragon voice recognition software. Occasional wrong-word or sound-a-like substitutions may have occurred due to the inherent limitations of voice recognition software.      Male Iafrate, Lonni PARAS, MD 09/29/24 [id number]

## 2024-09-29 NOTE — Assessment & Plan Note (Addendum)
-   Farxiga  10mg  daily - Sliding Scale Insulin , moderate scale - Holding home regimen of tresiba  until PO improves

## 2024-09-29 NOTE — ED Notes (Signed)
 Patient to MRI.

## 2024-09-29 NOTE — Code Documentation (Signed)
 Responded to Code Stroke called at 2015 for R sided facial numbness, LSN-1945. Pt arrived at 2025, CBG-173, NIH-2 for R arm ataxia and R sided sensory deficit, CT head negative for acute changes. TNK not given-pt on warfarin(INR-2). Plan to admit. Please complete VS/neuro checks(including NIH) q2h x 12h, then q4h.

## 2024-09-30 ENCOUNTER — Other Ambulatory Visit (HOSPITAL_BASED_OUTPATIENT_CLINIC_OR_DEPARTMENT_OTHER): Payer: Self-pay

## 2024-09-30 ENCOUNTER — Ambulatory Visit: Payer: Self-pay

## 2024-09-30 DIAGNOSIS — Z794 Long term (current) use of insulin: Secondary | ICD-10-CM

## 2024-09-30 DIAGNOSIS — E1151 Type 2 diabetes mellitus with diabetic peripheral angiopathy without gangrene: Secondary | ICD-10-CM | POA: Diagnosis not present

## 2024-09-30 DIAGNOSIS — Z7901 Long term (current) use of anticoagulants: Secondary | ICD-10-CM

## 2024-09-30 DIAGNOSIS — G459 Transient cerebral ischemic attack, unspecified: Secondary | ICD-10-CM

## 2024-09-30 DIAGNOSIS — E785 Hyperlipidemia, unspecified: Secondary | ICD-10-CM

## 2024-09-30 DIAGNOSIS — I4891 Unspecified atrial fibrillation: Secondary | ICD-10-CM | POA: Diagnosis not present

## 2024-09-30 LAB — COMPREHENSIVE METABOLIC PANEL WITH GFR
ALT: 24 U/L (ref 0–44)
AST: 25 U/L (ref 15–41)
Albumin: 3.3 g/dL — ABNORMAL LOW (ref 3.5–5.0)
Alkaline Phosphatase: 109 U/L (ref 38–126)
Anion gap: 9 (ref 5–15)
BUN: 26 mg/dL — ABNORMAL HIGH (ref 8–23)
CO2: 22 mmol/L (ref 22–32)
Calcium: 8.4 mg/dL — ABNORMAL LOW (ref 8.9–10.3)
Chloride: 106 mmol/L (ref 98–111)
Creatinine, Ser: 1.46 mg/dL — ABNORMAL HIGH (ref 0.61–1.24)
GFR, Estimated: 50 mL/min — ABNORMAL LOW (ref >60)
Glucose, Bld: 130 mg/dL — ABNORMAL HIGH (ref 70–99)
Potassium: 4.3 mmol/L (ref 3.5–5.1)
Sodium: 137 mmol/L (ref 135–145)
Total Bilirubin: 0.8 mg/dL (ref 0.0–1.2)
Total Protein: 6.3 g/dL — ABNORMAL LOW (ref 6.5–8.1)

## 2024-09-30 LAB — CBC
HCT: 46.8 % (ref 39.0–52.0)
Hemoglobin: 14.5 g/dL (ref 13.0–17.0)
MCH: 28.1 pg (ref 26.0–34.0)
MCHC: 31 g/dL (ref 30.0–36.0)
MCV: 90.7 fL (ref 80.0–100.0)
Platelets: 130 K/uL — ABNORMAL LOW (ref 150–400)
RBC: 5.16 MIL/uL (ref 4.22–5.81)
RDW: 14.1 % (ref 11.5–15.5)
WBC: 4.5 K/uL (ref 4.0–10.5)
nRBC: 0 % (ref 0.0–0.2)

## 2024-09-30 LAB — PROTIME-INR
INR: 1.7 — ABNORMAL HIGH (ref 0.8–1.2)
Prothrombin Time: 21 s — ABNORMAL HIGH (ref 11.4–15.2)

## 2024-09-30 LAB — TSH: TSH: 2.422 u[IU]/mL (ref 0.350–4.500)

## 2024-09-30 LAB — MAGNESIUM: Magnesium: 2.1 mg/dL (ref 1.7–2.4)

## 2024-09-30 LAB — CBG MONITORING, ED: Glucose-Capillary: 96 mg/dL (ref 70–99)

## 2024-09-30 MED ORDER — LISINOPRIL-HYDROCHLOROTHIAZIDE 20-12.5 MG PO TABS: 1.0000 | ORAL_TABLET | Freq: Every day | ORAL | Status: DC

## 2024-09-30 MED ORDER — HYDROCHLOROTHIAZIDE 12.5 MG PO TABS
12.5000 mg | ORAL_TABLET | Freq: Every day | ORAL | Status: DC
  Administered 2024-09-30 – 2024-10-01 (×2): 12.5 mg via ORAL
  Filled 2024-09-30 (×2): qty 1

## 2024-09-30 MED ORDER — LISINOPRIL 20 MG PO TABS
20.0000 mg | ORAL_TABLET | Freq: Every day | ORAL | Status: DC
  Administered 2024-09-30 – 2024-10-01 (×2): 20 mg via ORAL
  Filled 2024-09-30 (×2): qty 1

## 2024-09-30 MED ORDER — DOCUSATE SODIUM 100 MG PO CAPS
100.0000 mg | ORAL_CAPSULE | Freq: Once | ORAL | Status: AC
  Administered 2024-09-30: 100 mg via ORAL
  Filled 2024-09-30: qty 1

## 2024-09-30 NOTE — Assessment & Plan Note (Addendum)
 Telemetry reviewed and showed Mobitz type I AV block, even when telemetry reported A-fib -Initial neurology note with recommendations to hold warfarin, but this was continued, reach out to neurology about recommendation with no response yet, will continue warfarin at this time pending neurology attending attestation -Continue telemetry

## 2024-09-30 NOTE — Assessment & Plan Note (Addendum)
 Gout - Continue home Allopurinol  100mg  BID PRN Seizure history - Continue home Vimpat  50mg  BID and Dilantin  ER 300mg  at bedtime. CHF - Continue home Lasix  20mg  daily HLD - Continue home Crestor  20mg  daily. Has been approved for Repatha but not yet started this. Muscle cramps - Continue home Tizanidine 4mg  PRN BID HTN - restart Lisinopril -hydrochlorothiazide  20-12.5mg  given no CVA

## 2024-09-30 NOTE — Progress Notes (Addendum)
 STROKE TEAM PROGRESS NOTE    SIGNIFICANT HOSPITAL EVENTS: 10/23 - presented from home with complaints of right-sided facial, arm, and leg numbness.  No acute abnormality on MRI or CT. MRI showed no acute infarct and only chronic small vessel disease changes..  CT angiogram shows no large vessel stenosis or occlusion.  INTERIM HISTORY/SUBJECTIVE: Patient seen at bedside. Patient denies sensory deficits. He seems somewhat confused when recounting events before hospital, and states he has difficulty remembering. Spoke with patient's wife via phone. Wife states patient was sitting on couch, when he had onset of right-sided facial, arm, and leg numbness. Denies motor deficits or balance issues. Wife does mention patient has baseline chronic lower back pain, which affects his gait at times.   OBJECTIVE:  CBC    Component Value Date/Time   WBC 4.5 09/30/2024 0421   RBC 5.16 09/30/2024 0421   HGB 14.5 09/30/2024 0421   HGB 15.4 09/18/2022 0939   HCT 46.8 09/30/2024 0421   HCT 46.5 09/18/2022 0939   PLT 130 (L) 09/30/2024 0421   PLT 155 09/18/2022 0939   MCV 90.7 09/30/2024 0421   MCV 84 09/18/2022 0939   MCH 28.1 09/30/2024 0421   MCHC 31.0 09/30/2024 0421   RDW 14.1 09/30/2024 0421   RDW 12.6 09/18/2022 0939   LYMPHSABS 1.4 09/29/2024 2035   LYMPHSABS 1.3 09/18/2022 0939   MONOABS 0.6 09/29/2024 2035   EOSABS 0.0 09/29/2024 2035   EOSABS 0.0 09/18/2022 0939   BASOSABS 0.0 09/29/2024 2035   BASOSABS 0.0 09/18/2022 0939    BMET    Component Value Date/Time   NA 137 09/29/2024 2308   NA 139 08/31/2024 1213   K 4.3 09/29/2024 2308   CL 106 09/29/2024 2308   CO2 22 09/29/2024 2308   GLUCOSE 130 (H) 09/29/2024 2308   BUN 26 (H) 09/29/2024 2308   BUN 28 (H) 08/31/2024 1213   CREATININE 1.46 (H) 09/29/2024 2308   CREATININE 1.32 (H) 11/12/2022 1031   CALCIUM  8.4 (L) 09/29/2024 2308   EGFR 44 (L) 08/31/2024 1213   GFRNONAA 50 (L) 09/29/2024 2308   GFRNONAA 46 (L) 03/19/2021  0913    IMAGING past 24 hours MR BRAIN WO CONTRAST Result Date: 09/29/2024 EXAM: MRI BRAIN WITHOUT CONTRAST 09/29/2024 10:17:29 PM TECHNIQUE: Multiplanar multisequence MRI of the head/brain was performed without the administration of intravenous contrast. COMPARISON: 09/20/2024 CLINICAL HISTORY: Neuro deficit, acute, stroke suspected. FINDINGS: BRAIN AND VENTRICLES: No acute infarct. No intracranial hemorrhage. No mass. No midline shift. No hydrocephalus. Multifocal hyperintense T2-weighted signal within the cerebral white matter, most commonly due to chronic small vessel disease. Mild volume loss. The sella is unremarkable. Normal flow voids. ORBITS: No acute abnormality. SINUSES AND MASTOIDS: No acute abnormality. BONES AND SOFT TISSUES: Normal marrow signal. No acute soft tissue abnormality. IMPRESSION: 1. No acute intracranial abnormality. 2. Multifocal T2 hyperintense white matter signal compatible with chronic small vessel disease. 3. Mild cerebral volume loss. Electronically signed by: Franky Stanford MD 09/29/2024 10:33 PM EDT RP Workstation: [id number]   CT HEAD CODE STROKE WO CONTRAST Result Date: 09/29/2024 EXAM: CTA Head and Neck with Intravenous Contrast. CT Head without Contrast. CLINICAL HISTORY: Neuro deficit, acute, stroke suspected. TECHNIQUE: Axial CTA images of the head and neck performed with intravenous contrast. MIP reconstructed images were created and reviewed. Axial computed tomography images of the head/brain performed without intravenous contrast. Note: Per PQRS, the description of internal carotid artery percent stenosis, including 0 percent or normal exam, is based on [place]  Symptomatic Carotid Endarterectomy Trial (NASCET) criteria. Dose reduction technique was used including one or more of the following: automated exposure control, adjustment of mA and kV according to patient size, and/or iterative reconstruction. CONTRAST: With and without; 75 mL (iohexol  (OMNIPAQUE )  350 MG/ML injection 75 mL IOHEXOL  350 MG/ML SOLN) COMPARISON: 09/20/2024 FINDINGS: CT HEAD: BRAIN: No acute intraparenchymal hemorrhage. No mass lesion. No CT evidence for acute territorial infarct. No midline shift or extra-axial collection. Mild volume loss and chronic mild white matter changes. VENTRICLES: No hydrocephalus. ORBITS: The orbits are unremarkable. SINUSES AND MASTOIDS: The paranasal sinuses and mastoid air cells are clear. CTA NECK: COMMON CAROTID ARTERIES: No significant stenosis. No dissection or occlusion. INTERNAL CAROTID ARTERIES: No stenosis by NASCET criteria. No dissection or occlusion. VERTEBRAL ARTERIES: No significant stenosis. No dissection or occlusion. CTA HEAD: ANTERIOR CEREBRAL ARTERIES: No significant stenosis. No occlusion. No aneurysm. MIDDLE CEREBRAL ARTERIES: No significant stenosis. No occlusion. No aneurysm. POSTERIOR CEREBRAL ARTERIES: No significant stenosis. No occlusion. No aneurysm. BASILAR ARTERY: No significant stenosis. No occlusion. No aneurysm. OTHER: VASCULATURE: Aspect is 10 aortic atherosclerosis. SOFT TISSUES: No acute finding. No masses or lymphadenopathy. BONES: No acute osseous abnormality. IMPRESSION: 1. No acute intracranial hemorrhage or acute ischemic change. ASPECTS is 10. 2. Mild cerebral volume loss and chronic mild white matter changes. 3. No emergent large vessel occlusion. Findings discussed with Dr. Lonni Tegeler at 8:55 PM on 09/29/2024. Electronically signed by: Franky Stanford MD 09/29/2024 08:56 PM EDT RP Workstation: [id number]   CT ANGIO HEAD NECK W WO CM (CODE STROKE) Result Date: 09/29/2024 EXAM: CTA Head and Neck with Intravenous Contrast. CT Head without Contrast. CLINICAL HISTORY: Neuro deficit, acute, stroke suspected. TECHNIQUE: Axial CTA images of the head and neck performed with intravenous contrast. MIP reconstructed images were created and reviewed. Axial computed tomography images of the head/brain performed without  intravenous contrast. Note: Per PQRS, the description of internal carotid artery percent stenosis, including 0 percent or normal exam, is based on [place] Symptomatic Carotid Endarterectomy Trial (NASCET) criteria. Dose reduction technique was used including one or more of the following: automated exposure control, adjustment of mA and kV according to patient size, and/or iterative reconstruction. CONTRAST: With and without; 75 mL (iohexol  (OMNIPAQUE ) 350 MG/ML injection 75 mL IOHEXOL  350 MG/ML SOLN) COMPARISON: 09/20/2024 FINDINGS: CT HEAD: BRAIN: No acute intraparenchymal hemorrhage. No mass lesion. No CT evidence for acute territorial infarct. No midline shift or extra-axial collection. Mild volume loss and chronic mild white matter changes. VENTRICLES: No hydrocephalus. ORBITS: The orbits are unremarkable. SINUSES AND MASTOIDS: The paranasal sinuses and mastoid air cells are clear. CTA NECK: COMMON CAROTID ARTERIES: No significant stenosis. No dissection or occlusion. INTERNAL CAROTID ARTERIES: No stenosis by NASCET criteria. No dissection or occlusion. VERTEBRAL ARTERIES: No significant stenosis. No dissection or occlusion. CTA HEAD: ANTERIOR CEREBRAL ARTERIES: No significant stenosis. No occlusion. No aneurysm. MIDDLE CEREBRAL ARTERIES: No significant stenosis. No occlusion. No aneurysm. POSTERIOR CEREBRAL ARTERIES: No significant stenosis. No occlusion. No aneurysm. BASILAR ARTERY: No significant stenosis. No occlusion. No aneurysm. OTHER: VASCULATURE: Aspect is 10 aortic atherosclerosis. SOFT TISSUES: No acute finding. No masses or lymphadenopathy. BONES: No acute osseous abnormality. IMPRESSION: 1. No acute intracranial hemorrhage or acute ischemic change. ASPECTS is 10. 2. Mild cerebral volume loss and chronic mild white matter changes. 3. No emergent large vessel occlusion. Findings discussed with Dr. Lonni Tegeler at 8:55 PM on 09/29/2024. Electronically signed by: Franky Stanford MD 09/29/2024  08:56 PM EDT RP Workstation: [id number]  Vitals:   09/30/24 0845 09/30/24 1015 09/30/24 1133 09/30/24 1138  BP:  (!) 152/73  135/80  Pulse: 83 72    Resp: 20 12    Temp:   98.4 F (36.9 C)   TempSrc:   Oral   SpO2: 100% 100%    Weight:         PHYSICAL EXAM: General:  Alert, well-nourished, well-developed patient in no acute distress Psych:  Mood and affect appropriate for situation CV: Regular rate and rhythm on monitor Respiratory: Regular, unlabored respirations on room air   NEURO: Mental Status: Alert, somewhat confused and states he is having difficulty remembering details of what symptoms he was having yesterday. Speech/Language: speech is without dysarthria or aphasia.  Naming, repetition, fluency, and comprehension intact.  Cranial Nerves:  II: PERRL. Visual fields full.  III, IV, VI: EOMI. Eyelids elevate symmetrically.  V: Sensation is intact to light touch and symmetrical to face.  VII: Face is symmetrical resting and smiling VIII: hearing intact to voice. IX, X: Palate elevates symmetrically. Phonation is normal.  KP:Dynloizm shrug 5/5. XII: tongue is midline without fasciculations. Motor: 5/5 strength to all muscle groups tested.  Tone: is normal and bulk is normal Sensation- Intact to light touch bilaterally. Extinction absent to light touch to DSS.   Coordination: FTN intact bilaterally, HKS: no ataxia in BLE.No drift.  Gait- deferred  Most Recent NIH 0   ASSESSMENT/PLAN:  Mr. Rameses Ou is a 78 y.o. male with history of previous prostate cancer, epilepsy, CKD, atrial fibrillation on Coumadin  therapy, diabetes, previous CVA and recent TIA, CHF, hypertension, and sleep apnea admitted for right-sided facial, arm, and leg numbness. NIH on Admission 2.  Most likely etiology of the patient's presenting symptoms is a TIA. Symptoms were purely sensory, with no motor deficits noted on examination or reported by the patient. If the TIA were secondary to  a cardiac source, additional deficits beyond sensory symptoms would be expected. TIA most likely secondary to small vessel disease. As such no further changes to anticoagulation regimen at this time. Recommend continuing home Warfarin. Per chart review, the patient is currently being weaned off dilantin  by outpatient neurology, with the goal of transitioning from Warfarin to Eliquis once dilantin  taper is complete.  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 Small vessel disease. Atrophy. ASPECTS 10.    CTA head & neck: No significant stenosis. No occlusion. No aneurysm.  MRI: No acute abnormality. Multifocal T2 hyperintense white matter signal compatible with chronic small vessel disease. 2D Echo EF 60-65%, normal left atrial size LDL 172 HgbA1c 7.5 VTE prophylaxis - warfarin Patient taking aspirin 325 mg daily and warfarin daily prior to admission, now on aspirin 325 mg daily and warfarin daily  Therapy recommendations:  Pending Disposition:  likely home  Hx of Stroke/TIA Patient admitted with TIA approximately 1 week ago with symptoms of right sided facial numbness that lasted for 5 minutes then resolved. Home medications of warfarin and aspirin were continued.  Atrial fibrillation Home Meds: continue Warfarin INR 1.8 Continue telemetry monitoring  Hypertension Home meds: lisinopril  Goal: normotension <130/80  Hyperlipidemia Home meds: rosuvastatin  20 mg, resumed in hospital LDL 172, goal < 70 Continue statin at discharge  Diabetes Type II Uncontrolled Home meds: Insulin  HgbA1c 7.5, goal < 7.0 CBGs SSI Recommend close follow-up with PCP for better DM control  Other Stroke Risk Factors Obesity, Body mass index is 32.02 kg/m., BMI >/= 30 associated with  increased stroke risk, recommend weight loss, diet and exercise as appropriate  Family hx stroke (mother) Congestive heart failure Obstructive sleep apnea,  on CPAP at home   Other Active Problems Seizure disorder - Taking vimpat , dilantin . Per chart review, patient being weaned off of dilantin  by outpatient neurology, with hope he will be able to transition from Warfarin to Eliquis once dilantin  taper is complete.  Hospital day # 1  Ashley Gravely, MD, PGY-1 I have personally obtained history,examined this patient, reviewed notes, independently viewed imaging studies, participated in medical decision making and plan of care.ROS completed by me personally and pertinent positives fully documented  I have made any additions or clarifications directly to the above note. Agree with note above.  Patient presented with transient paresthesias likely due to TIA from small vessel disease.  Patient is currently on warfarin but plan is to eventually transition to Eliquis once he has been tapered off phenytoin  which has been done by his outpatient neurologist Dr. Margaret.  Continue warfarin in the current dosage for now and maintain aggressive risk factor modification.  Follow-up with Dr. Margaret  as outpatient.  Discussed with Dr. Milda   I personally spent a total of 50 minutes in the care of the patient today including getting/reviewing separately obtained history, performing a medically appropriate exam/evaluation, counseling and educating, placing orders, referring and communicating with other health care professionals, documenting clinical information in the EHR, independently interpreting results, and coordinating care.        Eather Popp, MD Medical Director [facility] Pager: [phone] 09/30/2024 3:54 PM  To contact Stroke Continuity provider, please refer to [email or web address]. After hours, contact General Neurology

## 2024-09-30 NOTE — Assessment & Plan Note (Addendum)
 MRI negative for acute stroke.  CT/CTA head and neck without hemorrhage or large vessel occlusion.  Symptoms are ongoing at this time. -Neurology following, appreciate recs -PT/OT to see for safe discharge planning, the patient has no weakness and is ambulating without difficulty -Continue Crestor  20 mg daily -Continue aspirin 81 mg -Lab holiday

## 2024-09-30 NOTE — ED Notes (Signed)
 CBG reads 123

## 2024-09-30 NOTE — Discharge Summary (Incomplete)
 Family Medicine Teaching [facility] Discharge Summary  Patient name: Jason Salazar Medical record number: [id number] Date of birth: 12-12-1945 Age: 78 y.o. Gender: male Date of Admission: 09/29/2024  Date of Discharge: 10/01/2024 Admitting Physician: Nancyann KATHEE Mori, DO  Primary Care Provider: Vicci Barnie KATHEE, MD Consultants: Neurology   Indication for Hospitalization: TIA  Discharge Diagnoses/Problem List:  Principal Problem:   CVA (cerebral vascular accident) [facility]) Active Problems:   PAF (paroxysmal atrial fibrillation) (HCC)   Diabetes [facility])  Brief Hospital Course:  This is a 78 year old male with PMHx notable for Afib, epilepsy, CHF, T2DM, HLD, HTN, hx of CVA, gout who was admitted to the [facility] family medicine teaching service with right face and upper extremity numbness suspected secondary to TIA.  Numbness, suspected TIA Patient brought in by EMS as code stroke with sudden onset right-sided face and upper extremity numbness, no motor deficits. Neurology consulted. CT/CTA head and neck without hemorrhage or large vessel occlusion.  MRI negative for acute stroke. Did not receive thrombolytics.  Received aspirin 325 mg then home warfarin and aspirin 81 mg continued. Neurology with no additional recommendations. Patient reported some symptomatic improvement and was medically stable for discharge.   PCP recommendations: Patient expressed distrust in the medical field with concerns that recommendation to discontinue statin and start on Repatha is motivated by financial incentives to prescriber. Consider further discussing recommendation and lab support for needing to transition to Repatha. Patient interested in outpatient PT. Consider referral.  Recommend continued workup for alternate etiology of recurrent numbness. Could consider EMG vs cervical spine MRI with reported intermittent, BUE symptoms.  Ensure follow-up with outpatient neurologist    Results/Tests  Pending at Time of Discharge:  None  Disposition: home with [facility]  Discharge Condition: Stable  Discharge Exam:  Vitals:   10/01/24 0405 10/01/24 0807  BP: 107/62 (!) 146/76  Pulse: 95 78  Resp: 18 19  Temp: 98.3 F (36.8 C) 98.6 F (37 C)  SpO2: 100% 98%   General: Well-appearing male sitting in chair CV: RRR Pulm clear to auscultation bilaterally Neuro: No sensory deficits in the face or upper extremities, no gross strength deficits in the bilateral extremities  Significant Procedures: none  Significant Labs and Imaging:  Recent Labs  Lab 09/29/24 2308 09/30/24 0421 10/01/24 0131  WBC 4.6 4.5 5.7  HGB 14.4 14.5 15.3  HCT 45.8 46.8 48.6  PLT 150 130* 154   Recent Labs  Lab 09/29/24 2034 09/29/24 2308  NA 139 137  K 5.3* 4.3  CL 105 106  CO2  --  22  GLUCOSE 175* 130*  BUN 34* 26*  CREATININE 1.70* 1.46*  CALCIUM   --  8.4*  MG  --  2.1  ALKPHOS  --  109  AST  --  25  ALT  --  24  ALBUMIN  --  3.3*    MR BRAIN WO CONTRAST Result Date: 09/29/2024 EXAM: MRI BRAIN WITHOUT CONTRAST 09/29/2024 10:17:29 PM TECHNIQUE: Multiplanar multisequence MRI of the head/brain was performed without the administration of intravenous contrast. COMPARISON: 09/20/2024 CLINICAL HISTORY: Neuro deficit, acute, stroke suspected. FINDINGS: BRAIN AND VENTRICLES: No acute infarct. No intracranial hemorrhage. No mass. No midline shift. No hydrocephalus. Multifocal hyperintense T2-weighted signal within the cerebral white matter, most commonly due to chronic small vessel disease. Mild volume loss. The sella is unremarkable. Normal flow voids. ORBITS: No acute abnormality. SINUSES AND MASTOIDS: No acute abnormality. BONES AND SOFT TISSUES: Normal marrow signal. No acute soft tissue abnormality. IMPRESSION: 1.  No acute intracranial abnormality. 2. Multifocal T2 hyperintense white matter signal compatible with chronic small vessel disease. 3. Mild cerebral volume loss. Electronically signed by:  Franky Stanford MD 09/29/2024 10:33 PM EDT RP Workstation: [id number]   CT HEAD CODE STROKE WO CONTRAST Result Date: 09/29/2024 EXAM: CTA Head and Neck with Intravenous Contrast. CT Head without Contrast. CLINICAL HISTORY: Neuro deficit, acute, stroke suspected. TECHNIQUE: Axial CTA images of the head and neck performed with intravenous contrast. MIP reconstructed images were created and reviewed. Axial computed tomography images of the head/brain performed without intravenous contrast. Note: Per PQRS, the description of internal carotid artery percent stenosis, including 0 percent or normal exam, is based on [place] [employer] Symptomatic Carotid Endarterectomy Trial (NASCET) criteria. Dose reduction technique was used including one or more of the following: automated exposure control, adjustment of mA and kV according to patient size, and/or iterative reconstruction. CONTRAST: With and without; 75 mL (iohexol  (OMNIPAQUE ) 350 MG/ML injection 75 mL IOHEXOL  350 MG/ML SOLN) COMPARISON: 09/20/2024 FINDINGS: CT HEAD: BRAIN: No acute intraparenchymal hemorrhage. No mass lesion. No CT evidence for acute territorial infarct. No midline shift or extra-axial collection. Mild volume loss and chronic mild white matter changes. VENTRICLES: No hydrocephalus. ORBITS: The orbits are unremarkable. SINUSES AND MASTOIDS: The paranasal sinuses and mastoid air cells are clear. CTA NECK: COMMON CAROTID ARTERIES: No significant stenosis. No dissection or occlusion. INTERNAL CAROTID ARTERIES: No stenosis by NASCET criteria. No dissection or occlusion. VERTEBRAL ARTERIES: No significant stenosis. No dissection or occlusion. CTA HEAD: ANTERIOR CEREBRAL ARTERIES: No significant stenosis. No occlusion. No aneurysm. MIDDLE CEREBRAL ARTERIES: No significant stenosis. No occlusion. No aneurysm. POSTERIOR CEREBRAL ARTERIES: No significant stenosis. No occlusion. No aneurysm. BASILAR ARTERY: No significant stenosis. No occlusion. No aneurysm. OTHER:  VASCULATURE: Aspect is 10 aortic atherosclerosis. SOFT TISSUES: No acute finding. No masses or lymphadenopathy. BONES: No acute osseous abnormality. IMPRESSION: 1. No acute intracranial hemorrhage or acute ischemic change. ASPECTS is 10. 2. Mild cerebral volume loss and chronic mild white matter changes. 3. No emergent large vessel occlusion. Findings discussed with Dr. Lonni Tegeler at 8:55 PM on 09/29/2024. Electronically signed by: Franky Stanford MD 09/29/2024 08:56 PM EDT RP Workstation: [id number]   CT ANGIO HEAD NECK W WO CM (CODE STROKE) Result Date: 09/29/2024 EXAM: CTA Head and Neck with Intravenous Contrast. CT Head without Contrast. CLINICAL HISTORY: Neuro deficit, acute, stroke suspected. TECHNIQUE: Axial CTA images of the head and neck performed with intravenous contrast. MIP reconstructed images were created and reviewed. Axial computed tomography images of the head/brain performed without intravenous contrast. Note: Per PQRS, the description of internal carotid artery percent stenosis, including 0 percent or normal exam, is based on [place] Symptomatic Carotid Endarterectomy Trial (NASCET) criteria. Dose reduction technique was used including one or more of the following: automated exposure control, adjustment of mA and kV according to patient size, and/or iterative reconstruction. CONTRAST: With and without; 75 mL (iohexol  (OMNIPAQUE ) 350 MG/ML injection 75 mL IOHEXOL  350 MG/ML SOLN) COMPARISON: 09/20/2024 FINDINGS: CT HEAD: BRAIN: No acute intraparenchymal hemorrhage. No mass lesion. No CT evidence for acute territorial infarct. No midline shift or extra-axial collection. Mild volume loss and chronic mild white matter changes. VENTRICLES: No hydrocephalus. ORBITS: The orbits are unremarkable. SINUSES AND MASTOIDS: The paranasal sinuses and mastoid air cells are clear. CTA NECK: COMMON CAROTID ARTERIES: No significant stenosis. No dissection or occlusion. INTERNAL CAROTID ARTERIES: No  stenosis by NASCET criteria. No dissection or occlusion. VERTEBRAL ARTERIES: No significant stenosis. No dissection or occlusion. CTA  HEAD: ANTERIOR CEREBRAL ARTERIES: No significant stenosis. No occlusion. No aneurysm. MIDDLE CEREBRAL ARTERIES: No significant stenosis. No occlusion. No aneurysm. POSTERIOR CEREBRAL ARTERIES: No significant stenosis. No occlusion. No aneurysm. BASILAR ARTERY: No significant stenosis. No occlusion. No aneurysm. OTHER: VASCULATURE: Aspect is 10 aortic atherosclerosis. SOFT TISSUES: No acute finding. No masses or lymphadenopathy. BONES: No acute osseous abnormality. IMPRESSION: 1. No acute intracranial hemorrhage or acute ischemic change. ASPECTS is 10. 2. Mild cerebral volume loss and chronic mild white matter changes. 3. No emergent large vessel occlusion. Findings discussed with Dr. Lonni Tegeler at 8:55 PM on 09/29/2024. Electronically signed by: Franky Stanford MD 09/29/2024 08:56 PM EDT RP Workstation: [id number]   ECHOCARDIOGRAM COMPLETE Result Date: 09/21/2024    ECHOCARDIOGRAM REPORT   Patient Name:   VIRGLE ARTH Date of Exam: 09/21/2024 Medical Rec #:  [id number]       Height:       63.0 in Accession #:    [id number]      Weight:       181.0 lb Date of Birth:  Aug 30, 1946      BSA:          1.853 m Patient Age:    77 years        BP:           126/60 mmHg Patient Gender: M               HR:           66 bpm. Exam Location:  Inpatient Procedure: 2D Echo, Strain Analysis, Saline Contrast Bubble Study, Cardiac            Doppler and Color Doppler (Both Spectral and Color Flow Doppler were            utilized during procedure). Indications:    TIA  History:        Patient has prior history of Echocardiogram examinations, most                 recent 12/12/2022. CHF, Abnormal ECG, Arrythmias:Atrial                 Fibrillation, AV Block and Atrial Flutter,                 Signs/Symptoms:Syncope and Chest Pain; Risk Factors:Sleep Apnea,                 Dyslipidemia and  Hypertension.  Sonographer:    Ellouise Mose RDCS Referring Phys: [phone] DAVID GIRGUIS  Sonographer Comments: Technically difficult study due to poor echo windows. IMPRESSIONS  1. Left ventricular ejection fraction, by estimation, is 60 to 65%. Left ventricular ejection fraction by 2D MOD biplane is 60.0 %. Left ventricular ejection fraction by PLAX is 63 %. The left ventricle has normal function. The left ventricle has no regional wall motion abnormalities. There is moderate concentric left ventricular hypertrophy. Left ventricular diastolic parameters are indeterminate.  2. Right ventricular systolic function is normal. The right ventricular size is normal. Tricuspid regurgitation signal is inadequate for assessing PA pressure.  3. The mitral valve is grossly normal. Trivial mitral valve regurgitation. No evidence of mitral stenosis.  4. The aortic valve was not well visualized. Aortic valve regurgitation is not visualized. No aortic stenosis is present.  5. The inferior vena cava is normal in size with greater than 50% respiratory variability, suggesting right atrial pressure of 3 mmHg.  6. Agitated saline contrast bubble study was negative, with no evidence of any interatrial  shunt. Comparison(s): No prior Echocardiogram. FINDINGS  Left Ventricle: Left ventricular ejection fraction, by estimation, is 60 to 65%. Left ventricular ejection fraction by PLAX is 63 %. Left ventricular ejection fraction by 2D MOD biplane is 60.0 %. The left ventricle has normal function. The left ventricle has no regional wall motion abnormalities. Global longitudinal strain performed but not reported based on interpreter judgement due to suboptimal tracking. The left ventricular internal cavity size was normal in size. There is moderate concentric left ventricular hypertrophy. Left ventricular diastolic parameters are indeterminate. Right Ventricle: The right ventricular size is normal. No increase in right ventricular wall thickness. Right  ventricular systolic function is normal. Tricuspid regurgitation signal is inadequate for assessing PA pressure. Left Atrium: Left atrial size was normal in size. Right Atrium: Right atrial size was normal in size. Pericardium: There is no evidence of pericardial effusion. Presence of epicardial fat layer. Mitral Valve: The mitral valve is grossly normal. Trivial mitral valve regurgitation. No evidence of mitral valve stenosis. MV peak gradient, 6.2 mmHg. The mean mitral valve gradient is 4.0 mmHg. Tricuspid Valve: The tricuspid valve is normal in structure. Tricuspid valve regurgitation is not demonstrated. No evidence of tricuspid stenosis. Aortic Valve: The aortic valve was not well visualized. Aortic valve regurgitation is not visualized. No aortic stenosis is present. Pulmonic Valve: The pulmonic valve was normal in structure. Pulmonic valve regurgitation is not visualized. No evidence of pulmonic stenosis. Aorta: The aortic root and ascending aorta are structurally normal, with no evidence of dilitation. Venous: The right lower pulmonary vein is normal. The inferior vena cava is normal in size with greater than 50% respiratory variability, suggesting right atrial pressure of 3 mmHg. IAS/Shunts: No atrial level shunt detected by color flow Doppler. Agitated saline contrast was given intravenously to evaluate for intracardiac shunting. Agitated saline contrast bubble study was negative, with no evidence of any interatrial shunt.  LEFT VENTRICLE PLAX 2D                        Biplane EF (MOD) LV EF:         Left            LV Biplane EF:   Left                ventricular                      ventricular                ejection                         ejection                fraction by                      fraction by                PLAX is 63                       2D MOD                %.                               biplane is LVIDd:  3.90 cm                          60.0 %. LVIDs:         2.60 cm LV  PW:         1.70 cm LV IVS:        1.40 cm         2D Longitudinal LVOT diam:     1.90 cm         Strain LV SV:         53              2D Strain GLS   -22.3 % LV SV Index:   29              Avg: LVOT Area:     2.84 cm LV IVRT:       81 msec  LV Volumes (MOD) LV vol d, MOD    87.0 ml A2C: LV vol d, MOD    64.4 ml A4C: LV vol s, MOD    36.1 ml A2C: LV vol s, MOD    23.2 ml A4C: LV SV MOD A2C:   50.9 ml LV SV MOD A4C:   64.4 ml LV SV MOD BP:    48.0 ml RIGHT VENTRICLE             IVC RV S prime:     12.70 cm/s  IVC diam: 1.30 cm TAPSE (M-mode): 1.8 cm LEFT ATRIUM             Index        RIGHT ATRIUM           Index LA diam:        3.50 cm 1.89 cm/m   RA Area:     11.60 cm LA Vol (A2C):   34.9 ml 18.83 ml/m  RA Volume:   20.80 ml  11.22 ml/m LA Vol (A4C):   47.7 ml 25.74 ml/m LA Biplane Vol: 42.3 ml 22.82 ml/m  AORTIC VALVE LVOT Vmax:   96.10 cm/s LVOT Vmean:  60.900 cm/s LVOT VTI:    0.188 m  AORTA Ao Root diam: 3.20 cm Ao Asc diam:  3.30 cm MITRAL VALVE MV Area (PHT): 4.83 cm     SHUNTS MV Area VTI:   2.04 cm     Systemic VTI:  0.19 m MV Peak grad:  6.2 mmHg     Systemic Diam: 1.90 cm MV Mean grad:  4.0 mmHg MV Vmax:       1.24 m/s MV Vmean:      92.0 cm/s MV Decel Time: 157 msec MV E velocity: 120.10 cm/s MV A velocity: 124.00 cm/s MV E/A ratio:  0.97 Darryle Decent MD Electronically signed by Darryle Decent MD Signature Date/Time: 09/21/2024/12:15:22 PM    Final    MR BRAIN WO CONTRAST Result Date: 09/20/2024 EXAM: MR Brain without Intravenous Contrast. CLINICAL HISTORY: transient facial numbness. TECHNIQUE: Magnetic resonance images of the brain without intravenous contrast in multiple planes. CONTRAST: Without. COMPARISON: Same day CTA head and neck. FINDINGS: BRAIN: No restricted diffusion to indicate acute infarction. No intracranial mass or hemorrhage. No midline shift or extra-axial fluid collection. No cerebellar tonsillar ectopia. The central arterial and venous flow voids are patent. T2 FLAIR  hyperintensity in the periventricular and subcortical white matter compatible with mild chronic microvascular ischemic changes. Remote lacunar infarcts in the bilateral basal ganglia and in the left  corona radiata. Remote cortical infarct in the right parieto-occipital lobes. Mild parenchymal volume loss. VENTRICLES: No hydrocephalus. ORBITS: The orbits are normal. SINUSES AND MASTOIDS: The sinuses and mastoid air cells are clear. BONES: No acute fracture or focal osseous lesion. IMPRESSION: 1. No acute findings. 2. Remote lacunar infarcts in the bilateral basal ganglia and in the left corona radiata. 3. Mild chronic microvascular ischemic changes. 4. Remote cortical infarct in the right parieto-occipital lobes. 5. Mild parenchymal volume loss. Electronically signed by: Donnice Mania MD 09/20/2024 06:55 PM EDT RP Workstation: [id number]   CT ANGIO HEAD NECK W WO CM Result Date: 09/20/2024 EXAM: CTA HEAD AND NECK WITH AND WITHOUT 09/20/2024 03:04:00 PM TECHNIQUE: CTA of the head and neck was performed with and without the administration of 75 mL of iohexol  (OMNIPAQUE ) 350 MG/ML injection. Multiplanar 2D and/or 3D reformatted images are provided for review. Automated exposure control, iterative reconstruction, and/or weight based adjustment of the mA/kV was utilized to reduce the radiation dose to as low as reasonably achievable. Stenosis of the internal carotid arteries measured using NASCET criteria. COMPARISON: CTA head and neck 03/08/2014 and MRI head 02/19/2014 CLINICAL HISTORY: R sided facial numbness. 75ml Omni350; Pt BIB [facility] from home, family reported 5 mins face went numb, possibly had trouble smiling per family at about 1230. Symptoms resolved upon fire arrival, pt denies facial numbness currently but states he feels a warmness on the top of his head that has been ongoing for awhile. Hx of stroke. FINDINGS: CTA NECK: AORTIC ARCH AND ARCH VESSELS: Atherosclerosis of the visualized aortic arch. No  dissection or arterial injury. No significant stenosis of the brachiocephalic or subclavian arteries. CERVICAL CAROTID ARTERIES: No dissection, arterial injury, or hemodynamically significant stenosis by NASCET criteria. CERVICAL VERTEBRAL ARTERIES: There is limited evaluation of the vertebral arteries due to vessel caliber and contrast timing as well as streak artifact. Within these limitations, the visualized intracranial vertebral artery is patent. No dissection, arterial injury, or significant stenosis. LUNGS AND MEDIASTINUM: Unremarkable. SOFT TISSUES: No acute abnormality. BONES: Degenerative changes in the visualized spine. CTA HEAD: ANTERIOR CIRCULATION: The intracranial internal carotid arteries are patent bilaterally. Atherosclerosis of the carotid siphons which contributes to mild stenosis of the bilateral cavernous ICAs. The anterior cerebral arteries are patent proximally. Limited evaluation of distal ACA branches due to contrast bolus timing and artifact. The middle cerebral arteries are patent proximally. There is limited evaluation of distal MCA branches due to contrast bolus timing and artifact. No aneurysm. POSTERIOR CIRCULATION: No significant stenosis of the posterior cerebral arteries. No significant stenosis of the basilar artery. No significant stenosis of the vertebral arteries. No aneurysm. OTHER: Generalized parenchymal volume loss. Nonspecific hypoattenuation in the periventricular and subcortical white matter, most likely representing chronic microvascular ischemic changes. Remote infarcts in the left corona radiata and left basal ganglia. Possible remote infarct in the right parietal lobe. No dural venous sinus thrombosis on this non-dedicated study. IMPRESSION: 1. Slightly limited evaluation as above, due to contrast bolus timing and streak artifact. Within these limitations, no large vessel occlusion or hemodynamically significant stenosis in the head or neck. 2. Atherosclerosis of the  carotid siphons contributing to mild stenosis of the bilateral cavernous ICAs. 3. Generalized parenchymal volume loss and chronic microvascular ischemic changes. 4. Remote infarcts in the left corona radiata and left basal ganglia, with a possible remote infarct in the right parietal lobe. Electronically signed by: Donnice Mania MD 09/20/2024 03:58 PM EDT RP Workstation: [id number]     Discharge Medications:  Allergies  as of 10/01/2024       Reactions   Atorvastatin Other (See Comments)   Light-headedness        Medication List     PAUSE taking these medications    allopurinol  100 MG tablet Wait to take this until your doctor or other care provider tells you to start again. Commonly known as: ZYLOPRIM  Take 100 mg by mouth 2 (two) times daily as needed (for gout flares).   insulin  degludec 100 UNIT/ML FlexTouch Pen Wait to take this until your doctor or other care provider tells you to start again. Commonly known as: TRESIBA  Inject 26 Units into the skin at bedtime.   Repatha SureClick 140 MG/ML Soaj Wait to take this until your doctor or other care provider tells you to start again. Generic drug: Evolocumab Inject 140 mg into the skin every 14 (fourteen) days.       STOP taking these medications    Colchicine  0.6 MG Caps   ibuprofen 800 MG tablet Commonly known as: ADVIL   mometasone 50 MCG/ACT nasal spray Commonly known as: NASONEX   tiZANidine 4 MG tablet Commonly known as: ZANAFLEX       TAKE these medications    aspirin EC 81 MG tablet Take 81 mg by mouth in the morning.   b complex vitamins tablet Take 1 tablet by mouth daily.   Blood Pressure Cuff Misc Use to check blood pressure daily.   Dilantin  100 MG ER capsule Generic drug: phenytoin  Take 3 capsules (300 mg total) by mouth at bedtime.   Farxiga  10 MG Tabs tablet Generic drug: dapagliflozin  propanediol TAKE 1 TABLET BY MOUTH EVERY DAY   FreeStyle Libre 3 Plus Sensor Misc Change sensor  every 15 days.   furosemide  20 MG tablet Commonly known as: LASIX  Take 1 tablet (20 mg total) by mouth daily.   lacosamide  50 MG Tabs tablet Commonly known as: Vimpat  Take 1 tablet (50 mg total) by mouth 2 (two) times daily. What changed:  when to take this additional instructions   lisinopril -hydrochlorothiazide  20-12.5 MG tablet Commonly known as: ZESTORETIC  Take 1 tablet by mouth daily.   Lumigan  0.01 % Soln Generic drug: bimatoprost  Instill 1 drop into both eyes at bedtime   ONE-A-DAY MENS 50+ ADVANTAGE PO Take 1 tablet by mouth daily with breakfast.   PREVAGEN PO Take 1 tablet by mouth See admin instructions. Prevagen chewable tablets - Chew 1 tablet by mouth once a day   rosuvastatin  20 MG tablet Commonly known as: CRESTOR  Take 20 mg by mouth at bedtime.   Vitamin C 250 MG Tabs Take 250 mg by mouth daily.   Vitamin D3 1000 units Caps Take 1,000 Units by mouth daily.   warfarin 5 MG tablet Commonly known as: COUMADIN  Take as directed. If you are unsure how to take this medication, talk to your nurse or doctor. Original instructions: Take 1 tablet (5 mg total) by mouth daily. What changed:  how much to take when to take this additional instructions               Durable Medical Equipment  (From admission, onward)           Start     Ordered   09/30/24 1617  For home use only DME Walker rolling  Once       Question Answer Comment  Walker: With 5 Inch Wheels   Patient needs a walker to treat with the following condition TIA (transient ischemic attack)  09/30/24 1624            Discharge Instructions: Please refer to Patient Instructions section of EMR for full details.  Patient was counseled important signs and symptoms that should prompt return to medical care, changes in medications, dietary instructions, activity restrictions, and follow up appointments.   Follow-Up Appointments:  Follow-up Information     Vicci Barnie NOVAK,  MD. Schedule an appointment as soon as possible for a visit in 1 week(s).   Specialty: Internal Medicine Contact information: [address] [place] [address] [place] [address] [phone]         [facility]) Follow up.   Specialty: Home Health Services Why: Physical Therapy-office to call with visit times. Contact information: [address] [place]  [phone]        [facility] (DME) Follow up.   Specialty: DME Services Why: Rolling Walker to be delivered to the room Contact information: [address] [place] [address] [place]  [id number] [phone]                Alena Morrison, Elio, MD 10/01/2024, 12:08 PM PGY-1, [facility]

## 2024-09-30 NOTE — Assessment & Plan Note (Addendum)
 Glucose 130-173, no insulin  administered.  Recent A1c 7.5 - Continue CBG monitoring ACHS and moderate SSI - Continue Farxiga  10 mg

## 2024-09-30 NOTE — ED Notes (Signed)
 CBG 203

## 2024-09-30 NOTE — Progress Notes (Signed)
 Daily Progress Note Intern Pager: [phone]  Patient name: Jason Salazar Medical record number: [id number] Date of birth: 09-04-1946 Age: 78 y.o. Gender: male  Primary Care Provider: Vicci Barnie NOVAK, MD Consultants: Neurology Code Status: Full, no blood products  Pt Overview and Major Events to Date:  10/22-admitted  Assessment and Plan:  This is a 78 year old male with diabetes, A-fib, history of CVA, OSA, hypertension, HLD, history of prostate cancer, hep C, CKD, T2DM, CHF who was admitted for right face and upper extremity numbness, MRI negative for CVA, suspect recurrent TIA. Assessment & Plan CVA (cerebral vascular accident) (HCC) MRI negative for acute stroke.  CT/CTA head and neck without hemorrhage or large vessel occlusion.  Symptoms are ongoing at this time. -Neurology following, appreciate recs -PT/OT to see for safe discharge planning, the patient has no weakness and is ambulating without difficulty -Continue Crestor  20 mg daily -Continue aspirin 81 mg -Lab holiday PAF (paroxysmal atrial fibrillation) (HCC) Telemetry reviewed and showed Mobitz type I AV block, even when telemetry reported A-fib -Initial neurology note with recommendations to hold warfarin, but this was continued, reach out to neurology about recommendation with no response yet, will continue warfarin at this time pending neurology attending attestation -Continue telemetry Diabetes (HCC) Glucose 130-173, no insulin  administered.  Recent A1c 7.5 - Continue CBG monitoring ACHS and moderate SSI - Continue Farxiga  10 mg Chronic health problem Gout - Continue home Allopurinol  100mg  BID PRN Seizure history - Continue home Vimpat  50mg  BID and Dilantin  ER 300mg  at bedtime. CHF - Continue home Lasix  20mg  daily HLD - Continue home Crestor  20mg  daily. Has been approved for Repatha but not yet started this. Muscle cramps - Continue home Tizanidine 4mg  PRN BID HTN - restart  Lisinopril -hydrochlorothiazide  20-12.5mg  given no CVA  FEN/GI: carb modified PPx: warfarin Dispo:Home pending clinical improvement .   Subjective:  Patient with persistent right face and right upper extremity numbness.  No burning pain or tingling.  No weakness.  No recent illnesses.  Objective: Temp:  [98.1 F (36.7 C)-98.4 F (36.9 C)] 98.1 F (36.7 C) (10/23 0430) Pulse Rate:  [31-144] 56 (10/23 0530) Resp:  [14-23] 17 (10/23 0530) BP: (112-150)/(49-101) 112/60 (10/23 0530) SpO2:  [97 %-100 %] 99 % (10/23 0530) Weight:  [82 kg] 82 kg (10/22 2000) Physical Exam: General: Well-appearing male lying in bed CV: Regular rate, occasional missed beat, 2+ radial pulse Pulm: CTAB, no increased work of breathing  Laboratory: Most recent CBC Lab Results  Component Value Date   WBC 4.5 09/30/2024   HGB 14.5 09/30/2024   HCT 46.8 09/30/2024   MCV 90.7 09/30/2024   PLT 130 (L) 09/30/2024   Most recent BMP    Latest Ref Rng & Units 09/29/2024   11:08 PM  BMP  Glucose 70 - 99 mg/dL 869   BUN 8 - 23 mg/dL 26   Creatinine 9.38 - 1.24 mg/dL 8.53   Sodium 864 - 854 mmol/L 137   Potassium 3.5 - 5.1 mmol/L 4.3   Chloride 98 - 111 mmol/L 106   CO2 22 - 32 mmol/L 22   Calcium  8.9 - 10.3 mg/dL 8.4     TSH 7.57  Imaging/Diagnostic Tests: MR BRAIN WO CONTRAST Result Date: 09/29/2024 EXAM: MRI BRAIN WITHOUT CONTRAST 09/29/2024 10:17:29 PM TECHNIQUE: Multiplanar multisequence MRI of the head/brain was performed without the administration of intravenous contrast. COMPARISON: 09/20/2024 CLINICAL HISTORY: Neuro deficit, acute, stroke suspected. FINDINGS: BRAIN AND VENTRICLES: No acute infarct. No intracranial hemorrhage. No mass. No  midline shift. No hydrocephalus. Multifocal hyperintense T2-weighted signal within the cerebral white matter, most commonly due to chronic small vessel disease. Mild volume loss. The sella is unremarkable. Normal flow voids. ORBITS: No acute abnormality. SINUSES  AND MASTOIDS: No acute abnormality. BONES AND SOFT TISSUES: Normal marrow signal. No acute soft tissue abnormality. IMPRESSION: 1. No acute intracranial abnormality. 2. Multifocal T2 hyperintense white matter signal compatible with chronic small vessel disease. 3. Mild cerebral volume loss. Electronically signed by: Franky Stanford MD 09/29/2024 10:33 PM EDT RP Workstation: [id number]   CT HEAD CODE STROKE WO CONTRAST Result Date: 09/29/2024 EXAM: CTA Head and Neck with Intravenous Contrast. CT Head without Contrast. CLINICAL HISTORY: Neuro deficit, acute, stroke suspected. TECHNIQUE: Axial CTA images of the head and neck performed with intravenous contrast. MIP reconstructed images were created and reviewed. Axial computed tomography images of the head/brain performed without intravenous contrast. Note: Per PQRS, the description of internal carotid artery percent stenosis, including 0 percent or normal exam, is based on [place] Symptomatic Carotid Endarterectomy Trial (NASCET) criteria. Dose reduction technique was used including one or more of the following: automated exposure control, adjustment of mA and kV according to patient size, and/or iterative reconstruction. CONTRAST: With and without; 75 mL (iohexol  (OMNIPAQUE ) 350 MG/ML injection 75 mL IOHEXOL  350 MG/ML SOLN) COMPARISON: 09/20/2024 FINDINGS: CT HEAD: BRAIN: No acute intraparenchymal hemorrhage. No mass lesion. No CT evidence for acute territorial infarct. No midline shift or extra-axial collection. Mild volume loss and chronic mild white matter changes. VENTRICLES: No hydrocephalus. ORBITS: The orbits are unremarkable. SINUSES AND MASTOIDS: The paranasal sinuses and mastoid air cells are clear. CTA NECK: COMMON CAROTID ARTERIES: No significant stenosis. No dissection or occlusion. INTERNAL CAROTID ARTERIES: No stenosis by NASCET criteria. No dissection or occlusion. VERTEBRAL ARTERIES: No significant stenosis. No dissection or occlusion. CTA  HEAD: ANTERIOR CEREBRAL ARTERIES: No significant stenosis. No occlusion. No aneurysm. MIDDLE CEREBRAL ARTERIES: No significant stenosis. No occlusion. No aneurysm. POSTERIOR CEREBRAL ARTERIES: No significant stenosis. No occlusion. No aneurysm. BASILAR ARTERY: No significant stenosis. No occlusion. No aneurysm. OTHER: VASCULATURE: Aspect is 10 aortic atherosclerosis. SOFT TISSUES: No acute finding. No masses or lymphadenopathy. BONES: No acute osseous abnormality. IMPRESSION: 1. No acute intracranial hemorrhage or acute ischemic change. ASPECTS is 10. 2. Mild cerebral volume loss and chronic mild white matter changes. 3. No emergent large vessel occlusion. Findings discussed with Dr. Lonni Tegeler at 8:55 PM on 09/29/2024. Electronically signed by: Franky Stanford MD 09/29/2024 08:56 PM EDT RP Workstation: [id number]   CT ANGIO HEAD NECK W WO CM (CODE STROKE) Result Date: 09/29/2024 EXAM: CTA Head and Neck with Intravenous Contrast. CT Head without Contrast. CLINICAL HISTORY: Neuro deficit, acute, stroke suspected. TECHNIQUE: Axial CTA images of the head and neck performed with intravenous contrast. MIP reconstructed images were created and reviewed. Axial computed tomography images of the head/brain performed without intravenous contrast. Note: Per PQRS, the description of internal carotid artery percent stenosis, including 0 percent or normal exam, is based on [place] Symptomatic Carotid Endarterectomy Trial (NASCET) criteria. Dose reduction technique was used including one or more of the following: automated exposure control, adjustment of mA and kV according to patient size, and/or iterative reconstruction. CONTRAST: With and without; 75 mL (iohexol  (OMNIPAQUE ) 350 MG/ML injection 75 mL IOHEXOL  350 MG/ML SOLN) COMPARISON: 09/20/2024 FINDINGS: CT HEAD: BRAIN: No acute intraparenchymal hemorrhage. No mass lesion. No CT evidence for acute territorial infarct. No midline shift or extra-axial  collection. Mild volume loss and chronic mild white matter  changes. VENTRICLES: No hydrocephalus. ORBITS: The orbits are unremarkable. SINUSES AND MASTOIDS: The paranasal sinuses and mastoid air cells are clear. CTA NECK: COMMON CAROTID ARTERIES: No significant stenosis. No dissection or occlusion. INTERNAL CAROTID ARTERIES: No stenosis by NASCET criteria. No dissection or occlusion. VERTEBRAL ARTERIES: No significant stenosis. No dissection or occlusion. CTA HEAD: ANTERIOR CEREBRAL ARTERIES: No significant stenosis. No occlusion. No aneurysm. MIDDLE CEREBRAL ARTERIES: No significant stenosis. No occlusion. No aneurysm. POSTERIOR CEREBRAL ARTERIES: No significant stenosis. No occlusion. No aneurysm. BASILAR ARTERY: No significant stenosis. No occlusion. No aneurysm. OTHER: VASCULATURE: Aspect is 10 aortic atherosclerosis. SOFT TISSUES: No acute finding. No masses or lymphadenopathy. BONES: No acute osseous abnormality. IMPRESSION: 1. No acute intracranial hemorrhage or acute ischemic change. ASPECTS is 10. 2. Mild cerebral volume loss and chronic mild white matter changes. 3. No emergent large vessel occlusion. Findings discussed with Dr. Lonni Tegeler at 8:55 PM on 09/29/2024. Electronically signed by: Franky Stanford MD 09/29/2024 08:56 PM EDT RP Workstation: [id number]    Alena Morrison, Elio, MD 09/30/2024, 7:12 AM  PGY-1, [facility] FPTS Intern pager: [phone], text pages welcome Secure chat group [facility] Teaching Service

## 2024-09-30 NOTE — Discharge Instructions (Addendum)
 Dear Jason Salazar,   Thank you for letting us  participate in your care! In this section, you will find a brief hospital admission summary of why you were admitted to the hospital, what happened during your admission, your diagnosis/diagnoses, and recommended follow up.  Primary diagnosis: Transient ischemic attack Treatment plan: Continue on 81 mg of aspirin and warfarin.  Follow-up with your outpatient neurologist.   POST-HOSPITAL & CARE INSTRUCTIONS We recommend following up with your PCP within 1 week from being discharged from the hospital. Please let PCP/Specialists know of any changes in medications that were made which you will be able to see in the medications section of this packet. Please also follow up with your outpatient neurologist for further evaluation and treatment.  DOCTOR'S APPOINTMENTS & FOLLOW UP Future Appointments  Date Time Provider Department Center  10/01/2024  7:00 AM [facility] HVT DEVICE REMOTES [facility]  10/04/2024  4:00 PM Penumalli, Vikram R, MD [facility] None  10/07/2024  9:15 AM [facility] HVT COUMADIN  CLINIC 2 [facility]  10/12/2024 10:00 AM Neysa Reggy BIRCH, MD [facility] [address]  11/01/2024  7:05 AM CVD HVT DEVICE REMOTES [facility]  11/19/2024  8:50 AM Vicci Barnie NOVAK, MD [facility] [address]  11/23/2024  8:40 AM Santo Stanly LABOR, MD [facility]  12/02/2024  7:10 AM CVD HVT DEVICE REMOTES [facility]  01/02/2025  7:00 AM CVD HVT DEVICE REMOTES [facility]  01/27/2025  9:30 AM Nida, Ethelle ORN, MD [facility] None  02/02/2025  7:00 AM CVD HVT DEVICE REMOTES [facility]  05/31/2025  9:50 AM [facility] ANNUAL WELLNESS VISIT [facility] [address]  09/05/2025  2:30 PM Lomax, Amy, NP [facility] None     Thank you for choosing [facility]! Take care and be well!  Family Medicine Teaching Service Inpatient Team [place]  [facility]  [address] [place], [place] [address] [phone]

## 2024-09-30 NOTE — Evaluation (Signed)
 Physical Therapy Evaluation Patient Details Name: Jason Salazar MRN: [id number] DOB: 1946/11/16 Today's Date: 09/30/2024  History of Present Illness  Pt is 78 yo presenting [facility] on 10/22 due to R facial numbness. PMH: prostate cancer, epilepsy, CKD, a-fib, DM, CVA, recent TIA, CHF, HTN and sleep apnea  Clinical Impression  Pt is currently presenting at Mod I for bed mobility, CGA for sit to stand and gait without an AD. Pt with high risk for falls utilizing wide BOS and drifting R/L with intermittent UE support on whatever is available for balance. Family can assist at home. Due to pt current functional status, home set up and available assistance at home recommending skilled physical therapy services 3x/week in order to address strength, balance and functional mobility to decrease risk for falls, injury and re-hospitalization.           If plan is discharge home, recommend the following: Help with stairs or ramp for entrance;Assistance with cooking/housework;Assist for transportation     Equipment Recommendations Rolling walker (2 wheels)     Functional Status Assessment Patient has had a recent decline in their functional status and demonstrates the ability to make significant improvements in function in a reasonable and predictable amount of time.     Precautions / Restrictions Precautions Precautions: Fall Recall of Precautions/Restrictions: Impaired Restrictions Weight Bearing Restrictions Per Provider Order: No      Mobility  Bed Mobility Overal bed mobility: Modified Independent     General bed mobility comments: slight increase in time to get to supine. Pt sitting up in recliner on arrival    Transfers Overall transfer level: Needs assistance Equipment used: None Transfers: Sit to/from Stand, Bed to chair/wheelchair/BSC Sit to Stand: Contact guard assist           General transfer comment: very wide BOS, CGA for stability, no overt LOB     Ambulation/Gait Ambulation/Gait assistance: Contact guard assist Gait Distance (Feet): 150 Feet Assistive device: None Gait Pattern/deviations: Wide base of support, Step-through pattern, Decreased stride length, Drifts right/left Gait velocity: decreased Gait velocity interpretation: <1.8 ft/sec, indicate of risk for recurrent falls   General Gait Details: Pt with uneven step length, drifting R/L, no overt LOB, occasionally using UE to stabilize. Wide BOS  Stairs Stairs:  (demonstrates ability with sit to stand and gait to navigate one step as pt described at home with a little assistance)          Modified Rankin (Stroke Patients Only) Modified Rankin (Stroke Patients Only) Pre-Morbid Rankin Score: No symptoms Modified Rankin: Slight disability     Balance Overall balance assessment: Needs assistance Sitting-balance support: No upper extremity supported, Feet supported Sitting balance-Leahy Scale: Good     Standing balance support: No upper extremity supported, Single extremity supported, During functional activity Standing balance-Leahy Scale: Poor Standing balance comment: pt driving R/L and requiring intermittent UE Support on walls         Pertinent Vitals/Pain Pain Assessment Pain Assessment: 0-10 Pain Score: 5  Pain Location: low back Pain Descriptors / Indicators: Aching Pain Intervention(s): Monitored during session, Patient requesting pain meds-RN notified, Limited activity within patient's tolerance    Home Living Family/patient expects to be discharged to:: Private residence Living Arrangements: Spouse/significant other;Children Available Help at Discharge: Available 24 hours/day;Family Type of Home: House Home Access: Stairs to enter Entrance Stairs-Rails: None Entrance Stairs-Number of Steps: 1   Home Layout: One level Home Equipment: Grab bars - tub/shower;Standard Environmental consultant      Prior Function Prior Level  of Function : Independent/Modified  Independent             Mobility Comments: Pt reports ind with mobility without an AD. Denies falls. ADLs Comments: Reports family helps with transportation. Ind with ADLs/IADLs     Extremity/Trunk Assessment   Upper Extremity Assessment Upper Extremity Assessment: Generalized weakness    Lower Extremity Assessment Lower Extremity Assessment: Generalized weakness    Cervical / Trunk Assessment Cervical / Trunk Assessment: Normal  Communication   Communication Communication: No apparent difficulties    Cognition Arousal: Alert Behavior During Therapy: WFL for tasks assessed/performed   PT - Cognitive impairments: Safety/Judgement   PT - Cognition Comments: pt slightly impulsive and not very safe. Following commands: Intact       Cueing Cueing Techniques: Verbal cues     General Comments General comments (skin integrity, edema, etc.): no signs/symptoms of cardiac/respiratory distress during session        Assessment/Plan    PT Assessment Patient needs continued PT services  PT Problem List Decreased strength;Decreased balance;Decreased mobility;Decreased safety awareness       PT Treatment Interventions DME instruction;Functional mobility training;Gait training;Stair training;Therapeutic exercise;Therapeutic activities;Neuromuscular re-education;Balance training;Patient/family education    PT Goals (Current goals can be found in the Care Plan section)  Acute Rehab PT Goals Patient Stated Goal: to go home PT Goal Formulation: With patient Time For Goal Achievement: 10/14/24 Potential to Achieve Goals: Good    Frequency Min 2X/week        AM-PAC PT 6 Clicks Mobility  Outcome Measure Help needed turning from your back to your side while in a flat bed without using bedrails?: None Help needed moving from lying on your back to sitting on the side of a flat bed without using bedrails?: None Help needed moving to and from a bed to a chair (including a  wheelchair)?: A Little Help needed standing up from a chair using your arms (e.g., wheelchair or bedside chair)?: A Little Help needed to walk in hospital room?: A Little Help needed climbing 3-5 steps with a railing? : A Little 6 Click Score: 20    End of Session Equipment Utilized During Treatment: Gait belt Activity Tolerance: Patient tolerated treatment well Patient left: in bed;with call bell/phone within reach Nurse Communication: Mobility status PT Visit Diagnosis: Unsteadiness on feet (R26.81);Muscle weakness (generalized) (M62.81)    Time: 8681-8656 PT Time Calculation (min) (ACUTE ONLY): 25 min   Charges:   PT Evaluation $PT Eval Low Complexity: 1 Low PT Treatments $Therapeutic Activity: 8-22 mins PT General Charges $$ ACUTE PT VISIT: 1 Visit        Dorothyann Maier, DPT, CLT  Acute Rehabilitation Services Office: [phone] (Secure chat preferred)   Dorothyann VEAR Maier 09/30/2024, 3:38 PM

## 2024-10-01 ENCOUNTER — Ambulatory Visit: Payer: Self-pay

## 2024-10-01 DIAGNOSIS — G459 Transient cerebral ischemic attack, unspecified: Secondary | ICD-10-CM | POA: Diagnosis not present

## 2024-10-01 DIAGNOSIS — I480 Paroxysmal atrial fibrillation: Secondary | ICD-10-CM

## 2024-10-01 LAB — PROTIME-INR
INR: 1.7 — ABNORMAL HIGH (ref 0.8–1.2)
Prothrombin Time: 21.3 s — ABNORMAL HIGH (ref 11.4–15.2)

## 2024-10-01 LAB — GLUCOSE, CAPILLARY
Glucose-Capillary: 203 mg/dL — ABNORMAL HIGH (ref 70–99)
Glucose-Capillary: 123 mg/dL — ABNORMAL HIGH (ref 70–99)
Glucose-Capillary: 145 mg/dL — ABNORMAL HIGH (ref 70–99)

## 2024-10-01 LAB — CBC
HCT: 48.6 % (ref 39.0–52.0)
Hemoglobin: 15.3 g/dL (ref 13.0–17.0)
MCH: 28.1 pg (ref 26.0–34.0)
MCHC: 31.5 g/dL (ref 30.0–36.0)
MCV: 89.2 fL (ref 80.0–100.0)
Platelets: 154 K/uL (ref 150–400)
RBC: 5.45 MIL/uL (ref 4.22–5.81)
RDW: 14 % (ref 11.5–15.5)
WBC: 5.7 K/uL (ref 4.0–10.5)
nRBC: 0 % (ref 0.0–0.2)

## 2024-10-01 MED ORDER — STROKE: EARLY STAGES OF RECOVERY BOOK
Freq: Once | Status: AC
  Filled 2024-10-01: qty 1

## 2024-10-01 NOTE — Progress Notes (Signed)
 Patient wheeled off unit by this RN DME walker with us . IV removed.

## 2024-10-01 NOTE — TOC CAGE-AID Note (Signed)
 Transition of Care [facility]) - CAGE-AID Screening   Patient Details  Name: Jason Salazar MRN: [id number] Date of Birth: Jan 07, 1946  Transition of Care [facility]) CM/SW Contact:    Jisell Majer E Albirtha Grinage, LCSW Phone Number: 10/01/2024, 9:04 AM   Clinical Narrative:    CAGE-AID Screening:    Have You Ever Felt You Ought to Cut Down on Your Drinking or Drug Use?: No Have People Annoyed You By [employer] Your Drinking Or Drug Use?: No Have You Felt Bad Or Guilty About Your Drinking Or Drug Use?: No Have You Ever Had a Drink or Used Drugs First Thing In The Morning to Steady Your Nerves or to Get Rid of a Hangover?: No CAGE-AID Score: 0  Substance Abuse Education Offered: No

## 2024-10-01 NOTE — Assessment & Plan Note (Signed)
 Last A1c 7.5 received 2 units aspart last 24 hours. - Continue CBG monitoring ACHS and moderate SSI - Continue Farxiga  10 mg

## 2024-10-01 NOTE — Plan of Care (Signed)
   Problem: Education: Goal: Ability to describe self-care measures that may prevent or decrease complications (Diabetes Survival Skills Education) will improve Outcome: Progressing   Problem: Coping: Goal: Ability to adjust to condition or change in health will improve Outcome: Progressing   Problem: Fluid Volume: Goal: Ability to maintain a balanced intake and output will improve Outcome: Progressing   Problem: Skin Integrity: Goal: Risk for impaired skin integrity will decrease Outcome: Progressing

## 2024-10-01 NOTE — Assessment & Plan Note (Signed)
 Gout - Continue home Allopurinol  100mg  BID PRN Seizure history - Continue home Vimpat  50mg  BID and Dilantin  ER 300mg  at bedtime. CHF - Continue home Lasix  20mg  daily HLD - Continue home Crestor  20mg  daily. Has been approved for Repatha but not yet started this. Muscle cramps - Continue home Tizanidine 4mg  PRN BID HTN -continue home lisinopril -hydrochlorothiazide  20-12.5mg 

## 2024-10-01 NOTE — Evaluation (Signed)
 Occupational Therapy Evaluation Patient Details Name: Jason Salazar MRN: [id number] DOB: [birth year] Today's Date: 10/01/2024   History of Present Illness   Pt is 78 yo presenting to [facility] on 10/22 due to R facial numbness. PMH: prostate cancer, epilepsy, CKD, a-fib, DM, CVA, recent TIA, CHF, HTN and sleep apnea     Clinical Impressions Pt greeted seated EOB, agreeable for OT evaluation. Admitted for the above details. PTA, he was independent with ADLs, IADLs, living with his wife. Reports he uses public transportation or his son drives him to appointments. His wife assists with financial mgmt and organizing his rides to appointments; pt wishes to be more independent with this. Functionally, he presents with mild RUE ataxia and incoordination, although BUE strength grossly symmetrical ~4+/5. Functionally, he needed no more than CGA for functional transfers + ambulation via RW. Anticipated up to min A for UB ADLs (ie grooming, container/fastener mgmt, etc.) given coordination deficits in RUE. Education provided as detailed re: safety, falls prevention, safe home setup, benefits of tub transfer bench.  Pt is currently functioning below baseline and would benefit from ongoing acute OT services to progress towards safe discharge and to facilitate return to prior level of function. Current recommendation is home with home health OT.     If plan is discharge home, recommend the following:   Assistance with cooking/housework;Direct supervision/assist for medications management;Direct supervision/assist for financial management;Assist for transportation     Functional Status Assessment   Patient has had a recent decline in their functional status and demonstrates the ability to make significant improvements in function in a reasonable and predictable amount of time.     Equipment Recommendations   Tub/shower bench     Recommendations for Other Services          Precautions/Restrictions   Precautions Precautions: Fall Recall of Precautions/Restrictions: Intact Restrictions Weight Bearing Restrictions Per Provider Order: No     Mobility Bed Mobility               General bed mobility comments: not assessed - pt EOB upon arrival    Transfers Overall transfer level: Needs assistance Equipment used: Rolling walker (2 wheels) Transfers: Sit to/from Stand, Bed to chair/wheelchair/BSC Sit to Stand: Supervision, Contact guard assist     Step pivot transfers: Supervision, Contact guard assist     General transfer comment: Ambulated community & household distances with RW without overt LOB      Balance Overall balance assessment: Mild deficits observed, not formally tested                                         ADL either performed or assessed with clinical judgement   ADL Overall ADL's : Needs assistance/impaired Eating/Feeding: Independent   Grooming: Supervision/safety;Wash/dry hands;Standing               Lower Body Dressing: Contact guard assist;Sitting/lateral leans   Toilet Transfer: Contact guard assist;Ambulation;Regular Toilet;Rolling walker (2 wheels)   Toileting- Clothing Manipulation and Hygiene: Contact guard assist;Sitting/lateral lean;Sit to/from stand       Functional mobility during ADLs: Contact guard assist;Supervision/safety;Rolling walker (2 wheels)       Vision Baseline Vision/History: 1 Wears glasses Ability to See in Adequate Light: 0 Adequate Patient Visual Report: No change from baseline Vision Assessment?: Wears glasses for reading;No apparent visual deficits     Perception  Praxis         Pertinent Vitals/Pain Pain Assessment Pain Assessment: No/denies pain Pain Score: 0-No pain     Extremity/Trunk Assessment Upper Extremity Assessment Upper Extremity Assessment: Right hand dominant;RUE deficits/detail RUE Deficits / Details: RUE grossly  4+/5, ataxic and some coordination deficits (RAM and FTN testing) RUE Sensation: decreased light touch RUE Coordination: decreased fine motor       Cervical / Trunk Assessment Cervical / Trunk Assessment: Normal   Communication Communication Communication: No apparent difficulties   Cognition Arousal: Alert Behavior During Therapy: WFL for tasks assessed/performed               OT - Cognition Comments: forgetful                 Following commands: Intact       Cueing  General Comments   Cueing Techniques: Verbal cues  no s/s distress during session, overall tolerating well   Exercises     Shoulder Instructions      Home Living Family/patient expects to be discharged to:: Private residence Living Arrangements: Spouse/significant other;Children Available Help at Discharge: Available 24 hours/day;Family Type of Home: House Home Access: Stairs to enter Secretary/administrator of Steps: 1 Entrance Stairs-Rails: None Home Layout: One level     Bathroom Shower/Tub: Chief Strategy Officer: Handicapped height     Home Equipment: Grab bars - tub/shower;Standard Environmental consultant          Prior Functioning/Environment Prior Level of Function : Independent/Modified Independent;Driving             Mobility Comments: Pt reports ind with mobility without an AD. Denies falls. ADLs Comments: indep with ADLs, IADLs, enjoys keeping his house clean    OT Problem List: Decreased activity tolerance;Impaired balance (sitting and/or standing);Decreased cognition;Decreased coordination   OT Treatment/Interventions: Self-care/ADL training;Therapeutic exercise;Therapeutic activities;Cognitive remediation/compensation;Balance training;Patient/family education      OT Goals(Current goals can be found in the care plan section)   Acute Rehab OT Goals Patient Stated Goal: to be able to manage his appointments independently and not have to rely on his wife so  much OT Goal Formulation: With patient Time For Goal Achievement: 10/15/24 Potential to Achieve Goals: Good   OT Frequency:  Min 2X/week    Co-evaluation              AM-PAC OT 6 Clicks Daily Activity     Outcome Measure Help from another person eating meals?: None Help from another person taking care of personal grooming?: A Little Help from another person toileting, which includes using toliet, bedpan, or urinal?: A Little Help from another person bathing (including washing, rinsing, drying)?: A Little Help from another person to put on and taking off regular upper body clothing?: A Little Help from another person to put on and taking off regular lower body clothing?: A Little 6 Click Score: 19   End of Session Equipment Utilized During Treatment: Rolling walker (2 wheels) Nurse Communication: Mobility status  Activity Tolerance: Patient tolerated treatment well Patient left: in chair;with call bell/phone within reach  OT Visit Diagnosis: Unsteadiness on feet (R26.81)                Time: 9244-9167 OT Time Calculation (min): 37 min Charges:  OT General Charges $OT Visit: 1 Visit OT Evaluation $OT Eval Low Complexity: 1 Low OT Treatments $Self Care/Home Management : 8-22 mins  Tammey Deeg D., MSOT, OTR/L Acute Rehabilitation Services [phone] Secure Chat Preferred  Kanetra Ho  Garnet Chatmon 10/01/2024, 10:04 AM

## 2024-10-01 NOTE — Progress Notes (Signed)
 Reviewed AVS with patient, answered all questions. I discussed heart failure and stroke education with pt, RN at bedside to give him the books.

## 2024-10-01 NOTE — Assessment & Plan Note (Signed)
 Plans to continue warfarin Telemetry discontinued, but patient with regular rate and rhythm at bedside

## 2024-10-01 NOTE — Assessment & Plan Note (Addendum)
 Improvement of symptoms today - Neurology signed off with recs for aspirin 81 mg and to continue warfarin - PT/OT t asked for home health - Continue Crestor  20 mg daily

## 2024-10-01 NOTE — Care Management Important Message (Signed)
 Important Message  Patient Details  Name: Jason Salazar MRN: [id number] Date of Birth: [birth year]   Important Message Given:  Yes - Medicare IM     Claretta Deed 10/01/2024, 1:48 PM

## 2024-10-01 NOTE — TOC Initial Note (Signed)
 Transition of Care [facility]) - Initial/Assessment Note    Patient Details  Name: Jason Salazar MRN: [id number] Date of Birth: Apr 27, 1946  [facility] of [facility]) CM/SW Contact:    Sudie Erminio Deems, RN Phone Number: 10/01/2024, 10:14 AM  Clinical Narrative: Patient presented for right sided numbness. PTA patient was from home with spouse. Patient has DME CPAP at home via [employer] and spouse wants DME RW from this agency-DME ordered and will be delivered to the room. Spouse has used [facility] in the past and she asked for referral to be submitted to this agency. Referral submitted and the agency can accept-services to start within 24-48 hours post discharge. Spouse states she will provide transportation home. No further needs identified at this time.   Expected Discharge Plan: Home w Home Health Services Barriers to Discharge: No Barriers Identified   Patient Goals and CMS Choice Patient states their goals for this hospitalization and ongoing recovery are:: Plans to return home with spouse   Choice offered to / list presented to : Spouse (Spouse has used [facility] in the past and wants to use them again)      Expected Discharge Plan and Services In-house Referral: NA Discharge Planning Services: CM Consult Post Acute Care Choice: Home Health Living arrangements for the past 2 months: Single Family Home                 DME Arranged: Walker rolling DME Agency: [facility] Date DME Agency Contacted: 10/01/24 Time DME Agency Contacted: 1013 Representative spoke with at DME Agency: [facility] HH Arranged: PT HH Agency: [facility] ([facility]) Date HH Agency Contacted: 10/01/24 Time HH Agency Contacted: 0830 Representative spoke with at [facility] Agency: [facility]  Prior Living Arrangements/Services Living arrangements for the past 2 months: Single Family Home Lives with:: Spouse Patient language and need for interpreter reviewed:: Yes Do you feel safe going back to the  place where you live?: Yes      Need for Family Participation in Patient Care: Yes (Comment) Care giver support system in place?: Yes (comment) Current home services: DME (CPAP via [facility]) Criminal Activity/Legal Involvement Pertinent to Current Situation/Hospitalization: No - Comment as needed  Activities of Daily Living   ADL Screening (condition at time of admission) Independently performs ADLs?: Yes (appropriate for developmental age) Is the patient deaf or have difficulty hearing?: No Does the patient have difficulty seeing, even when wearing glasses/contacts?: No Does the patient have difficulty concentrating, remembering, or making decisions?: No  Permission Sought/Granted Permission sought to share information with : Family Supports, Case Production designer, theatre/television/film, Oceanographer granted to share information with : Yes, Verbal Permission Granted     Permission granted to share info w AGENCY: [facility] and [facility]        Emotional Assessment Appearance:: Appears stated age       Alcohol / Substance Use: Not Applicable Psych Involvement: No (comment)  Admission diagnosis:  CVA (cerebral vascular accident) (HCC) [I63.9] Cerebrovascular accident (CVA), unspecified mechanism (HCC) [I63.9] Patient Active Problem List   Diagnosis Date Noted   CVA (cerebral vascular accident) (HCC) 09/29/2024   Chronic health problem 09/29/2024   Chronic diastolic CHF (congestive heart failure) (HCC) 09/20/2024   TIA (transient ischemic attack) 09/20/2024   History of CVA (cerebrovascular accident) 09/20/2024   History of gout 09/20/2024   Controlled diabetes mellitus type II without complication (HCC) 09/13/2024   Pain in joint, pelvic region and thigh 09/13/2024   Benign essential hypertension 09/13/2024   Allergic rhinitis due to  allergen 09/13/2024   Hyperlipoproteinemia 09/13/2024   Long term (current) use of anticoagulants 03/25/2024   PAF (paroxysmal atrial fibrillation)  (HCC) 02/04/2024   Chest pressure 02/04/2024   Atypical atrial flutter (HCC) 01/21/2024   Secondary hypercoagulable state 01/21/2024   History of syncope 09/24/2023   Bilateral lower extremity edema 09/24/2023   Class 1 obesity due to excess calories with serious comorbidity and body mass index (BMI) of 31.0 to 31.9 in adult 05/30/2023   Insulin  long-term use (HCC) 05/30/2023   Essential hypertension, benign 05/02/2023   Inadequate sleep hygiene 10/30/2021   AV block, 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   Stage 3a chronic kidney disease (HCC) 10/09/2021   Epilepsy (HCC) 04/19/2019   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/12/2019   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   Partial epilepsy with impairment of consciousness (HCC) 10/04/2013   Encounter for therapeutic drug monitoring 10/04/2013   Hypertensive heart disease    Mixed hyperlipidemia    Allergic rhinitis    H/O prostate cancer    Diabetes (HCC)    Balantidiasis    Hypercholesteremia    Obstructive sleep apnea 05/29/2013   PCP:  Vicci Barnie NOVAK, MD Pharmacy:   [facility] [address] [place] [address] Phone: [phone] Fax: [phone]     Social Drivers of Health (SDOH) Social History: SDOH Screenings   Food Insecurity: No Food Insecurity (09/30/2024)  Housing: Low Risk  (09/30/2024)  Transportation Needs: No Transportation Needs (09/30/2024)  Utilities: Not At Risk (09/30/2024)  Alcohol Screen: Low Risk  (05/18/2024)  Depression (PHQ2-9): Low Risk  (07/20/2024)  Financial Resource Strain: Low Risk  (06/04/2024)  Physical Activity: Insufficiently Active (06/04/2024)  Social Connections: Socially Integrated (09/30/2024)  Stress: No Stress Concern Present (06/04/2024)  Recent Concern: Stress - Stress Concern Present (03/16/2024)  Tobacco Use: Low Risk  (09/29/2024)  Health Literacy: Adequate Health Literacy (03/16/2024)   SDOH Interventions:      Readmission Risk Interventions     No data to display

## 2024-10-01 NOTE — Progress Notes (Signed)
     Daily Progress Note Intern Pager: [phone]  Patient name: Jason Salazar Medical record number: [id number] Date of birth: 1946/04/29 Age: 78 y.o. Gender: male  Primary Care Provider: Vicci Barnie NOVAK, MD Consultants: Neurology Code Status: Full, no blood products  Pt Overview and Major Events to Date:  10/22-admitted  Assessment and Plan:  This is a 78 year old male with diabetes, A-fib, history of CVA, OSA, hypertension, HLD, history of prostate cancer, hep C, CKD, T2DM, CHF who was admitted for right face and upper extremity numbness, MRI negative for CVA, suspect recurrent TIA. Assessment & Plan CVA (cerebral vascular accident) (HCC) Improvement of symptoms today - Neurology signed off with recs for aspirin 81 mg and to continue warfarin - PT/OT t asked for home health - Continue Crestor  20 mg daily PAF (paroxysmal atrial fibrillation) (HCC) Plans to continue warfarin Telemetry discontinued, but patient with regular rate and rhythm at bedside Diabetes (HCC) Last A1c 7.5 received 2 units aspart last 24 hours. - Continue CBG monitoring ACHS and moderate SSI - Continue Farxiga  10 mg Chronic health problem Gout - Continue home Allopurinol  100mg  BID PRN Seizure history - Continue home Vimpat  50mg  BID and Dilantin  ER 300mg  at bedtime. CHF - Continue home Lasix  20mg  daily HLD - Continue home Crestor  20mg  daily. Has been approved for Repatha but not yet started this. Muscle cramps - Continue home Tizanidine 4mg  PRN BID HTN -continue home lisinopril -hydrochlorothiazide  20-12.5mg   FEN/GI: carb modified PPx: warfarin Dispo:Home pending clinical improvement .   Subjective:  Patient with persistent right face and right upper extremity numbness.  No burning pain or tingling.  No weakness.  No recent illnesses.  Objective: Temp:  [98.3 F (36.8 C)-98.7 F (37.1 C)] 98.6 F (37 C) (10/24 0807) Pulse Rate:  [65-95] 78 (10/24 0807) Resp:  [12-24] 19 (10/24 0807) BP:  (107-152)/(60-80) 146/76 (10/24 0807) SpO2:  [85 %-100 %] 98 % (10/24 0807) FiO2 (%):  [21 %] 21 % (10/23 2100) Physical Exam: General: well appearing Male sitting in chair CV: RRR Pulm clear to auscultation bilaterally Neuro: No sensory deficits in the face or upper extremities, no gross strength deficits in the bilateral extremities  Laboratory: None  Imaging/Diagnostic Tests: None  Alena Morrison, Elio, MD 10/01/2024, 9:42 AM  PGY-1, [facility] FPTS Intern pager: [phone], text pages welcome Secure chat group [facility] Teaching Service

## 2024-10-02 ENCOUNTER — Encounter: Payer: Self-pay | Admitting: Internal Medicine

## 2024-10-02 ENCOUNTER — Other Ambulatory Visit: Payer: Self-pay | Admitting: Diagnostic Neuroimaging

## 2024-10-02 ENCOUNTER — Other Ambulatory Visit (HOSPITAL_BASED_OUTPATIENT_CLINIC_OR_DEPARTMENT_OTHER): Payer: Self-pay

## 2024-10-02 ENCOUNTER — Encounter: Payer: Self-pay | Admitting: "Endocrinology

## 2024-10-02 DIAGNOSIS — G40309 Generalized idiopathic epilepsy and epileptic syndromes, not intractable, without status epilepticus: Secondary | ICD-10-CM | POA: Diagnosis not present

## 2024-10-02 DIAGNOSIS — Z8673 Personal history of transient ischemic attack (TIA), and cerebral infarction without residual deficits: Secondary | ICD-10-CM | POA: Diagnosis not present

## 2024-10-02 DIAGNOSIS — J309 Allergic rhinitis, unspecified: Secondary | ICD-10-CM | POA: Diagnosis not present

## 2024-10-02 DIAGNOSIS — N529 Male erectile dysfunction, unspecified: Secondary | ICD-10-CM | POA: Diagnosis not present

## 2024-10-02 DIAGNOSIS — I441 Atrioventricular block, second degree: Secondary | ICD-10-CM | POA: Diagnosis not present

## 2024-10-02 DIAGNOSIS — Z6830 Body mass index (BMI) 30.0-30.9, adult: Secondary | ICD-10-CM | POA: Diagnosis not present

## 2024-10-02 DIAGNOSIS — M109 Gout, unspecified: Secondary | ICD-10-CM | POA: Diagnosis not present

## 2024-10-02 DIAGNOSIS — G4733 Obstructive sleep apnea (adult) (pediatric): Secondary | ICD-10-CM | POA: Diagnosis not present

## 2024-10-02 DIAGNOSIS — E782 Mixed hyperlipidemia: Secondary | ICD-10-CM | POA: Diagnosis not present

## 2024-10-02 DIAGNOSIS — E66811 Obesity, class 1: Secondary | ICD-10-CM | POA: Diagnosis not present

## 2024-10-02 DIAGNOSIS — N1831 Chronic kidney disease, stage 3a: Secondary | ICD-10-CM | POA: Diagnosis not present

## 2024-10-02 DIAGNOSIS — E1140 Type 2 diabetes mellitus with diabetic neuropathy, unspecified: Secondary | ICD-10-CM | POA: Diagnosis not present

## 2024-10-02 DIAGNOSIS — I5032 Chronic diastolic (congestive) heart failure: Secondary | ICD-10-CM | POA: Diagnosis not present

## 2024-10-02 DIAGNOSIS — M25559 Pain in unspecified hip: Secondary | ICD-10-CM | POA: Diagnosis not present

## 2024-10-02 DIAGNOSIS — I130 Hypertensive heart and chronic kidney disease with heart failure and stage 1 through stage 4 chronic kidney disease, or unspecified chronic kidney disease: Secondary | ICD-10-CM | POA: Diagnosis not present

## 2024-10-02 DIAGNOSIS — Z7901 Long term (current) use of anticoagulants: Secondary | ICD-10-CM | POA: Diagnosis not present

## 2024-10-02 DIAGNOSIS — I480 Paroxysmal atrial fibrillation: Secondary | ICD-10-CM | POA: Diagnosis not present

## 2024-10-02 DIAGNOSIS — Z8546 Personal history of malignant neoplasm of prostate: Secondary | ICD-10-CM | POA: Diagnosis not present

## 2024-10-02 DIAGNOSIS — E1122 Type 2 diabetes mellitus with diabetic chronic kidney disease: Secondary | ICD-10-CM | POA: Diagnosis not present

## 2024-10-02 DIAGNOSIS — Z7984 Long term (current) use of oral hypoglycemic drugs: Secondary | ICD-10-CM | POA: Diagnosis not present

## 2024-10-02 DIAGNOSIS — Z556 Problems related to health literacy: Secondary | ICD-10-CM | POA: Diagnosis not present

## 2024-10-02 LAB — CUP PACEART REMOTE DEVICE CHECK
Date Time Interrogation Session: 20251023233117
Implantable Pulse Generator Implant Date: 20231128

## 2024-10-02 MED FILL — Dapagliflozin Propanediol Tab 10 MG (Base Equivalent): ORAL | 90 days supply | Qty: 90 | Fill #1 | Status: AC

## 2024-10-04 ENCOUNTER — Telehealth: Payer: Self-pay

## 2024-10-04 ENCOUNTER — Telehealth: Payer: Self-pay | Admitting: Internal Medicine

## 2024-10-04 ENCOUNTER — Other Ambulatory Visit (HOSPITAL_BASED_OUTPATIENT_CLINIC_OR_DEPARTMENT_OTHER): Payer: Self-pay

## 2024-10-04 ENCOUNTER — Encounter: Payer: Self-pay | Admitting: Diagnostic Neuroimaging

## 2024-10-04 ENCOUNTER — Other Ambulatory Visit: Payer: Self-pay

## 2024-10-04 ENCOUNTER — Ambulatory Visit: Admitting: Diagnostic Neuroimaging

## 2024-10-04 VITALS — BP 145/82 | HR 85 | Ht 63.0 in | Wt 177.0 lb

## 2024-10-04 DIAGNOSIS — G40309 Generalized idiopathic epilepsy and epileptic syndromes, not intractable, without status epilepticus: Secondary | ICD-10-CM

## 2024-10-04 DIAGNOSIS — G459 Transient cerebral ischemic attack, unspecified: Secondary | ICD-10-CM

## 2024-10-04 DIAGNOSIS — Z8673 Personal history of transient ischemic attack (TIA), and cerebral infarction without residual deficits: Secondary | ICD-10-CM

## 2024-10-04 MED ORDER — LACOSAMIDE 50 MG PO TABS
50.0000 mg | ORAL_TABLET | Freq: Two times a day (BID) | ORAL | 5 refills | Status: AC
  Filled 2024-10-04: qty 60, 30d supply, fill #0
  Filled 2024-11-12: qty 60, 30d supply, fill #1
  Filled 2024-12-06 – 2024-12-10 (×2): qty 60, 30d supply, fill #2
  Filled 2024-12-29 – 2025-01-11 (×2): qty 60, 30d supply, fill #3

## 2024-10-04 NOTE — Telephone Encounter (Signed)
 Glucose is looking pretty good without the Tresiba .  Have him stay off of it for now.  And yes, he should go ahead and start the Repatha as well.

## 2024-10-04 NOTE — Telephone Encounter (Signed)
 Patient is requesting to speak with Jason Salazar regarding his medication. He declined discussing any further with me, and says he would only prefer to speak with Jason, [facility] if at all possible.
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
## 2024-10-04 NOTE — Telephone Encounter (Signed)
 The original prescription was discontinued on 09/13/2024 by Margaret Eduard SAUNDERS, MD.

## 2024-10-04 NOTE — Telephone Encounter (Signed)
 Alert remote transmission: Symptom 1 symptom activaiton 10/22 @ 18:22, SR with some non-conducted P waves  Hx of heart block during sleep.   This is a day time event.    10/22 - patient developed one sided numbness/tingling to face and body.  Went to ER was admitted with CVA.  He has follow up with neurology and general cardiology coming up soon.

## 2024-10-04 NOTE — Telephone Encounter (Signed)
 Noted

## 2024-10-04 NOTE — Telephone Encounter (Signed)
 Copied from CRM [id number]. Topic: General - Other >> Oct 04, 2024  3:43 PM Avram MATSU wrote:  Reason for CRM: Jason Salazar is calling from [facility] requesting custodial care 20 hours week to be sent to [facility]. Pt recent had a ER visit.  Please advise [phone]

## 2024-10-05 ENCOUNTER — Other Ambulatory Visit (HOSPITAL_BASED_OUTPATIENT_CLINIC_OR_DEPARTMENT_OTHER): Payer: Self-pay

## 2024-10-05 ENCOUNTER — Other Ambulatory Visit (HOSPITAL_COMMUNITY): Payer: Self-pay

## 2024-10-05 ENCOUNTER — Telehealth: Payer: Self-pay | Admitting: Internal Medicine

## 2024-10-05 DIAGNOSIS — M4316 Spondylolisthesis, lumbar region: Secondary | ICD-10-CM | POA: Diagnosis not present

## 2024-10-05 NOTE — Progress Notes (Signed)
 Remote Loop Recorder Transmission

## 2024-10-05 NOTE — Telephone Encounter (Signed)
 Copied from CRM [id number]. Topic: Clinical - Home Health Verbal Orders >> Oct 05, 2024  9:32 AM Tonda B wrote:  Caller/Agency: [facility] Callback Number: [phone] Service Requested: Physical Therapy Frequency: once a week for 9 weeks Any new concerns about the patient? No

## 2024-10-05 NOTE — Telephone Encounter (Signed)
 I spoke to [place], PT/[facility] and gave authorization for PT visits 1x/week x 9 weeks.

## 2024-10-05 NOTE — Telephone Encounter (Signed)
Please f/u with this.

## 2024-10-06 ENCOUNTER — Other Ambulatory Visit: Payer: Self-pay | Admitting: Internal Medicine

## 2024-10-06 ENCOUNTER — Other Ambulatory Visit (HOSPITAL_COMMUNITY): Payer: Self-pay

## 2024-10-06 ENCOUNTER — Other Ambulatory Visit (HOSPITAL_BASED_OUTPATIENT_CLINIC_OR_DEPARTMENT_OTHER): Payer: Self-pay

## 2024-10-06 ENCOUNTER — Encounter: Payer: Self-pay | Admitting: *Deleted

## 2024-10-06 MED ORDER — LISINOPRIL-HYDROCHLOROTHIAZIDE 20-12.5 MG PO TABS
1.0000 | ORAL_TABLET | Freq: Every day | ORAL | 2 refills | Status: AC
  Filled 2024-10-06: qty 90, 90d supply, fill #0

## 2024-10-06 NOTE — Telephone Encounter (Signed)
 Order for custodial care efaxed to [facility]: [phone].

## 2024-10-06 NOTE — Addendum Note (Signed)
 Addended by: VICCI SOBER B on: 10/06/2024 01:27 PM   Modules accepted: Orders

## 2024-10-06 NOTE — Telephone Encounter (Signed)
 Older placed.

## 2024-10-06 NOTE — Telephone Encounter (Signed)
 You can submit the order. Looking at the dates in your note, you said they can provide service from 09/30/2023-10/04/2023. You meant 2025 right?

## 2024-10-06 NOTE — Telephone Encounter (Signed)
 Correction to my note from 10/05/2024.   PCP send an order to [facility] for 20 hour of Custodial Care post hospitalization 09/29/2024- 10/01/2024.

## 2024-10-07 ENCOUNTER — Ambulatory Visit: Attending: Cardiology | Admitting: *Deleted

## 2024-10-07 ENCOUNTER — Other Ambulatory Visit (HOSPITAL_BASED_OUTPATIENT_CLINIC_OR_DEPARTMENT_OTHER): Payer: Self-pay

## 2024-10-07 ENCOUNTER — Other Ambulatory Visit (HOSPITAL_COMMUNITY): Payer: Self-pay

## 2024-10-07 DIAGNOSIS — Z5181 Encounter for therapeutic drug level monitoring: Secondary | ICD-10-CM | POA: Diagnosis not present

## 2024-10-07 DIAGNOSIS — Z7901 Long term (current) use of anticoagulants: Secondary | ICD-10-CM

## 2024-10-07 DIAGNOSIS — I480 Paroxysmal atrial fibrillation: Secondary | ICD-10-CM

## 2024-10-07 LAB — POCT INR: POC INR: 1.3

## 2024-10-07 NOTE — Patient Instructions (Signed)
 Description   Take 1.5 tablets of warfarin today and then START taking warfarin 1 tablet daily EXCPET FOR 1/2 A tablet on Sunday, Tuesday and Thursday. Recheck INR in 1 week.  Remain consistent with greens each week (1-2 servings per week)  [facility]iac Clearance Fax #[phone] Coumadin  Clinic [phone] Pending Watchman device  *On Dilantin *

## 2024-10-07 NOTE — Progress Notes (Addendum)
 OFFICE NOTE:    Date:  10/08/2024  ID:  Jason Salazar, DOB 1946-01-25, MRN [id number] PCP: Vicci Barnie NOVAK, MD  [facility] Providers Cardiologist:  Stanly DELENA Leavens, MD Electrophysiologist:  Eulas FORBES Furbish, MD        Paroxysmal atrial fibrillation/flutter Has seen EP to consider LAAO (HFpEF) heart failure with preserved ejection fraction  CMR 06/09/23: EF 60, no evidence of sarcoid  TTE 09/21/24: EF 60-65, no RWMA, mod LVh, NL RVSF, trivial MR, bubble study neg  SPECT MPI 02/11/24: EF 39, normal perfusion, int risk due to low EF  Hypertension  Hyperlipidemia  Diabetes mellitus  Chronic kidney disease  Mobitz 1 Hx of TIA, CVA  OSA Hx of seizures [employer]  Prostate CA  Syncope 2023 s/p ILR        Discussed the use of AI scribe software for clinical note transcription with the patient, who gave verbal consent to proceed. History of Present Illness Jason Salazar is a 78 y.o. male for hospital follow up. He was admitted in 10/13-10/14 with TIA symptoms. INR was subtherapeutic at 1.8. MRI was neg for acute findings. Admitted again 10/22-10/24 w TIA symptoms. CT was neg for carotid stenosis. MRI was neg for acute stroke. TTE showed normal EF and neg bubble study. INR was 2 on admit. His INR 10/07/24 was 1.3. He is followed in our coumadin  clinic.   He has been working with home health PT.  He still has right-sided weakness, but this is improving. He has hyperlipidemia and was previously on Crestor , which was discontinued due to concerns about liver issues, although his liver enzymes have been normal. He has not started Repatha, which he has been prescribed.  He has not had symptoms of syncope or near syncope.  He has not had chest pain, pressure, or tightness. No shortness of breath or difficulty breathing when lying flat. Occasional swelling in legs, more on the right side, decreases when elevated. No bleeding or black stools.    ROS-See HPI     Studies Reviewed:      Labs 09/29/24: K 4.3, SCr 1.46, ALT 24,  09/21/24: TC 266, HDL 53, LDL 172, Trig 207 89/75/74 :Hgb 15.3, PLT 154K    Risk Assessment/Calculations: CHA2DS2-VASc Score = 6   This indicates a 9.7% annual risk of stroke. 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) Vascular Disease History: 0 Age Score: 2 Gender Score: 0          Physical Exam:  VS:  BP (!) 150/72   Pulse 78   Ht 5' 3 (1.6 m)   Wt 176 lb (79.8 kg)   SpO2 97%   BMI 31.18 kg/m        Wt Readings from Last 3 Encounters:  10/08/24 176 lb (79.8 kg)  10/04/24 177 lb (80.3 kg)  09/29/24 180 lb 12.4 oz (82 kg)    Constitutional:      Appearance: Healthy appearance. Not in distress.  Pulmonary:     Breath sounds: Normal breath sounds. No wheezing. No rales.  Cardiovascular:     Normal rate. Regular rhythm.     Murmurs: There is no murmur.  Edema:    Peripheral edema present.    Pretibial: bilateral trace edema of the pretibial area. Abdominal:     Palpations: Abdomen is soft.        Assessment and Plan:  Assessment & Plan PAF (paroxysmal atrial fibrillation) (HCC) Recent hospital visits for TIA symptoms, but MRI negative for acute stroke.  EKG did demonstrate normal sinus rhythm discussed transitioning to DOACs like Eliquis or Xarelto to avoid frequent INR monitoring and dietary restrictions. He is interested in this option.  He is still considering left atrial appendage occlusion.  Recent INR was 1.3, below therapeutic range.  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  I will review further with Dr. Arnetha whether or not we could transition him to a DOAC. - Continue Coumadin  with adjusted INR range of 2.5 to 3. - Will discuss with Dr. Arnetha about transitioning to DOACs. - He is still considering LAAO - Follow-up 6 months  ADDENDUM: Reviewed with  Dr. Santo. There is an issue with his Dilantin  interacting with DOACs. Therefore, he was placed on Coumadin . Will adjust INR to 2.5-3 to hopefully keep him in range.  Hypercholesteremia Hyperlipidemia with LDL elevated at 172. Previous use of Crestor  discontinued due to liver concerns, but liver enzymes are normal. He has Repatha but has not started. Discussed importance of lowering LDL to reduce stroke risk. - Follow up with endocrinologist to decide on resuming Crestor  vs starting Repatha. Essential hypertension, benign Blood pressure somewhat elevated today.   - Continue lisinopril  HCTZ 20/12.5 mg daily. - Monitor blood pressure for 2 weeks and send readings for review. Chronic heart failure with preserved ejection fraction (HFpEF) (HCC) Cardiac MRI in 2024 showed normal EF and no cardiac sarcoid. Recent echocardiogram showed EF 60-65% and moderate LVH. Volume status stable. NYHA class IIb. - Continue Farxiga  10 mg daily. - Continue Lasix  20 mg daily. Syncope and collapse History of syncope with loop recorder in place. No recent syncope or near syncope reported. He does have Mobitz 1. - Continue monitoring with loop recorder.         Dispo:  Return in about 6 months (around 04/07/2025) for Routine Follow Up, w/ Dr. Santo.  Signed, Glendia Ferrier, PA-C

## 2024-10-07 NOTE — Progress Notes (Signed)
 Lab Results  Component Value Date   INR 1.3 10/07/2024   INR 1.7 (H) 10/01/2024   INR 1.7 (H) 09/30/2024    Description   Take 1.5 tablets of warfarin today and then START taking warfarin 1 tablet daily EXCPET FOR 1/2 A tablet on Sunday, Tuesday and Thursday. Recheck INR in 1 week.  Remain consistent with greens each week (1-2 servings per week)  Cardiac Clearance Fax #[phone] Coumadin  Clinic [phone] Pending Watchman device  *On Dilantin *

## 2024-10-08 ENCOUNTER — Ambulatory Visit: Attending: Physician Assistant | Admitting: Physician Assistant

## 2024-10-08 ENCOUNTER — Encounter: Payer: Self-pay | Admitting: Physician Assistant

## 2024-10-08 VITALS — BP 150/72 | HR 78 | Ht 63.0 in | Wt 176.0 lb

## 2024-10-08 DIAGNOSIS — I480 Paroxysmal atrial fibrillation: Secondary | ICD-10-CM

## 2024-10-08 DIAGNOSIS — I5032 Chronic diastolic (congestive) heart failure: Secondary | ICD-10-CM | POA: Diagnosis not present

## 2024-10-08 DIAGNOSIS — I10 Essential (primary) hypertension: Secondary | ICD-10-CM

## 2024-10-08 DIAGNOSIS — R55 Syncope and collapse: Secondary | ICD-10-CM | POA: Diagnosis not present

## 2024-10-08 DIAGNOSIS — E7800 Pure hypercholesterolemia, unspecified: Secondary | ICD-10-CM

## 2024-10-08 NOTE — Patient Instructions (Addendum)
 Medication Instructions:   Continue all current medications.   Labwork:  none  Testing/Procedures:  none  Follow-Up:  6 months   Any Other Special Instructions Will Be Listed Below (If Applicable).  Please keep BP log over the next few weeks & update office for provider review.    If you need a refill on your cardiac medications before your next appointment, please call your pharmacy.

## 2024-10-08 NOTE — Assessment & Plan Note (Signed)
 Blood pressure somewhat elevated today.   - Continue lisinopril  HCTZ 20/12.5 mg daily. - Monitor blood pressure for 2 weeks and send readings for review.

## 2024-10-08 NOTE — Assessment & Plan Note (Signed)
 Hyperlipidemia with LDL elevated at 172. Previous use of Crestor  discontinued due to liver concerns, but liver enzymes are normal. He has Repatha but has not started. Discussed importance of lowering LDL to reduce stroke risk. - Follow up with endocrinologist to decide on resuming Crestor  vs starting Repatha.

## 2024-10-08 NOTE — Assessment & Plan Note (Addendum)
 Recent hospital visits for TIA symptoms, but MRI negative for acute stroke.  EKG did demonstrate normal sinus rhythm discussed transitioning to DOACs like Eliquis or Xarelto to avoid frequent INR monitoring and dietary restrictions. He is interested in this option.  He is still considering left atrial appendage occlusion.  Recent INR was 1.3, below therapeutic range.  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  I will review further with Dr. Arnetha whether or not we could transition him to a DOAC. - Continue Coumadin  with adjusted INR range of 2.5 to 3. - Will discuss with Dr. Arnetha about transitioning to DOACs. - He is still considering LAAO - Follow-up 6 months  ADDENDUM: Reviewed with Dr. Santo. There is an issue with his Dilantin  interacting with DOACs. Therefore, he was placed on Coumadin . Will adjust INR to 2.5-3 to hopefully keep him in range.
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
## 2024-10-11 NOTE — Telephone Encounter (Signed)
 Spoke with patient today. The patient expressed concerns about conflicting information regarding his medications. Rosuvastatin  was previously held by cardiology provider due to an isolated elevation in GGT, with AST and ALT remaining within normal limits. Concerned about potential liver injury, the patient was referred to a PharmD for alternative lipid-lowering therapy.  At that time, I initiated Repatha and advised the patient to repeat GGT in 6 weeks, as recommended by cardiology provider. However, this lab was not completed. The intent was to assess whether the statin was the cause, though I would expect concurrent elevations in other liver function tests such as ALT and AST if that were the case.  Following the PharmD visit, the patient was seen by endocrinology, who repeated liver function tests and confirmed normal results. Endocrinology recommended restarting statin therapy.  I agree with this plan and support restarting rosuvastatin  at the previous dose, while continuing Repatha for lipid management. We will repeat LFTs, and a lipid panel approximately 3 months after initiating Repatha and restarting statin to monitor liver function and lipid response.  Explained this to the patient and recommended patient to start taking Repatha and rosuvastatin  as prescribed.  Patient also had questions regarding his insulin . After further review in chart, it is confirmed that patient was instructed patient to hold insulin  for now by endocrinology provider on 10/02/24.

## 2024-10-12 ENCOUNTER — Encounter: Payer: Self-pay | Admitting: Internal Medicine

## 2024-10-12 ENCOUNTER — Ambulatory Visit: Payer: Medicare Other | Admitting: Internal Medicine

## 2024-10-12 VITALS — BP 136/62 | HR 86 | Temp 98.3°F | Ht 63.0 in | Wt 171.6 lb

## 2024-10-12 DIAGNOSIS — G4733 Obstructive sleep apnea (adult) (pediatric): Secondary | ICD-10-CM

## 2024-10-12 NOTE — Patient Instructions (Signed)
 Order - DME Kimber- please change autopap to 5-15, renew supplies, headgear, mask of choice- NO Magnets  Please call if we can help

## 2024-10-13 ENCOUNTER — Ambulatory Visit: Payer: Self-pay | Admitting: Cardiovascular Disease

## 2024-10-15 ENCOUNTER — Ambulatory Visit: Attending: Cardiology | Admitting: Pharmacist

## 2024-10-15 ENCOUNTER — Encounter: Payer: Self-pay | Admitting: Internal Medicine

## 2024-10-15 DIAGNOSIS — Z7901 Long term (current) use of anticoagulants: Secondary | ICD-10-CM

## 2024-10-15 DIAGNOSIS — I480 Paroxysmal atrial fibrillation: Secondary | ICD-10-CM

## 2024-10-15 DIAGNOSIS — Z5181 Encounter for therapeutic drug level monitoring: Secondary | ICD-10-CM

## 2024-10-15 LAB — POCT INR: INR: 1.7 — AB (ref 2.0–3.0)

## 2024-10-15 NOTE — Progress Notes (Signed)
 Description   INR 1.7: Take 2 tablets of warfarin today and then START taking warfarin 1 tablet daily EXCEPT FOR 1/2 A tablet on Tuesday and Thursday. Recheck INR in 1 week.  Remain consistent with greens each week (1-2 servings per week)  Cardiac Clearance Fax #[phone] Coumadin  Clinic [phone] Pending Watchman device  *On Dilantin *  Remember - leafy green vegetables have Vitamin K which can make INR go down. Try to be consistent each week.       Patient was confused regarding Vitamin K foods. Provided additional education and a handout.

## 2024-10-15 NOTE — Patient Instructions (Addendum)
 Description   INR 1.7: Take 2 tablets of warfarin today and then START taking warfarin 1 tablet daily EXCEPT FOR 1/2 A tablet on Tuesday and Thursday. Recheck INR in 1 week.  Remain consistent with greens each week (1-2 servings per week)  Cardiac Clearance Fax #[phone] Coumadin  Clinic [phone] Pending Watchman device  *On Dilantin *  Remember - leafy green vegetables have Vitamin K which can make INR go down. Try to be consistent each week.

## 2024-10-18 ENCOUNTER — Ambulatory Visit: Admitting: Cardiology

## 2024-10-18 ENCOUNTER — Ambulatory Visit: Payer: Medicare Other

## 2024-10-19 NOTE — Telephone Encounter (Signed)
 I called [facility]: [phone]   to follow up on the order for custodial care.  I spoke to Loraine who said they received the referral but they only provide private pay services so they transferred the referral to another [place] office: [phone] that is in network with [employer].     I then spoke to Ryan/ Hedda and he confirmed receipt of the referral  and said that they spoke to the patient 10/11/2024 and at that time, the patient said he would have to check with his wife about the need for the service and he would call them back. Jason Salazar said that they never heard back from him; but he ETTER Jason Salazar) will call the patient today to see if he is interested in the services.

## 2024-10-21 ENCOUNTER — Ambulatory Visit: Attending: Cardiology

## 2024-10-21 DIAGNOSIS — I480 Paroxysmal atrial fibrillation: Secondary | ICD-10-CM | POA: Diagnosis not present

## 2024-10-21 DIAGNOSIS — Z5181 Encounter for therapeutic drug level monitoring: Secondary | ICD-10-CM

## 2024-10-21 DIAGNOSIS — Z7901 Long term (current) use of anticoagulants: Secondary | ICD-10-CM | POA: Diagnosis not present

## 2024-10-21 LAB — POCT INR: INR: 2.3 (ref 2.0–3.0)

## 2024-10-21 NOTE — Patient Instructions (Signed)
 Take another 0.5  tablet of warfarin today and then continue taking warfarin 1 tablet daily EXCEPT FOR 1/2 A tablet on Tuesday and Thursday. Recheck INR in 3 week.  Remain consistent with greens each week (1-2 servings per week)  Cardiac Clearance Fax #[phone] Coumadin  Clinic [phone] Pending Watchman device  *On Dilantin *  Remember - leafy green vegetables have Vitamin K which can make INR go down. Try to be consistent each week.

## 2024-10-21 NOTE — Progress Notes (Signed)
 INR 2.3 Please see anticoagulation encounter Take another 0.5  tablet of warfarin today and then continue taking warfarin 1 tablet daily EXCEPT FOR 1/2 A tablet on Tuesday and Thursday. Recheck INR in 3 week.  Remain consistent with greens each week (1-2 servings per week)  Cardiac Clearance Fax #[phone] Coumadin  Clinic [phone] Pending Watchman device  *On Dilantin *  Remember - leafy green vegetables have Vitamin K which can make INR go down. Try to be consistent each week.

## 2024-10-22 ENCOUNTER — Other Ambulatory Visit: Payer: Self-pay | Admitting: Pharmacist

## 2024-10-22 ENCOUNTER — Encounter: Payer: Self-pay | Admitting: Pharmacist

## 2024-10-22 ENCOUNTER — Telehealth: Payer: Self-pay | Admitting: Pharmacist

## 2024-10-22 NOTE — Telephone Encounter (Signed)
 Spoke with the patient, who requested that the note on the medication list for Repatha be removed since it is no longer on hold. Insulin  will remain marked as paused for now, per endocrinology's recommendation

## 2024-10-22 NOTE — Progress Notes (Signed)
 Pharmacy Quality Measure Review  This patient is appearing on a report for the adherence measure for cholesterol (statin) medications this calendar year.   Medication: rosuvastatin   Per chart review, it looks like this was discontinued over concerns for his liver. No only on Repatha.   Herlene Fleeta Morris, PharmD, JAQUELINE, CPP Clinical Pharmacist [facility] [phone]

## 2024-10-23 ENCOUNTER — Encounter: Payer: Self-pay | Admitting: "Endocrinology

## 2024-10-25 ENCOUNTER — Other Ambulatory Visit: Payer: Self-pay | Admitting: "Endocrinology

## 2024-10-27 DIAGNOSIS — Z8673 Personal history of transient ischemic attack (TIA), and cerebral infarction without residual deficits: Secondary | ICD-10-CM | POA: Diagnosis not present

## 2024-11-01 ENCOUNTER — Ambulatory Visit (INDEPENDENT_AMBULATORY_CARE_PROVIDER_SITE_OTHER): Payer: Self-pay

## 2024-11-01 ENCOUNTER — Ambulatory Visit: Payer: Self-pay

## 2024-11-01 DIAGNOSIS — I480 Paroxysmal atrial fibrillation: Secondary | ICD-10-CM

## 2024-11-02 LAB — CUP PACEART REMOTE DEVICE CHECK
Date Time Interrogation Session: 20251123234202
Implantable Pulse Generator Implant Date: 20231128

## 2024-11-02 NOTE — Progress Notes (Signed)
 Remote Loop Recorder Transmission

## 2024-11-09 ENCOUNTER — Ambulatory Visit: Payer: Self-pay | Admitting: Cardiovascular Disease

## 2024-11-11 ENCOUNTER — Ambulatory Visit: Attending: Cardiology

## 2024-11-11 DIAGNOSIS — I480 Paroxysmal atrial fibrillation: Secondary | ICD-10-CM

## 2024-11-11 DIAGNOSIS — Z7901 Long term (current) use of anticoagulants: Secondary | ICD-10-CM | POA: Diagnosis not present

## 2024-11-11 DIAGNOSIS — Z5181 Encounter for therapeutic drug level monitoring: Secondary | ICD-10-CM | POA: Diagnosis not present

## 2024-11-11 LAB — POCT INR: INR: 4.9 — AB (ref 2.0–3.0)

## 2024-11-11 NOTE — Patient Instructions (Signed)
 Hold today and tomorrow then then continue taking warfarin 1 tablet daily EXCEPT FOR 1/2 A tablet on Tuesday and Thursday. Recheck INR in 3 week.  Remain consistent with greens each week (1-2 servings per week)  Cardiac Clearance Fax #[phone] Coumadin  Clinic [phone] Pending Watchman device  *On Dilantin *  Remember - leafy green vegetables have Vitamin K which can make INR go down. Try to be consistent each week.

## 2024-11-11 NOTE — Progress Notes (Signed)
 INR 4.9 Please see anticoagulation encounter Hold today and tomorrow then then continue taking warfarin 1 tablet daily EXCEPT FOR 1/2 A tablet on Tuesday and Thursday. Recheck INR in 3 week.  Remain consistent with greens each week (1-2 servings per week)  Cardiac Clearance Fax #[phone] Coumadin  Clinic [phone] Pending Watchman device  *On Dilantin *  Remember - leafy green vegetables have Vitamin K which can make INR go down. Try to be consistent each week.

## 2024-11-12 ENCOUNTER — Other Ambulatory Visit: Payer: Self-pay

## 2024-11-12 ENCOUNTER — Other Ambulatory Visit (HOSPITAL_BASED_OUTPATIENT_CLINIC_OR_DEPARTMENT_OTHER): Payer: Self-pay

## 2024-11-16 ENCOUNTER — Other Ambulatory Visit: Payer: Self-pay | Admitting: "Endocrinology

## 2024-11-16 ENCOUNTER — Encounter: Payer: Self-pay | Admitting: "Endocrinology

## 2024-11-16 DIAGNOSIS — E1122 Type 2 diabetes mellitus with diabetic chronic kidney disease: Secondary | ICD-10-CM

## 2024-11-17 ENCOUNTER — Other Ambulatory Visit: Payer: Self-pay

## 2024-11-17 ENCOUNTER — Other Ambulatory Visit (HOSPITAL_BASED_OUTPATIENT_CLINIC_OR_DEPARTMENT_OTHER): Payer: Self-pay

## 2024-11-17 MED ORDER — DAPAGLIFLOZIN PROPANEDIOL 10 MG PO TABS
10.0000 mg | ORAL_TABLET | Freq: Every day | ORAL | 1 refills | Status: AC
  Filled 2024-11-17 – 2024-12-29 (×2): qty 90, 90d supply, fill #0

## 2024-11-17 MED ORDER — FREESTYLE LIBRE 3 PLUS SENSOR MISC
1 refills | Status: AC
  Filled 2024-11-17: qty 6, 90d supply, fill #0

## 2024-11-19 ENCOUNTER — Ambulatory Visit: Payer: Self-pay | Attending: Internal Medicine | Admitting: Internal Medicine

## 2024-11-19 ENCOUNTER — Encounter: Payer: Self-pay | Admitting: Internal Medicine

## 2024-11-19 VITALS — BP 159/73 | HR 80 | Ht 63.0 in | Wt 172.0 lb

## 2024-11-19 DIAGNOSIS — Z09 Encounter for follow-up examination after completed treatment for conditions other than malignant neoplasm: Secondary | ICD-10-CM

## 2024-11-19 DIAGNOSIS — E119 Type 2 diabetes mellitus without complications: Secondary | ICD-10-CM

## 2024-11-19 DIAGNOSIS — E1169 Type 2 diabetes mellitus with other specified complication: Secondary | ICD-10-CM

## 2024-11-19 DIAGNOSIS — Z789 Other specified health status: Secondary | ICD-10-CM

## 2024-11-19 DIAGNOSIS — Z7984 Long term (current) use of oral hypoglycemic drugs: Secondary | ICD-10-CM

## 2024-11-19 DIAGNOSIS — I10 Essential (primary) hypertension: Secondary | ICD-10-CM

## 2024-11-19 DIAGNOSIS — Z794 Long term (current) use of insulin: Secondary | ICD-10-CM | POA: Diagnosis not present

## 2024-11-19 DIAGNOSIS — I6930 Unspecified sequelae of cerebral infarction: Secondary | ICD-10-CM

## 2024-11-19 DIAGNOSIS — N1831 Chronic kidney disease, stage 3a: Secondary | ICD-10-CM

## 2024-11-19 DIAGNOSIS — G4733 Obstructive sleep apnea (adult) (pediatric): Secondary | ICD-10-CM

## 2024-11-19 DIAGNOSIS — E785 Hyperlipidemia, unspecified: Secondary | ICD-10-CM | POA: Diagnosis not present

## 2024-11-19 DIAGNOSIS — Z23 Encounter for immunization: Secondary | ICD-10-CM

## 2024-11-19 DIAGNOSIS — J069 Acute upper respiratory infection, unspecified: Secondary | ICD-10-CM

## 2024-11-19 DIAGNOSIS — E1159 Type 2 diabetes mellitus with other circulatory complications: Secondary | ICD-10-CM

## 2024-11-19 MED ORDER — ZOSTER VAC RECOMB ADJUVANTED 50 MCG/0.5ML IM SUSR: 0.5000 mL | Freq: Once | INTRAMUSCULAR | 0 refills | Status: AC

## 2024-11-19 NOTE — Progress Notes (Signed)
 Patient ID: Jason Salazar, male    DOB: 1946-07-09  MRN: [id number]  CC: Diabetes (DM & HTN f/u. [facility] repatha  side effects Wyn received flu vax.)   Subjective: Jason Salazar is a 78 y.o. male who presents for chronic ds management and hosp f/u.  Wife is with him. His concerns today include:  Patient with history of DM type II, HTN, CKD 3a, HL, atrial flutter/PAF/Mobitz 1, CHFpEF, OSA on CPAP, syncope, seizure disorder, prostate CA (implanted XRT seeds [facility]now [facility]), obesity, gout   Discussed the use of AI scribe software for clinical note transcription with the patient, who gave verbal consent to proceed.  History of Present Illness Vi Jason Salazar is a 78 year old male with a history of TIAs who presents for follow-up care. Has meds with him.  He has been hospitalized twice for transient ischemic attacks (TIAs) in October when he presented with some right-sided numbness.  On initial admission in early October, CTA of the head and neck revealed remote infarcts involving the left corona radiata and left basal ganglia and possible remote infarct in the right parietal lobe.  LDL was 172.  MRI of the brain negative for acute findings.  Echo showed EF of 60 to 65% with left ventricle showing no RWMA.  Agitated saline contrast bubble study was negative. He remains on Coumadin  for management. A follow-up appointment with his neurologist is scheduled in six months. There was a discussion about switching from Coumadin  to Eliquis, but due to interactions with his seizure medication, he remains on Coumadin . His last INR level was 4.9, and his dose was adjusted accordingly.  Wife is requesting letter of necessity for [employer] to allow him to get a bath chair and steps to get up into the bathtub.  Patient reports he still has residual numbness on his right side from his leg all the way up to his face.  Gait sometimes feels unstable.  He has worked with physical therapy.  He has  walker with him today.  He has not had any falls. Bath tub is about 4 feet off the ground.  He has a history of hyperlipidemia and was previously on Crestor , which was discontinued due to abnormal liver function tests. However, last LFT nl on blood test done late October. Cardiologist recommended Repatha  which he has with him today. He has not yet started Repatha  due to concerns about potential side effects, including upper respiratory infections and interactions with his seizure medications.  HTN: Hypertension is managed with lisinopril /hydrochlorothiazide  20/12.5 mg daily. He monitors his blood pressure daily, with recent readings showing variability, including a high of 172 mmHg and a more stable reading of 118/82 mmHg two weeks ago. He has not taken his med today as yet. Kidney funtion not 100% but recent GFRs in 50s.  For his hx of congestive heart failure, he is on Farxiga  10 mg daily and furosemide  20 mg daily.  Diastolic parameters on recent echo were indeterminant.  DM: Followed by Dr. Lenis. He is diabetic and uses Tresiba  insulin  at 25 units daily, along with Farxiga  10 mg. He uses a continuous glucose monitor (Libre sensor). Over the past two weeks, his time in target range was 83%, with an A1c of 7.5% recorded two months ago.  He has obstructive sleep apnea and uses a CPAP machine daily with adjusted settings recently done by his sleep specialist Dr. Neysa.  He is tolerating well.   He reports a recent dry cough with some phlegm, which  he attributes to a cold or flu, and is taking over-the-counter Robitussin for relief.  His wife has healthcare power of attorney documentation with them today.  His healthcare agent is his wife Jason Salazar and alternate healthcare agent is Jason Salazar.  Of note the healthcare power of attorney states that no blood to be administered.     Patient Active Problem List   Diagnosis Date Noted   CVA (cerebral vascular accident) (HCC) 09/29/2024    Chronic health problem 09/29/2024   Chronic diastolic CHF (congestive heart failure) (HCC) 09/20/2024   TIA (transient ischemic attack) 09/20/2024   History of CVA (cerebrovascular accident) 09/20/2024   History of gout 09/20/2024   Controlled diabetes mellitus type II without complication (HCC) 09/13/2024   Pain in joint, pelvic region and thigh 09/13/2024   Benign essential hypertension 09/13/2024   Allergic rhinitis due to allergen 09/13/2024   Hyperlipoproteinemia 09/13/2024   Long term (current) use of anticoagulants 03/25/2024   PAF (paroxysmal atrial fibrillation) (HCC) 02/04/2024   Chest pressure 02/04/2024   Atypical atrial flutter (HCC) 01/21/2024   Secondary hypercoagulable state 01/21/2024   History of syncope 09/24/2023   Bilateral lower extremity edema 09/24/2023   Class 1 obesity due to excess calories with serious comorbidity and body mass index (BMI) of 31.0 to 31.9 in adult 05/30/2023   Insulin  long-term use (HCC) 05/30/2023   Essential hypertension, benign 05/02/2023   Inadequate sleep hygiene 10/30/2021   AV block, 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   Stage 3a chronic kidney disease (HCC) 10/09/2021   Epilepsy (HCC) 04/19/2019   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/12/2019   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   Partial epilepsy with impairment of consciousness (HCC) 10/04/2013   Encounter for therapeutic drug monitoring 10/04/2013   Hypertensive heart disease    Mixed hyperlipidemia    Allergic rhinitis    H/O prostate cancer    Diabetes (HCC)    Balantidiasis    Hypercholesteremia    Obstructive sleep apnea 05/29/2013     Medications Ordered Prior to Encounter[1]  Allergies[2]  Social History   Socioeconomic History   Marital status: Married    Spouse name: Almarie   Number of children: 1   Years of education: HS   Highest education level: 12th grade  Occupational History    Employer: [employer]  Tobacco Use   Smoking status:  Never   Smokeless tobacco: Never   Tobacco comments:    Never smoked 01/21/24  Vaping Use   Vaping status: Never Used  Substance and Sexual Activity   Alcohol use: No   Drug use: No   Sexual activity: Not on file  Other Topics Concern   Not on file  Social History Narrative   Patient is married to [place] and has one son.   Caffeine Use: none; quit a few months ago             Social Drivers of Health   Tobacco Use: Low Risk (11/19/2024)   Patient History    Smoking Tobacco Use: Never    Smokeless Tobacco Use: Never    Passive Exposure: Not on file  Financial Resource Strain: Low Risk (06/04/2024)   Overall Financial Resource Strain (CARDIA)    Difficulty of Paying Living Expenses: Not hard at all  Food Insecurity: No Food Insecurity (09/30/2024)   Epic    Worried About Programme Researcher, Broadcasting/film/video in the Last Year: Never true    Ran Out of Food in the Last Year: Never  true  Transportation Needs: No Transportation Needs (09/30/2024)   Epic    Lack of Transportation (Medical): No    Lack of Transportation (Non-Medical): No  Physical Activity: Insufficiently Active (06/04/2024)   Exercise Vital Sign    Days of Exercise per Week: 1 day    Minutes of Exercise per Session: 10 min  Stress: No Stress Concern Present (06/04/2024)   [employer] of Occupational Health - Occupational Stress Questionnaire    Feeling of Stress: Not at all  Recent Concern: Stress - Stress Concern Present (03/16/2024)   [employer] of Occupational Health - Occupational Stress Questionnaire    Feeling of Stress : To some extent  Social Connections: Socially Integrated (09/30/2024)   Social Connection and Isolation Panel    Frequency of Communication with Friends and Family: More than three times a week    Frequency of Social Gatherings with Friends and Family: Once a week    Attends Religious Services: More than 4 times per year    Active Member of [employer] or Organizations: Yes    Attends Tax Inspector Meetings: Never    Marital Status: Married  Catering Manager Violence: Not At Risk (09/30/2024)   Epic    Fear of Current or Ex-Partner: No    Emotionally Abused: No    Physically Abused: No    Sexually Abused: No  Depression (PHQ2-9): Low Risk (07/20/2024)   Depression (PHQ2-9)    PHQ-2 Score: 1  Alcohol Screen: Low Risk (05/18/2024)   Alcohol Screen    Last Alcohol Screening Score (AUDIT): 0  Housing: Low Risk (09/30/2024)   Epic    Unable to Pay for Housing in the Last Year: No    Number of Times Moved in the Last Year: 0    Homeless in the Last Year: No  Utilities: Not At Risk (09/30/2024)   Epic    Threatened with loss of utilities: No  Health Literacy: Adequate Health Literacy (03/16/2024)   B1300 Health Literacy    Frequency of need for help with medical instructions: Never    Family History  Problem Relation Age of Onset   CVA Mother    Sleep apnea Brother    Thyroid  cancer Father    Prostate cancer Father     Past Surgical History:  Procedure Laterality Date   cyst removed from right kidney      ROS: Review of Systems Negative except as stated above  PHYSICAL EXAM: BP (!) 159/73   Pulse 80   Ht 5' 3 (1.6 m)   Wt 172 lb (78 kg)   SpO2 97%   BMI 30.47 kg/m   Physical Exam   General appearance - alert, well appearing, older AAM and in no distress Mental status - normal mood, behavior, speech, dress, motor activity, and thought processes Neck - supple, no significant adenopathy Chest - clear to auscultation, no wheezes, rales or rhonchi, symmetric air entry Heart -patient sounds to be in sinus rhythm at this time. Neurological -power lower extremities 5/5 distally bilaterally.  Proximally 4/5 on the right, 5/5 on the left.  Gait is slow with low foot to floor clearance.  Slight imbalance when he makes turns.  He has his walker with him but did ambulate unassisted down the hall. Extremities -no lower extremity edema.     Latest Ref Rng &  Units 09/29/2024   11:08 PM 09/29/2024    8:34 PM 09/21/2024    1:28 AM  CMP  Glucose 70 - 99 mg/dL  130  175  119   BUN 8 - 23 mg/dL 26  34  25   Creatinine 0.61 - 1.24 mg/dL 8.53  8.29  8.63   Sodium 135 - 145 mmol/L 137  139  137   Potassium 3.5 - 5.1 mmol/L 4.3  5.3  4.2   Chloride 98 - 111 mmol/L 106  105  103   CO2 22 - 32 mmol/L 22   23   Calcium  8.9 - 10.3 mg/dL 8.4   8.5   Total Protein 6.5 - 8.1 g/dL 6.3     Total Bilirubin 0.0 - 1.2 mg/dL 0.8     Alkaline Phos 38 - 126 U/L 109     AST 15 - 41 U/L 25     ALT 0 - 44 U/L 24      Lipid Panel     Component Value Date/Time   CHOL 266 (H) 09/21/2024 0128   CHOL 208 (H) 08/31/2024 1213   TRIG 207 (H) 09/21/2024 0128   HDL 53 09/21/2024 0128   HDL 61 08/31/2024 1213   CHOLHDL 5.0 09/21/2024 0128   VLDL 41 (H) 09/21/2024 0128   LDLCALC 172 (H) 09/21/2024 0128   LDLCALC 116 (H) 08/31/2024 1213   LDLCALC 83 03/19/2021 0913    CBC    Component Value Date/Time   WBC 5.7 10/01/2024 0131   RBC 5.45 10/01/2024 0131   HGB 15.3 10/01/2024 0131   HGB 15.4 09/18/2022 0939   HCT 48.6 10/01/2024 0131   HCT 46.5 09/18/2022 0939   PLT 154 10/01/2024 0131   PLT 155 09/18/2022 0939   MCV 89.2 10/01/2024 0131   MCV 84 09/18/2022 0939   MCH 28.1 10/01/2024 0131   MCHC 31.5 10/01/2024 0131   RDW 14.0 10/01/2024 0131   RDW 12.6 09/18/2022 0939   LYMPHSABS 1.4 09/29/2024 2035   LYMPHSABS 1.3 09/18/2022 0939   MONOABS 0.6 09/29/2024 2035   EOSABS 0.0 09/29/2024 2035   EOSABS 0.0 09/18/2022 0939   BASOSABS 0.0 09/29/2024 2035   BASOSABS 0.0 09/18/2022 9060   Lab Results  Component Value Date   HGBA1C 7.5 (H) 09/20/2024    ASSESSMENT AND PLAN: 1. Hospital discharge follow-up (Primary)   2. History of cerebrovascular accident (CVA) with residual deficit Pt has had TIAs with imaging studies showing remote infarcts Good diabetes, blood pressure and cholesterol control are important. See discussion under #6 regarding  HL. -Letter will be written requesting shower/bath chair and steps to get up into the bathtub.  3. Hypertension associated with diabetes (HCC) Not at goal.  He has not taken his lisinopril /HCTZ as yet for the morning.  Advised to do so once he returns home. - Monitor blood pressure daily and report via Mychart if consistently above goal of 130/80.  4. Type 2 diabetes mellitus with other specified complication, with long-term current use of insulin  (HCC) 5. Diabetes mellitus treated with oral medication (HCC) Blood sugars are good based on review of his CGM.  Followed by endocrinology.  Continue Tresiba  insulin  at 25 units daily, along with Farxiga  10 mg.  6. Hyperlipidemia associated with type 2 diabetes mellitus (HCC) Went over potential side effects of Repatha  with pt based on review of UpToDate.  Potential side effects include upper respiratory symptoms.  Advised patient that every drug has potential side effects but that does not mean that he will develop all of these potential side effects.  Patient still seems skeptical about starting the Repatha .  Advised that he has  further discussion about this with his cardiologist as well.  In the meantime however his cholesterol levels are elevated and given that he has diabetes, recent TIAs and remote infarcts on brain imaging studies, I suggest he may consider getting back on statin therapy.  I have reviewed his last few LFTs with most recent 1 being normal.  Wife has lab slip from the cardiologist requesting lipid panel be checked today and resulted to them. - Lipid panel  7. CKD stage 3a, GFR 45-59 ml/min (HCC) stable  8. OSA on CPAP Tolerating CPAP better since pressure was adjusted by Dr. Neysa.  Continue to use his CPAP consistently.  9. Patient has active power of attorney for health care Copy given to lead person at our front desk staff so that his HCPOA document is scanned in chart in the appropriate place.   10. Need for shingles  vaccine Printed rxn given for him to start Shingrix vaccine series. Advised that it can cause soreness and redness at the inj site that can last several days.  - Zoster Vaccine Adjuvanted [facility]) injection; Inject 0.5 mLs into the muscle once for 1 dose.  Dispense: 0.5 mL; Refill: 0  11: URI Okay to use Diabetic Tussin OTC PRN.   Patient was given the opportunity to ask questions.  Patient verbalized understanding of the plan and was able to repeat key elements of the plan.   This documentation was completed using Paediatric nurse.  Any transcriptional errors are unintentional.  Orders Placed This Encounter  Procedures   Lipid panel     Requested Prescriptions   Signed Prescriptions Disp Refills   Zoster Vaccine Adjuvanted [facility]) injection 0.5 mL 0    Sig: Inject 0.5 mLs into the muscle once for 1 dose.    Return in about 4 months (around 03/20/2025).  Barnie Louder, MD, FACP     [1]  Current Outpatient Medications on File Prior to Visit  Medication Sig Dispense Refill   acetaminophen  (TYLENOL ) 500 MG tablet Take 500 mg by mouth every 6 (six) hours as needed.     Apoaequorin (PREVAGEN PO) Take 1 tablet by mouth See admin instructions. Prevagen chewable tablets - Chew 1 tablet by mouth once a day     aspirin  EC 81 MG tablet Take 81 mg by mouth in the morning.     b complex vitamins tablet Take 1 tablet by mouth daily.     bimatoprost  (LUMIGAN ) 0.01 % SOLN Instill 1 drop into both eyes at bedtime 7.5 mL 6   Cholecalciferol (VITAMIN D3) 1000 units CAPS Take 1,000 Units by mouth daily.     Continuous Glucose Sensor (FREESTYLE LIBRE 3 PLUS SENSOR) MISC Change sensor every 15 days. 6 each 1   dapagliflozin  propanediol (FARXIGA ) 10 MG TABS tablet Take 1 tablet (10 mg total) by mouth daily. 90 tablet 1   DILANTIN  100 MG ER capsule Take 3 capsules (300 mg total) by mouth at bedtime. 90 capsule 12   furosemide  (LASIX ) 20 MG tablet Take 1 tablet (20 mg  total) by mouth daily. 90 tablet 3   lacosamide  (VIMPAT ) 50 MG TABS tablet Take 1 tablet (50 mg total) by mouth 2 (two) times daily. 60 tablet 5   lisinopril -hydrochlorothiazide  (ZESTORETIC ) 20-12.5 MG tablet Take 1 tablet by mouth daily. 90 tablet 2   Multiple Vitamins-Minerals (ONE-A-DAY MENS 50+ ADVANTAGE PO) Take 1 tablet by mouth daily with breakfast.     Vitamin C 250 MG TABS Take 250 mg by mouth  daily. (Patient taking differently: Take 250 mg by mouth daily. Patient taking 500 mg daily)     warfarin (COUMADIN ) 5 MG tablet Take 1 tablet (5 mg total) by mouth daily. 30 tablet 11   [Paused] allopurinol  (ZYLOPRIM ) 100 MG tablet Take 100 mg by mouth 2 (two) times daily as needed (for gout flares). (Patient not taking: Reported on 11/19/2024)     Blood Pressure Monitoring (BLOOD PRESSURE CUFF) MISC Use to check blood pressure daily. 1 each 0   Evolocumab  (REPATHA  SURECLICK) 140 MG/ML SOAJ Inject 140 mg into the skin every 14 (fourteen) days. (Patient not taking: Reported on 11/19/2024) 2 mL 2   [Paused] insulin  degludec (TRESIBA ) 100 UNIT/ML FlexTouch Pen Inject 26 Units into the skin at bedtime. (Patient not taking: Reported on 11/19/2024) 15 mL 1   No current facility-administered medications on file prior to visit.  [2]  Allergies Allergen Reactions   Atorvastatin Other (See Comments)    Light-headedness

## 2024-11-19 NOTE — Patient Instructions (Signed)
°  VISIT SUMMARY: Today, we reviewed your current health status and made adjustments to your treatment plan for various conditions. We discussed your recent transient ischemic attack (TIA), blood pressure management, heart failure, diabetes, chronic kidney disease, dyslipidemia, and obstructive sleep apnea. We also covered general health maintenance, including vaccinations and upcoming appointments.  YOUR PLAN: -TRANSIENT ISCHEMIC ATTACK: A transient ischemic attack (TIA) is a temporary period of symptoms similar to those of a stroke. Your INR level was high at 4.9, so we adjusted your Coumadin  dose. Continue taking Coumadin  as prescribed and follow up with cardiology for INR monitoring. You have a neurology follow-up in six months.  -HYPERTENSION: Hypertension is high blood pressure. Your blood pressure readings have been variable. Continue taking lisinopril  hydrochlorothiazide  20/12.5 mg daily, monitor your blood pressure daily, and report if it is consistently above the goal of 130/80. Ensure you take your medication as prescribed.  -HEART FAILURE: Heart failure means your heart doesn't pump blood as well as it should. Continue taking Farxiga  10 mg daily and furosemide  20 mg daily. Your kidney function is stable.  -TYPE 2 DIABETES MELLITUS: Type 2 diabetes is a condition that affects the way your body processes blood sugar. Your glucose control is good with 83% time in range. Continue taking Tresiba  25 units daily and Farxiga  10 mg daily. Monitor your blood glucose levels daily. You have a diabetic eye exam scheduled next Wednesday.  -CHRONIC KIDNEY DISEASE, STAGE 3A: Chronic kidney disease means your kidneys are damaged and can't filter blood as well as they should. Your kidney function is stable. Continue with your current management and monitoring.  -DYSLIPIDEMIA: Dyslipidemia means you have abnormal levels of lipids in your blood. You stopped taking Crestor  due to liver function issues. Discuss  your concerns about starting Repatha  with your cardiologist.  -OBSTRUCTIVE SLEEP APNEA: Obstructive sleep apnea is a condition where your breathing stops and starts during sleep. Continue using your CPAP machine with the current settings, which you are tolerating well.  -GENERAL HEALTH MAINTENANCE: You have received your flu, COVID, and RSV vaccines. The shingles vaccine is recommended for those over 50. We will administer the shingles vaccine series if you have not already received it. Ensure the results of your diabetic eye exam are sent to your primary care provider.  INSTRUCTIONS: Follow up with cardiology for INR monitoring and with neurology in six months. Monitor your blood pressure and blood glucose levels daily. Attend your diabetic eye exam next Wednesday. Discuss starting Repatha  with your cardiologist. Ensure you receive the shingles vaccine if you haven't already.                      Contains text generated by [employer]bridge.                                 Contains text generated by [employer]bridge.

## 2024-11-20 ENCOUNTER — Ambulatory Visit: Payer: Self-pay | Admitting: Internal Medicine

## 2024-11-20 ENCOUNTER — Encounter: Payer: Self-pay | Admitting: Internal Medicine

## 2024-11-20 LAB — LIPID PANEL
Chol/HDL Ratio: 5.1 ratio — ABNORMAL HIGH (ref 0.0–5.0)
Cholesterol, Total: 278 mg/dL — ABNORMAL HIGH (ref 100–199)
HDL: 54 mg/dL (ref >39)
LDL Chol Calc (NIH): 192 mg/dL — ABNORMAL HIGH (ref 0–99)
Triglycerides: 171 mg/dL — ABNORMAL HIGH (ref 0–149)
VLDL Cholesterol Cal: 32 mg/dL (ref 5–40)

## 2024-11-22 ENCOUNTER — Other Ambulatory Visit (HOSPITAL_BASED_OUTPATIENT_CLINIC_OR_DEPARTMENT_OTHER): Payer: Self-pay

## 2024-11-22 ENCOUNTER — Ambulatory Visit: Payer: Medicare Other

## 2024-11-23 ENCOUNTER — Ambulatory Visit: Admitting: Internal Medicine

## 2024-11-23 ENCOUNTER — Other Ambulatory Visit: Payer: Self-pay

## 2024-11-23 ENCOUNTER — Other Ambulatory Visit (HOSPITAL_BASED_OUTPATIENT_CLINIC_OR_DEPARTMENT_OTHER): Payer: Self-pay

## 2024-11-24 ENCOUNTER — Other Ambulatory Visit: Payer: Self-pay

## 2024-11-24 ENCOUNTER — Other Ambulatory Visit (HOSPITAL_BASED_OUTPATIENT_CLINIC_OR_DEPARTMENT_OTHER): Payer: Self-pay

## 2024-11-24 ENCOUNTER — Other Ambulatory Visit: Payer: Self-pay | Admitting: Internal Medicine

## 2024-11-24 MED ORDER — ROSUVASTATIN CALCIUM 20 MG PO TABS
20.0000 mg | ORAL_TABLET | Freq: Every day | ORAL | 0 refills | Status: AC
  Filled 2024-11-24: qty 90, 90d supply, fill #0

## 2024-11-26 ENCOUNTER — Other Ambulatory Visit (HOSPITAL_BASED_OUTPATIENT_CLINIC_OR_DEPARTMENT_OTHER): Payer: Self-pay

## 2024-11-26 MED ORDER — ZOSTER VAC RECOMB ADJUVANTED 50 MCG/0.5ML IM SUSR
0.5000 mL | Freq: Once | INTRAMUSCULAR | 0 refills | Status: AC
  Filled 2024-11-26: qty 0.5, 1d supply, fill #0

## 2024-11-28 ENCOUNTER — Telehealth: Payer: Self-pay | Admitting: Internal Medicine

## 2024-11-28 NOTE — Telephone Encounter (Signed)
-----   Message from Stanly Leavens, MD sent at 11/20/2024  2:14 PM EST ----- Regarding: RE: Hyperlipidemia I think thats quite reasonable. Ultimately our goal is to take an aggressive goal for his prevention, but we must balance that with his autonomy. If he has questions about this therapy, Im happy to bring him back in to see me or my team to discuss this further. ----- Message ----- From: Vicci Barnie NOVAK, MD Sent: 11/20/2024   7:57 AM EST To: Glendia ONEIDA Ferrier, PA-C; Mahesh A Chandrasekhar# Subject: Hyperlipidemia                                 I am the PCP for this pt. I saw him yesterday in f/u and he brought lab slip from your office for lipid profile which I did. Please see results in system. This patient was previously on rosuvastatin  which was discontinued by one of your NP's due to mild elevation in alkaline phosphatase level a few months ago.  He was started on Repatha  instead.  Patient has the Repatha  but has not taken it as yet.  He read potential side effects including upper respiratory symptoms and is skeptical about taking the medication.  I advised him that if he is not going to take the Repatha , he should at least get back on statin therapy and we can monitor his LFTs. Your thoughts?

## 2024-12-02 ENCOUNTER — Ambulatory Visit: Payer: Self-pay

## 2024-12-02 DIAGNOSIS — I480 Paroxysmal atrial fibrillation: Secondary | ICD-10-CM | POA: Diagnosis not present

## 2024-12-03 ENCOUNTER — Ambulatory Visit: Attending: Cardiology

## 2024-12-03 DIAGNOSIS — I480 Paroxysmal atrial fibrillation: Secondary | ICD-10-CM

## 2024-12-03 DIAGNOSIS — Z5181 Encounter for therapeutic drug level monitoring: Secondary | ICD-10-CM | POA: Diagnosis not present

## 2024-12-03 DIAGNOSIS — Z7901 Long term (current) use of anticoagulants: Secondary | ICD-10-CM

## 2024-12-03 LAB — POCT INR: INR: 5.9 — AB (ref 2.0–3.0)

## 2024-12-03 LAB — CUP PACEART REMOTE DEVICE CHECK
Date Time Interrogation Session: 20251224232553
Implantable Pulse Generator Implant Date: 20231128

## 2024-12-03 NOTE — Progress Notes (Signed)
 Remote Loop Recorder Transmission

## 2024-12-03 NOTE — Progress Notes (Signed)
"   INR 5.9 Please see anticoagulation encounter Hold Saturday, Sunday and Monday then continue taking warfarin 1 tablet daily EXCEPT FOR 1/2 A tablet on Tuesday and Thursday. Recheck INR in 2 week.  Remain consistent with greens each week (1-2 servings per week)  Cardiac Clearance Fax #[phone] Coumadin  Clinic [phone] Pending Watchman device  *On Dilantin *  Remember - leafy green vegetables have Vitamin K which can make INR go down. Try to be consistent each week.  "

## 2024-12-03 NOTE — Patient Instructions (Signed)
 Hold Saturday, Sunday and Monday then continue taking warfarin 1 tablet daily EXCEPT FOR 1/2 A tablet on Tuesday and Thursday. Recheck INR in 2 week.  Remain consistent with greens each week (1-2 servings per week)  Cardiac Clearance Fax #[phone] Coumadin  Clinic [phone] Pending Watchman device  *On Dilantin *  Remember - leafy green vegetables have Vitamin K which can make INR go down. Try to be consistent each week.

## 2024-12-06 ENCOUNTER — Other Ambulatory Visit (HOSPITAL_BASED_OUTPATIENT_CLINIC_OR_DEPARTMENT_OTHER): Payer: Self-pay

## 2024-12-06 ENCOUNTER — Other Ambulatory Visit: Payer: Self-pay

## 2024-12-07 ENCOUNTER — Other Ambulatory Visit (HOSPITAL_BASED_OUTPATIENT_CLINIC_OR_DEPARTMENT_OTHER): Payer: Self-pay

## 2024-12-08 ENCOUNTER — Ambulatory Visit: Payer: Self-pay | Admitting: Cardiovascular Disease

## 2024-12-17 ENCOUNTER — Ambulatory Visit: Attending: Cardiology

## 2024-12-17 ENCOUNTER — Encounter: Payer: Self-pay | Admitting: Internal Medicine

## 2024-12-17 DIAGNOSIS — Z7901 Long term (current) use of anticoagulants: Secondary | ICD-10-CM

## 2024-12-17 DIAGNOSIS — Z5181 Encounter for therapeutic drug level monitoring: Secondary | ICD-10-CM | POA: Diagnosis not present

## 2024-12-17 DIAGNOSIS — I480 Paroxysmal atrial fibrillation: Secondary | ICD-10-CM | POA: Diagnosis not present

## 2024-12-17 LAB — POCT INR: INR: 2.5 (ref 2.0–3.0)

## 2024-12-17 NOTE — Progress Notes (Signed)
"   INR 2.5 Please see anticoagulation encounter continue taking warfarin 1 tablet daily EXCEPT FOR 1/2 A tablet on Tuesday and Thursday. Recheck INR in 4 weeks.  Remain consistent with greens each week (1-2 servings per week)  Cardiac Clearance Fax #[phone] Coumadin  Clinic [phone] Pending Watchman device  *On Dilantin *  Remember - leafy green vegetables have Vitamin K which can make INR go down. Try to be consistent each week.  "

## 2024-12-17 NOTE — Patient Instructions (Signed)
 continue taking warfarin 1 tablet daily EXCEPT FOR 1/2 A tablet on Tuesday and Thursday. Recheck INR in 4 weeks.  Remain consistent with greens each week (1-2 servings per week)  Cardiac Clearance Fax #[phone] Coumadin  Clinic [phone] Pending Watchman device  *On Dilantin *  Remember - leafy green vegetables have Vitamin K which can make INR go down. Try to be consistent each week.

## 2024-12-22 ENCOUNTER — Telehealth: Payer: Self-pay | Admitting: Internal Medicine

## 2024-12-22 NOTE — Telephone Encounter (Signed)
 New message    Communication  Reason for CRM: Pt's wife Almarie is calling regarding [employer], they sent a denial pt needed auth for sleep apnea machine and supplies. Almarie stated they did not receive auth for supplies and they are calling to f/u regarding this. Pt was seeing Dr.Young.

## 2024-12-23 ENCOUNTER — Telehealth: Payer: Self-pay | Admitting: Internal Medicine

## 2024-12-23 NOTE — Telephone Encounter (Signed)
 Copied from CRM [id number]. Topic: Clinical - Home Health Verbal Orders >> Dec 23, 2024  9:00 AM   Pinkey ORN wrote:  Caller/Agency: [facility]  Callback Number: [phone] Service Requested: Physical Therapy Frequency: 1 x 9  Any new concerns about the patient? Yes, states he has concerns about patient's memory and is wanting for speech therapy to go out as well.

## 2024-12-23 NOTE — Telephone Encounter (Signed)
 Called & spoke to Jason Salazar at [facility]. Verbal orders given for PT.   Dr.Johnson please advise of SLP. Jason stated that the patient is very forgetful and is having some cognitive issues. Wife is very concerned. Please advise if ok for SLP. Thank you.

## 2024-12-23 NOTE — Telephone Encounter (Signed)
 Jane: please see message. Pt had sent me Mychart message on Wednesday telling me he recently completed home P.T and saw his ortho. He wanted ortho to refer for out pt P.T was my impression. Also please find out from caller how is request for SLP going to help with his memory? If he is having memory issues maybe I need to eval.

## 2024-12-24 NOTE — Telephone Encounter (Signed)
 We had just ordered CPAP Nov 2025- order was sent to [facility]  I called [facility] and spoke with the [facility]  She states they need clinical notes to process the CPAP order Last ov note faxed to [facility] at [phone]  Fax confirmation received  I called to let the pt's spouse know and there was no answer- LMTCB.

## 2024-12-24 NOTE — Telephone Encounter (Signed)
 Copied from CRM [id number]. Topic: Clinical - Order For Equipment >> Dec 24, 2024 10:32 AM Russell PARAS wrote: Reason for CRM:   Pt's wife returning call from [place]. Reviewed chart and advised of note left by Sonny concerning CPAP order, pt's wife verbalized understanding. NFN   Nothing further needed.

## 2024-12-24 NOTE — Telephone Encounter (Signed)
 I spoke to Marty, PT and he explained that he did the mini-cognitive eval with the patient during his visit and the patient was not able to recall any of the questions.  Jerel then spoke to the patient's wife and she explained that he has been more and more forgetful.  I told Jerel that I would share this observation with Dr Vicci and she may want to see the patient before requesting SLP.  Jerel said the SLP would be able to address some cognitive -communication skills.

## 2024-12-29 ENCOUNTER — Other Ambulatory Visit: Payer: Self-pay

## 2024-12-29 ENCOUNTER — Other Ambulatory Visit (HOSPITAL_BASED_OUTPATIENT_CLINIC_OR_DEPARTMENT_OTHER): Payer: Self-pay

## 2024-12-29 NOTE — Telephone Encounter (Signed)
 Okay for Memory Therapy.

## 2024-12-29 NOTE — Telephone Encounter (Signed)
 I called Jason Salazar, PT/[facility]: [phone] to provide approval for SLP and I had to leave a message requesting a call back.

## 2024-12-30 ENCOUNTER — Other Ambulatory Visit: Payer: Self-pay

## 2024-12-30 ENCOUNTER — Other Ambulatory Visit (HOSPITAL_BASED_OUTPATIENT_CLINIC_OR_DEPARTMENT_OTHER): Payer: Self-pay

## 2024-12-31 ENCOUNTER — Other Ambulatory Visit (HOSPITAL_BASED_OUTPATIENT_CLINIC_OR_DEPARTMENT_OTHER): Payer: Self-pay

## 2024-12-31 MED ORDER — EMBECTA PEN NEEDLE ULTRAFINE 31G X 5 MM MISC
5 refills | Status: AC
  Filled 2024-12-31: qty 300, 90d supply, fill #0

## 2025-01-01 ENCOUNTER — Telehealth: Payer: Self-pay | Admitting: Internal Medicine

## 2025-01-01 NOTE — Telephone Encounter (Signed)
-----   Message from Stanly Leavens, MD sent at 12/31/2024 10:51 AM EST ----- Regarding: RE: Question regarding Coumadin  Agreed ----- Message ----- From: Lelon Glendia DASEN, PA-C Sent: 12/30/2024   1:27 PM EST To: Barnie KATHEE Louder, MD; Mahesh A Chandrasekha# Subject: RE: Question regarding Coumadin                 We don't usually recommend holding Coumadin  for simple dental extractions. It should be ok to use lidocaine with epi. I'm not sure if I have ever seen that question from a dentist. But Lidocaine without would definitely eliminate any potential issue.  Scott ----- Message ----- From: Louder Barnie KATHEE, MD Sent: 12/30/2024   8:44 AM EST To: Glendia DASEN Lelon, PA-C; Mahesh A Chandrasekhar# Subject: Question regarding Coumadin                     Pt scheduled to have 1 tooth extracted using local lidocaine and epinephrine. Does Coumadin  ned to be held and if so for how many days prior and is it okay to use epinephrine?

## 2025-01-02 ENCOUNTER — Ambulatory Visit (INDEPENDENT_AMBULATORY_CARE_PROVIDER_SITE_OTHER): Payer: Self-pay

## 2025-01-02 DIAGNOSIS — I480 Paroxysmal atrial fibrillation: Secondary | ICD-10-CM

## 2025-01-03 ENCOUNTER — Ambulatory Visit: Payer: Self-pay

## 2025-01-03 LAB — CUP PACEART REMOTE DEVICE CHECK
Date Time Interrogation Session: 20260124233640
Implantable Pulse Generator Implant Date: 20231128

## 2025-01-04 ENCOUNTER — Ambulatory Visit: Payer: Self-pay | Admitting: Cardiovascular Disease

## 2025-01-06 NOTE — Progress Notes (Signed)
 Remote Loop Recorder Transmission

## 2025-01-11 ENCOUNTER — Encounter: Payer: Self-pay | Admitting: Internal Medicine

## 2025-01-11 ENCOUNTER — Other Ambulatory Visit (HOSPITAL_BASED_OUTPATIENT_CLINIC_OR_DEPARTMENT_OTHER): Payer: Self-pay

## 2025-01-11 ENCOUNTER — Telehealth: Payer: Self-pay | Admitting: Internal Medicine

## 2025-01-12 ENCOUNTER — Other Ambulatory Visit (HOSPITAL_COMMUNITY): Payer: Self-pay

## 2025-01-14 ENCOUNTER — Ambulatory Visit: Admitting: Pharmacist

## 2025-01-14 DIAGNOSIS — I480 Paroxysmal atrial fibrillation: Secondary | ICD-10-CM

## 2025-01-14 DIAGNOSIS — Z5181 Encounter for therapeutic drug level monitoring: Secondary | ICD-10-CM

## 2025-01-14 DIAGNOSIS — Z7901 Long term (current) use of anticoagulants: Secondary | ICD-10-CM

## 2025-01-14 LAB — POCT INR: INR: 3.8 — AB (ref 2.0–3.0)

## 2025-01-14 NOTE — Patient Instructions (Signed)
 Description   INR 3.8: Skip dose tomorrow and then continue taking warfarin 1 tablet daily EXCEPT FOR 1/2 A tablet on Tuesday and Thursday. Recheck INR in 2 weeks.  Remain consistent with greens each week (1-2 servings per week)  [facility] Fax #[phone] Coumadin  Clinic [phone] Pending Watchman device  *On Dilantin *  Remember - leafy green vegetables have Vitamin K which can make INR go down. Try to be consistent each week.

## 2025-01-14 NOTE — Progress Notes (Signed)
 Description   INR 3.8: Skip dose tomorrow and then continue taking warfarin 1 tablet daily EXCEPT FOR 1/2 A tablet on Tuesday and Thursday. Recheck INR in 2 weeks.  Remain consistent with greens each week (1-2 servings per week)  [facility]iac Clear[facility] Fax #[phone] Coumadin  Clinic [phone] Pending Watchman device  *On Dilantin *  Remember - leafy green vegetables have Vitamin K which can make INR go down. Try to be consistent each week.

## 2025-01-27 ENCOUNTER — Ambulatory Visit: Admitting: "Endocrinology

## 2025-01-31 ENCOUNTER — Ambulatory Visit

## 2025-02-02 ENCOUNTER — Ambulatory Visit: Payer: Self-pay

## 2025-02-03 ENCOUNTER — Ambulatory Visit: Payer: Self-pay

## 2025-03-05 ENCOUNTER — Ambulatory Visit

## 2025-03-21 ENCOUNTER — Ambulatory Visit: Payer: Self-pay | Admitting: Internal Medicine

## 2025-04-04 ENCOUNTER — Ambulatory Visit: Admitting: Diagnostic Neuroimaging

## 2025-04-05 ENCOUNTER — Ambulatory Visit

## 2025-04-08 ENCOUNTER — Ambulatory Visit: Admitting: Internal Medicine

## 2025-05-06 ENCOUNTER — Ambulatory Visit

## 2025-05-31 ENCOUNTER — Ambulatory Visit

## 2025-06-06 ENCOUNTER — Ambulatory Visit

## 2025-07-07 ENCOUNTER — Ambulatory Visit

## 2025-08-07 ENCOUNTER — Ambulatory Visit

## 2025-09-05 ENCOUNTER — Ambulatory Visit: Admitting: Family Medicine

## 2025-09-07 ENCOUNTER — Ambulatory Visit

## 2025-10-08 ENCOUNTER — Ambulatory Visit

## 2025-11-08 ENCOUNTER — Ambulatory Visit

## 2026-01-09 ENCOUNTER — Ambulatory Visit
